# Patient Record
Sex: Male | Born: 1960 | Race: Asian | Hispanic: No | Marital: Married | Smoking: Former smoker
Health system: Southern US, Community
[De-identification: ages and names within clinical notes are randomized; demographics above are authoritative.]

## PROBLEM LIST (undated history)

## (undated) DIAGNOSIS — I10 Essential (primary) hypertension: Secondary | ICD-10-CM

## (undated) DIAGNOSIS — E785 Hyperlipidemia, unspecified: Secondary | ICD-10-CM

## (undated) DIAGNOSIS — I214 Non-ST elevation (NSTEMI) myocardial infarction: Secondary | ICD-10-CM

## (undated) DIAGNOSIS — T7840XA Allergy, unspecified, initial encounter: Secondary | ICD-10-CM

## (undated) DIAGNOSIS — E039 Hypothyroidism, unspecified: Principal | ICD-10-CM

## (undated) DIAGNOSIS — N529 Male erectile dysfunction, unspecified: Secondary | ICD-10-CM

## (undated) DIAGNOSIS — C3490 Malignant neoplasm of unspecified part of unspecified bronchus or lung: Secondary | ICD-10-CM

## (undated) DIAGNOSIS — I2510 Atherosclerotic heart disease of native coronary artery without angina pectoris: Secondary | ICD-10-CM

## (undated) DIAGNOSIS — J189 Pneumonia, unspecified organism: Secondary | ICD-10-CM

## (undated) DIAGNOSIS — G5690 Unspecified mononeuropathy of unspecified upper limb: Secondary | ICD-10-CM

## (undated) DIAGNOSIS — K760 Fatty (change of) liver, not elsewhere classified: Secondary | ICD-10-CM

## (undated) DIAGNOSIS — I255 Ischemic cardiomyopathy: Secondary | ICD-10-CM

## (undated) HISTORY — DX: Unspecified mononeuropathy of unspecified upper limb: G56.90

## (undated) HISTORY — DX: Hyperlipidemia, unspecified: E78.5

## (undated) HISTORY — DX: Pneumonia, unspecified organism: J18.9

## (undated) HISTORY — DX: Allergy, unspecified, initial encounter: T78.40XA

## (undated) HISTORY — PX: OTHER SURGICAL HISTORY: SHX169

## (undated) HISTORY — DX: Fatty (change of) liver, not elsewhere classified: K76.0

## (undated) HISTORY — DX: Male erectile dysfunction, unspecified: N52.9

## (undated) HISTORY — DX: Hypothyroidism, unspecified: E03.9

---

## 2007-08-19 ENCOUNTER — Emergency Department (HOSPITAL_COMMUNITY): Admission: EM | Admit: 2007-08-19 | Discharge: 2007-08-20 | Payer: Self-pay | Admitting: Emergency Medicine

## 2007-12-04 ENCOUNTER — Emergency Department (HOSPITAL_COMMUNITY): Admission: EM | Admit: 2007-12-04 | Discharge: 2007-12-04 | Payer: Self-pay | Admitting: Emergency Medicine

## 2010-04-18 ENCOUNTER — Encounter: Admission: RE | Admit: 2010-04-18 | Discharge: 2010-04-18 | Payer: Self-pay | Admitting: Internal Medicine

## 2010-09-05 ENCOUNTER — Encounter
Admission: RE | Admit: 2010-09-05 | Discharge: 2010-09-05 | Payer: Self-pay | Source: Home / Self Care | Attending: Internal Medicine | Admitting: Internal Medicine

## 2010-09-05 ENCOUNTER — Encounter: Payer: Self-pay | Admitting: Internal Medicine

## 2010-10-03 ENCOUNTER — Encounter: Payer: Self-pay | Admitting: Internal Medicine

## 2010-10-24 ENCOUNTER — Other Ambulatory Visit: Payer: Self-pay

## 2010-10-24 ENCOUNTER — Other Ambulatory Visit: Payer: Self-pay | Admitting: Internal Medicine

## 2010-10-24 ENCOUNTER — Encounter: Payer: Self-pay | Admitting: Internal Medicine

## 2010-10-24 ENCOUNTER — Institutional Professional Consult (permissible substitution) (INDEPENDENT_AMBULATORY_CARE_PROVIDER_SITE_OTHER): Payer: Self-pay | Admitting: Internal Medicine

## 2010-10-24 ENCOUNTER — Encounter (INDEPENDENT_AMBULATORY_CARE_PROVIDER_SITE_OTHER): Payer: Self-pay | Admitting: *Deleted

## 2010-10-24 DIAGNOSIS — R519 Headache, unspecified: Secondary | ICD-10-CM | POA: Insufficient documentation

## 2010-10-24 DIAGNOSIS — M542 Cervicalgia: Secondary | ICD-10-CM

## 2010-10-24 DIAGNOSIS — R51 Headache: Secondary | ICD-10-CM | POA: Insufficient documentation

## 2010-10-24 DIAGNOSIS — J381 Polyp of vocal cord and larynx: Secondary | ICD-10-CM | POA: Insufficient documentation

## 2010-10-24 DIAGNOSIS — R042 Hemoptysis: Secondary | ICD-10-CM

## 2010-10-24 DIAGNOSIS — F17200 Nicotine dependence, unspecified, uncomplicated: Secondary | ICD-10-CM

## 2010-10-24 DIAGNOSIS — E119 Type 2 diabetes mellitus without complications: Secondary | ICD-10-CM | POA: Insufficient documentation

## 2010-10-25 ENCOUNTER — Encounter: Payer: Self-pay | Admitting: Internal Medicine

## 2010-10-25 ENCOUNTER — Ambulatory Visit
Admission: RE | Admit: 2010-10-25 | Discharge: 2010-10-25 | Disposition: A | Discharge status: Home / Self Care | Payer: Self-pay | Source: Ambulatory Visit | Attending: Internal Medicine | Admitting: Internal Medicine

## 2010-10-25 ENCOUNTER — Ambulatory Visit (INDEPENDENT_AMBULATORY_CARE_PROVIDER_SITE_OTHER)
Admission: RE | Admit: 2010-10-25 | Discharge: 2010-10-25 | Disposition: A | Discharge status: Home / Self Care | Payer: Self-pay | Source: Ambulatory Visit | Attending: Internal Medicine | Admitting: Internal Medicine

## 2010-10-25 DIAGNOSIS — M542 Cervicalgia: Secondary | ICD-10-CM

## 2010-10-25 DIAGNOSIS — R042 Hemoptysis: Secondary | ICD-10-CM

## 2010-10-25 MED ORDER — IOHEXOL 300 MG/ML  SOLN
80.0000 mL | Freq: Once | INTRAMUSCULAR | Status: AC | PRN
  Administered 2010-10-25: 80 mL via INTRAVENOUS

## 2010-10-28 ENCOUNTER — Ambulatory Visit (INDEPENDENT_AMBULATORY_CARE_PROVIDER_SITE_OTHER): Payer: Self-pay | Admitting: Internal Medicine

## 2010-10-28 ENCOUNTER — Encounter: Payer: Self-pay | Admitting: Internal Medicine

## 2010-10-28 ENCOUNTER — Inpatient Hospital Stay (HOSPITAL_COMMUNITY)
Admission: AD | Admit: 2010-10-28 | Discharge: 2010-11-04 | DRG: 180 | Disposition: A | Discharge status: Home / Self Care | Payer: Medicaid Other | Source: Ambulatory Visit | Attending: Emergency Medicine | Admitting: Emergency Medicine

## 2010-10-28 DIAGNOSIS — Z79899 Other long term (current) drug therapy: Secondary | ICD-10-CM

## 2010-10-28 DIAGNOSIS — R042 Hemoptysis: Secondary | ICD-10-CM

## 2010-10-28 DIAGNOSIS — M542 Cervicalgia: Secondary | ICD-10-CM

## 2010-10-28 DIAGNOSIS — I2510 Atherosclerotic heart disease of native coronary artery without angina pectoris: Secondary | ICD-10-CM | POA: Diagnosis present

## 2010-10-28 DIAGNOSIS — C3400 Malignant neoplasm of unspecified main bronchus: Principal | ICD-10-CM | POA: Diagnosis present

## 2010-10-28 DIAGNOSIS — I7090 Unspecified atherosclerosis: Secondary | ICD-10-CM | POA: Insufficient documentation

## 2010-10-28 DIAGNOSIS — K5900 Constipation, unspecified: Secondary | ICD-10-CM | POA: Diagnosis present

## 2010-10-28 DIAGNOSIS — J189 Pneumonia, unspecified organism: Secondary | ICD-10-CM | POA: Diagnosis present

## 2010-10-28 DIAGNOSIS — R222 Localized swelling, mass and lump, trunk: Secondary | ICD-10-CM

## 2010-10-28 DIAGNOSIS — R51 Headache: Secondary | ICD-10-CM

## 2010-10-28 DIAGNOSIS — I10 Essential (primary) hypertension: Secondary | ICD-10-CM | POA: Diagnosis present

## 2010-10-28 DIAGNOSIS — M4802 Spinal stenosis, cervical region: Secondary | ICD-10-CM | POA: Diagnosis present

## 2010-10-28 DIAGNOSIS — E119 Type 2 diabetes mellitus without complications: Secondary | ICD-10-CM | POA: Diagnosis present

## 2010-10-28 DIAGNOSIS — Z87891 Personal history of nicotine dependence: Secondary | ICD-10-CM

## 2010-10-28 LAB — GLUCOSE, CAPILLARY
Glucose-Capillary: 165 mg/dL — ABNORMAL HIGH (ref 70–99)
Glucose-Capillary: 161 mg/dL — ABNORMAL HIGH (ref 70–99)

## 2010-10-28 LAB — COMPREHENSIVE METABOLIC PANEL
Albumin: 4.1 g/dL (ref 3.5–5.2)
Alkaline Phosphatase: 51 U/L (ref 39–117)
BUN: 13 mg/dL (ref 6–23)
Calcium: 9 mg/dL (ref 8.4–10.5)
Creatinine, Ser: 0.68 mg/dL (ref 0.4–1.5)
GFR calc non Af Amer: >60 mL/min (ref >60)
Potassium: 4.1 mEq/L (ref 3.5–5.1)
Sodium: 138 mEq/L (ref 135–145)

## 2010-10-28 LAB — CBC
HCT: 42 % (ref 39.0–52.0)
Hemoglobin: 14.2 g/dL (ref 13.0–17.0)
Platelets: 250 10*3/uL (ref 150–400)
WBC: 12.1 10*3/uL — ABNORMAL HIGH (ref 4.0–10.5)

## 2010-10-28 LAB — DIFFERENTIAL
Basophils Absolute: 0.1 10*3/uL (ref 0.0–0.1)
Eosinophils Relative: 4 % (ref 0–5)
Lymphocytes Relative: 25 % (ref 12–46)
Lymphs Abs: 3 10*3/uL (ref 0.7–4.0)
Monocytes Absolute: 0.7 10*3/uL (ref 0.1–1.0)

## 2010-10-28 LAB — APTT: aPTT: 33 seconds (ref 24–37)

## 2010-10-28 LAB — PROTIME-INR
INR: 0.97 (ref 0.00–1.49)
Prothrombin Time: 13.1 seconds (ref 11.6–15.2)

## 2010-10-29 ENCOUNTER — Other Ambulatory Visit: Payer: Self-pay | Admitting: Internal Medicine

## 2010-10-29 LAB — GLUCOSE, CAPILLARY

## 2010-10-29 LAB — EXPECTORATED SPUTUM ASSESSMENT W GRAM STAIN, RFLX TO RESP C

## 2010-10-29 LAB — URINALYSIS, ROUTINE W REFLEX MICROSCOPIC
Glucose, UA: 100 mg/dL — AB
Ketones, ur: NEGATIVE mg/dL
Urobilinogen, UA: 0.2 mg/dL (ref 0.0–1.0)
pH: 6.5 (ref 5.0–8.0)

## 2010-10-30 LAB — GLUCOSE, CAPILLARY
Glucose-Capillary: 147 mg/dL — ABNORMAL HIGH (ref 70–99)
Glucose-Capillary: 158 mg/dL — ABNORMAL HIGH (ref 70–99)
Glucose-Capillary: 220 mg/dL — ABNORMAL HIGH (ref 70–99)
Glucose-Capillary: 172 mg/dL — ABNORMAL HIGH (ref 70–99)

## 2010-10-31 ENCOUNTER — Other Ambulatory Visit: Payer: Self-pay | Admitting: Internal Medicine

## 2010-10-31 ENCOUNTER — Ambulatory Visit: Payer: Self-pay | Admitting: Internal Medicine

## 2010-10-31 ENCOUNTER — Inpatient Hospital Stay (HOSPITAL_COMMUNITY): Payer: Medicaid Other

## 2010-10-31 LAB — BASIC METABOLIC PANEL
BUN: 11 mg/dL (ref 6–23)
Calcium: 9 mg/dL (ref 8.4–10.5)
Chloride: 103 mEq/L (ref 96–112)
Creatinine, Ser: 0.8 mg/dL (ref 0.4–1.5)
GFR calc Af Amer: >60 mL/min (ref >60)
GFR calc non Af Amer: >60 mL/min (ref >60)

## 2010-10-31 LAB — CBC
MCH: 29.6 pg (ref 26.0–34.0)
MCHC: 34.2 g/dL (ref 30.0–36.0)
MCV: 86.5 fL (ref 78.0–100.0)
Platelets: 239 10*3/uL (ref 150–400)
RDW: 13.1 % (ref 11.5–15.5)

## 2010-10-31 LAB — PROTIME-INR: Prothrombin Time: 12.7 seconds (ref 11.6–15.2)

## 2010-10-31 LAB — GLUCOSE, CAPILLARY: Glucose-Capillary: 143 mg/dL — ABNORMAL HIGH (ref 70–99)

## 2010-10-31 NOTE — H&P (Signed)
NAMEATHANASIOS, HELDMAN                     ACCOUNT NO.:  [id number]  MEDICAL RECORD NO.:  [id number]           PATIENT TYPE:  I  LOCATION:  5013                         FACILITY:  [facility]  PHYSICIAN:  Saraphina Lauderbaugh D. Maple Hudson, MD, FCCP, FACPDATE OF BIRTH:  04[birth year]08[birth year]1961  DATE OF ADMISSION:  10/28/2010 DATE OF DISCHARGE:                             HISTORY & PHYSICAL   ADMITTING DIAGNOSES: 1. Hemoptysis. 2. Lung mass. 3. Facial pain.  HISTORY OF PRESENT ILLNESS:  This 50 year old [place] was seen on first visit, coming with his nephew who serves as Nurse, learning disability on October 24, 2010, with complaint of 3-4 months of intermittent cough with blood-streaked sputum and additional complaint of pain at the right anterior neck, extending up the right side of his face.  He had been treating these problems with traditional hot-cupping therapy, leaving burn marks on his back.  He had had an ear, nose, and throat evaluation by Dr. Narda Bonds who found an incidental vocal cord polyp and two papillomas on the soft palate, which were not thought to be bleeding source.  Chest x-ray as of September 15, 2010, had been normal.  Other laboratory was significant for elevated glucose in this diabetic, controlled on oral agents.  At that hospital visit with me, sputum was obtained for cytology, returning nondiagnostic.  Tramadol was given for pain, which has subsequently not provided pain relief.  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  CT scan of the neck showed arterial calcification generally, but also at the carotid bulb on the right with cervical spine stenosis at levels C2 through C4, but no direct tumor invasion in that area to explain the pain.  He has returned to the office today complaining of persistent pain and heavier bleeding, having coughed up two cups of red  blood over the last 24 hours.  He is admitted for stabilization and for further diagnostic evaluation as well as pain control.  HOME MEDICATIONS: 1. Tylenol 325 mg q.i.d. p.r.n. 2. Metformin hydrochloride 1000 mg b.i.d. 3. Tramadol 50 mg t.i.d. p.r.n. pain.  PAST MEDICAL HISTORY: 1. Diabetes mellitus type 2, adult-onset, non-insulin dependent. 2. Vocal cord polyp. 3. Recent hemoptysis. 4. Pain. 5. Tobacco user. 6. Atherosclerosis. 7. Cervical spine stenosis, C2 through C4.  FAMILY HISTORY:  He has no information about extended family who are all still in [place].  His nephew is with him.  SOCIAL HISTORY:  Married, [place], he has children. Working as a Investment banker, operational.  Current smoker since age 28, averaging one-half pack per day.  No alcohol.  He is with his nephew, Juleen Starr.  Mr. Maxie Better serves as a Nurse, learning disability.  VACCINATION/IMMUNIZATION HISTORY:  He says PPD has been negative in the past, and he is not aware of tuberculosis exposure.  He thinks he had a flu shot this year, but is unsure about pneumonia vaccine.  PAST SURGICAL HISTORY:  Unknown.  OBJECTIVE:  VITAL SIGNS:  This is a 50 year old man, height  67 inches, weight 162.38 pounds, oxygen saturation 94% on room air, pulse 74 and regular, and BP 162/100 left arm. GENERAL:  He appears to be stressed and uncomfortable.  He makes an attempt to be cooperative, recognizing significant language barrier, but appears to be alert and oriented. SKIN:  Circular burn marks of varying ages from hot cupping on his back and arms. ADENOPATHY:  None found at the cervical, supraclavicular, or axillary areas. HEENT:  Atraumatic.  Extraocular muscles are intact.  Gross hearing and vision appear intact.  Pupils are equal, round, and reactive to light and accommodation.  Conjunctivae are clear.  Tympanic membranes are clear.  Nose is clear.  Pharynx is clear.  Incidental small papilloma noted on the right soft palate.  Several  missing and stained teeth. NECK:  Normal movement and flexibility.  Neck veins are not distended. Carotid impulses are normal without bruits.  Thyroid is normal to palpation.  He indicates an area that is the focus of pain, at the right midlateral neck, approximately the area of the carotid bulb, but it is not particularly tender to gentle palpation and I feel no mass. CHEST:  Crackles in both bases with a few rhonchi, unlabored on the right.  He happens not to be coughing at all while he was in my exam room today and produced no blood. HEART:  Regular rhythm.  No murmur, gallop, or rub heard. ABDOMEN:  Soft and nontender.  Bowel sounds are normal.  I do not feel any enlargement of liver or spleen. GENITALIA AND RECTAL:  Not examined at this time, noncontributory. EXTREMITIES:  Warm with normal pulses.  No edema, cyanosis, or clubbing. NEUROLOGIC:  He moves all extremities, ambulates normally, and is grossly intact.  LABORATORY DATA:  Sputum cytology from October 25, 2010, showed atypical squamous cells and neutrophils.  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  There was postobstructive pneumonitis involving the right lower and right middle lobes and age-advanced coronary artery atherosclerosis.  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  IMPRESSION: 1. 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  Obstruction and collapse of the     distal right middle and lower lobe lung parenchyma are probably     imminent.  Hemoptysis is secondary to erosion of this cancer and     was worse over the last 24 hours.  Especially, given the weekend     and the communication barrier, it is appropriate to admit Mr. Laveda Norman     for inpatient care.  This will also allow [place] to set up bronchoscopy     at the first of the week. 2. His right neck and facial pain started about the same time, but     direct relation to his cancer is not yet apparent.  Tramadol has     been insufficient, so we will adjust analgesic  coverage as needed.     As we progress, I anticipate involving Oncology in his care     planning. 3. There is evidence of postobstructive pneumonia.  I will cover for     now as part of an effort to stabilize his respiratory problems by     utilizing Rocephin and Zithromax.  Because he is actively bleeding,     I will not provide pharmacological DVT prophylaxis and instead will     use PAS hose for now.     Tyquisha Sharps D. Maple Hudson, MD, FCCP, FACP     CDY/MEDQ  D:  10/28/2010  T:  10/29/2010  Job:  [id number]  cc:   Windle Guard, M.D.  Electronically Signed by Jetty Duhamel MD FCCP FACP on 10/31/2010 09:24:36 PM

## 2010-11-01 ENCOUNTER — Inpatient Hospital Stay (HOSPITAL_COMMUNITY): Payer: Medicaid Other

## 2010-11-01 DIAGNOSIS — C3490 Malignant neoplasm of unspecified part of unspecified bronchus or lung: Secondary | ICD-10-CM

## 2010-11-01 LAB — CULTURE, RESPIRATORY W GRAM STAIN: Culture: NORMAL

## 2010-11-01 LAB — GLUCOSE, CAPILLARY
Glucose-Capillary: 115 mg/dL — ABNORMAL HIGH (ref 70–99)
Glucose-Capillary: 174 mg/dL — ABNORMAL HIGH (ref 70–99)
Glucose-Capillary: 149 mg/dL — ABNORMAL HIGH (ref 70–99)

## 2010-11-01 MED ORDER — IOHEXOL 300 MG/ML  SOLN
100.0000 mL | Freq: Once | INTRAMUSCULAR | Status: AC | PRN
  Administered 2010-11-01: 100 mL via INTRAVENOUS

## 2010-11-02 ENCOUNTER — Inpatient Hospital Stay (HOSPITAL_COMMUNITY): Payer: Medicaid Other

## 2010-11-02 LAB — GLUCOSE, CAPILLARY
Glucose-Capillary: 172 mg/dL — ABNORMAL HIGH (ref 70–99)
Glucose-Capillary: 208 mg/dL — ABNORMAL HIGH (ref 70–99)
Glucose-Capillary: 225 mg/dL — ABNORMAL HIGH (ref 70–99)

## 2010-11-03 ENCOUNTER — Ambulatory Visit (HOSPITAL_COMMUNITY): Payer: Medicaid Other | Attending: Oncology

## 2010-11-03 ENCOUNTER — Encounter (HOSPITAL_COMMUNITY): Payer: Self-pay | Admitting: Radiology

## 2010-11-03 ENCOUNTER — Ambulatory Visit: Payer: Self-pay | Admitting: Internal Medicine

## 2010-11-03 DIAGNOSIS — R222 Localized swelling, mass and lump, trunk: Secondary | ICD-10-CM

## 2010-11-03 DIAGNOSIS — C771 Secondary and unspecified malignant neoplasm of intrathoracic lymph nodes: Secondary | ICD-10-CM | POA: Insufficient documentation

## 2010-11-03 DIAGNOSIS — C3400 Malignant neoplasm of unspecified main bronchus: Secondary | ICD-10-CM | POA: Insufficient documentation

## 2010-11-03 LAB — GLUCOSE, CAPILLARY
Glucose-Capillary: 149 mg/dL — ABNORMAL HIGH (ref 70–99)
Glucose-Capillary: 193 mg/dL — ABNORMAL HIGH (ref 70–99)

## 2010-11-03 MED ORDER — FLUDEOXYGLUCOSE F - 18 (FDG) INJECTION
18.1000 | Freq: Once | INTRAVENOUS | Status: AC | PRN
  Administered 2010-11-03: 18.1 via INTRAVENOUS

## 2010-11-03 NOTE — Letter (Signed)
Summary: Narda Bonds MD  Narda Bonds MD   Imported By: Lennie Odor 10/26/2010 13:51:14  _____________________________________________________________________  External Attachment:    Type:   Image     Comment:   External Document

## 2010-11-03 NOTE — Letter (Signed)
Summary: [employer]  [address]   [place], [place] [address]   Phone: [phone]  Fax: [phone]    Today's Date: October 24, 2010  Name of Patient: George Griffin  The above named patient had a medical visit today at:  1:30pm.  Please take this into consideration when reviewing the time away from work/school.    Special Instructions: Pt also has an appt on 10-25-2010 at 3pm  and 10-31-2010 at 11am.   [  ] None  [  ] To be off the remainder of today, returning to the normal work / school schedule tomorrow.  [  ] To be off until the next scheduled appointment on ______________________.  [  ] Other ________________________________________________________________ ________________________________________________________________________   Sincerely yours,     Jason Coop

## 2010-11-03 NOTE — Assessment & Plan Note (Signed)
Summary: coughing blood/mhh   Visit Type:  Initial Consult Copy to:  Dr. Deniece Ree Primary Provider/Referring Provider:  Dr. Deniece Ree  CC:  Hemoptysis.  History of Present Illness: October 24, 2010-   Current Medications (verified): 1)  Tylenol 325 Mg Tabs (Acetaminophen) .... Per Bottle Directions As Needed 2)  Metformin Hcl 1000 Mg Tabs (Metformin Hcl) .Marland Kitchen.. 1 Two Times A Day  Allergies (verified): No Known Drug Allergies  Past History:  Past Medical History: Current Problems:  DM (ICD-250.00) VOCAL CORD POLYP (ICD-478.4) HEMOPTYSIS UNSPECIFIED (ICD-786.30)  Social History: Married Children Current smoker since age 43.  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 No obvious source on ENT exam. The patient thinks blood is coming from his head, saying sometimes it comes from his nose, but pointing to the area that hurts in right neck. If it can be financed, I would like to get contrast CT of neck and chest.  Orders: Consultation Level III (29562) T- * Misc. Laboratory test [phone]) Radiology Referral (Radiology)  Problem # 2:  FACIAL PAIN (ICD-784.0)  Cephalgia with suggestion it following nerve distribution, perhaps from area of his carotid in right neck. I will give Rx for tramadol to help cough and pain, and ask he stop Advil.  His updated medication list for this problem includes:    Tylenol 325 Mg Tabs (Acetaminophen) .Marland Kitchen... Per bottle directions as needed    Tramadol Hcl 50 Mg Tabs (Tramadol hcl) .Marland Kitchen... 1, 3 tim3s daily as needed for  pain  Orders: Consultation Level III ([id number]) Radiology Referral (Radiology)  Problem # 3:  TOBACCO ABUSE (ICD-305.1)  Encouraged to stop smoking.l   Orders: Consultation Level III (96295) T- * Misc. Laboratory test [phone]) Radiology Referral (Radiology)  Medications Added to Medication List This Visit: 1)  Tylenol 325 Mg Tabs (Acetaminophen) .... Per bottle directions as needed 2)  Metformin Hcl 1000 Mg Tabs (Metformin hcl) .Marland Kitchen.. 1 two times a day 3)  Tramadol Hcl 50 Mg Tabs (Tramadol hcl) .... 1, 3 tim3s daily as needed for pain  Patient Instructions: 1)  Please schedule a follow-up appointment in 1 week. 2)  See [facility] to schedule CT scans 3)  A Chest CT with Contrast has been recommended.  Your imaging study may require preauthorization.  4)  Stop Advil, Stop smoking 5)  Script for Tramadol for pain 6)  We will send sputum to lab for cytology Prescriptions: TRAMADOL HCL 50 MG TABS (TRAMADOL HCL) 1, 3 tim3s daily as needed for pain  #25 x 1   Entered and Authorized by:   Waymon Budge MD   Signed by:   Waymon Budge MD on 10/24/2010   Method used:   Print then Give to Patient   RxID:   [id number]   Appended Document: Orders Update    Clinical Lists Changes  Orders: Added new Test order of T- * Misc.  Laboratory test [phone]) - Signed      Appended Document: coughing blood/mhh     Copy to:  Dr Windle Guard Primary Provider/Referring Provider:  Dr. 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  Originally from [place], has been in [place] 22 years, but speaks very limited [place]. Lifelong smoker, now at 10 cigarettes/ day. Denies Hx TB and says PPD negative. Denies prior lung disease.  Now for 3-4 months he has had cough intermittently producing clear to yellow mucus and blood streaks. Denies fever, night sweats, chest pain, palpitation, weight loss, other  bleeding, dyspnea, dysphagia or nodes.  At about the same time he bgan noting pain focused in the right anterior neck, lateral to the midtrachea. Pain is intermittent, several times daily, and extending up the side of his head, sometimes into the right eye without visual change and into the right ear without hearing change. He has been taking "a lot" of Advil for this pain. He has also been doing traditional therapy with hot cupping which leaves numerous burn marks on his back and arms. ENT eval by Dr Ezzard Standing found incidental vocal cord polyp and 2 papillomas on soft palate. These were thought to be benign and not the bleeding source.  CXR 09/05/10 was WNL. CBC and BMET 10/04/10 were normal except glucose 144- known DM on oral Rx.  He coughs sample bloody mucoid sputum which is sent for cytology.   Preventive Screening-Counseling & Management  Alcohol-Tobacco     Smoking Status: current     Smoke Cessation Stage: precontemplative     Packs/Day: 0.25     Tobacco Counseling: to quit use of tobacco products  Allergies: No Known Drug Allergies  Past History:  Family History: Last updated: 10/24/2010 He has no information  Social History: Last updated: 10/24/2010 Married Children Current smoker since age 13.  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  The patient denies shortness of breath with activity, shortness of breath at rest, non-productive cough, chest pain, irregular heartbeats, acid heartburn, indigestion, loss of appetite, weight  change, abdominal pain, difficulty swallowing, sore throat, tooth/dental problems, headaches, nasal congestion/difficulty breathing through nose, sneezing, ear ache, rash, and fever.         Facial pain/ carotidynia per HPI  Physical Exam  Additional Exam:  Vital Signs: Patient profile:   50 year old male Height:      67 inches Weight:      166.13 pounds BMI:     26.11 O2 Sat:      94 % on Room air Pulse rate:   88 / minute BP sitting:   130 / 80  (left arm)  Vitals Entered By: Vernie Murders (October 24, 2010 1:41 PM)  O2 Flow:  Room air  PHYSICAL EXAM: General: A/Ox3; pleasant and cooperative, NAD, SKIN: Circular burn marks of varying age from hot cupping on back and arms NODES: no lymphadenopathy HEENT: [place]/AT, EOM- WNL, Conjuctivae- clear,  PERRLA, TM-WNL, Nose- clear, Throat- clear and wnl, throat clearing. Missing teeth. Incidental small papilloma on right soft palate NECK: Supple w/ fair ROM, JVD- none, normal carotid impulses w/o bruits Thyroid- normal to palpation. Points to mid right neck as source of pain.  Not tender, no mass, bruit or pulsation.  CHEST: crackles both bases, unlabored HEART: RRR, no m/g/r heard ABDOMEN: Soft and nl; nml bowel sounds; no organomegaly or masses noted ZOX:WRUE, nl pulses, no edema, cyanosis or clubbing NEURO: Grossly intact to observation      Impression & Recommendations:  Impression & Recommendations:  Problem # 1:  HEMOPTYSIS UNSPECIFIED (ICD-786.30)  Hemoptysis x 3-4 months in smoker taking a lots of Advil. No obvious source on ENT exam. The patient thinks blood is coming from his head, saying sometimes it comes from his nose, but pointing to the area that hurts in right neck. If it can be financed, I would like to get contrast CT of neck and chest.  Orders: Consultation Level III (45409) T- * Misc. Laboratory test [phone]) Radiology Referral (Radiology)  Problem # 2:  FACIAL PAIN (ICD-784.0)  Cephalgia with suggestion it  following nerve distribution, perhaps from area of his carotid in right neck. I will give Rx for tramadol to help cough and pain, and ask he stop Advil.  His updated medication list for this problem includes:    Tylenol 325 Mg Tabs (Acetaminophen) .Marland Kitchen... Per bottle directions as needed    Tramadol Hcl 50 Mg Tabs (Tramadol hcl) .Marland Kitchen... 1, 3 tim3s daily as needed for pain  Orders: Consultation Level III ([id number]) Radiology Referral (Radiology)  Problem # 3:  TOBACCO ABUSE (ICD-305.1)  Encouraged to stop smoking.l   Orders: Consultation Level III (56213) T- * Misc. Laboratory test [phone]) Radiology Referral (Radiology)  Medications Added to Medication List This Visit: 1)  Tylenol 325 Mg Tabs (Acetaminophen) .... Per bottle directions as needed 2)  Metformin Hcl 1000 Mg Tabs (Metformin hcl) .Marland Kitchen.. 1 two times a day 3)  Tramadol Hcl 50 Mg Tabs (Tramadol hcl) .... 1, 3 tim3s daily as needed for pain  Patient Instructions: 1)  Please schedule a follow-up appointment in 1 week. 2)  See [facility] to schedule CT scans 3)  A Chest CT with Contrast has been recommended.  Your imaging study may require preauthorization.  4)  Stop Advil, Stop smoking 5)  Script for Tramadol for pain 6)  We will send sputum to lab for cytology Prescriptions: TRAMADOL HCL 50 MG TABS (TRAMADOL HCL) 1, 3 tim3s daily as needed for pain  #25 x 1   Entered and Authorized by:   Waymon Budge MD   Signed by:   Rennis Chris You

## 2010-11-03 NOTE — Assessment & Plan Note (Signed)
Summary: 1 week follow up visit/per CDY/kcw  RESCHED TODAY PER KATIE/LED   Copy to:  Dr Windle Guard Primary Provider/Referring Provider:  Dr. Windle Guard  CC:  Hemoptysis-rich red blood;Increased pain in face; coughing up approx 2 cups of blood; dizzy and lightheaded feelings..  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  Originally from [place], has been in [place] 22 years, but speaks very limited [place]. Lifelong smoker, now at 10 cigarettes/ day. Denies Hx TB and says PPD negative. Denies prior lung disease.  Now for 3-4 months he has had cough intermittently producing clear to yellow mucus and blood streaks. Denies fever, night sweats, chest pain, palpitation, weight loss, other bleeding, dyspnea, dysphagia or nodes.  At about the same time he bgan noting pain focused in the right anterior neck, lateral to the midtrachea. Pain is intermittent, several times daily, and extending up the side of his head, sometimes into the right eye without visual change and into the right ear without hearing change. He has been taking "a lot" of Advil for this pain. He has also been doing traditional therapy with hot cupping which leaves numerous burn marks on his back and arms. ENT eval by Dr Ezzard Standing found incidental vocal cord polyp and 2 papillomas on soft palate. These were thought to be benign and not the bleeding source.  CXR 09/05/10 was WNL. CBC and BMET 10/04/10 were normal except glucose 144- known DM on oral Rx.  He coughs sample bloody mucoid sputum which is sent for cytology.   October 28, 2010- Right facial pain, hemoptysis, lung mass- nephew with him Nurse-CC: Hemoptysis-rich red blood;Increased pain in face; coughing up approx 2 cups of blood; dizzy, lightheaded feelings. Heavier bleeding  began yesterday evening. Light headed, dizzy. Tramadol  did not relieve pain which is still mainly in right neck and face. CT chest- mass c/w bronchogenic ca RLL into carina and hilar nodes and bronchus intermedius. CT neck- arterial calcification at carotic bnulb and generally. Cervical stenosis.     Preventive Screening-Counseling & Management  Alcohol-Tobacco     Smoking Status: quit     Smoke Cessation Stage: precontemplative     Packs/Day: 0.25     Year Quit: 5 days ago     Tobacco Counseling: to quit use of tobacco products  Current Medications (verified): 1)  Tylenol 325 Mg Tabs (Acetaminophen) .... Per Bottle Directions As Needed 2)  Metformin Hcl 1000 Mg Tabs (Metformin Hcl) .Marland Kitchen.. 1 Two Times A Day 3)  Tramadol Hcl 50 Mg Tabs (Tramadol Hcl) .... 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  Smoked 1/2 ppd. 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  Smoked 1/2 ppd. 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   Physical Exam  Additional Exam:   PHYSICAL EXAM: General: A/Ox3; pleasant and cooperative, NAD, but tired and stressed looking SKIN: Circular burn marks of varying age from hot cupping on back and arms NODES: no lymphadenopathy HEENT: [place]/AT, EOM- WNL, Conjuctivae- clear, PERRLA, TM-WNL, Nose- clear, Throat- clear and wnl, throat clearing. Missing teeth. Incidental small papilloma on right soft palate NECK: Supple w/ fair ROM, JVD- none, normal carotid impulses w/o bruits Thyroid- normal to palpation. Points to mid right neck as source of pain.  Not tender, no mass, bruit or pulsation.  CHEST: crackles both bases, few rhonchi on right, unlabored HEART: RRR, no m/g/r heard ABDOMEN: Soft and nl; nml bowel sounds; no organomegaly or masses noted EXB:MWUX, nl pulses, no edema, cyanosis or clubbing NEURO: Grossly intact to observation      Impression & Recommendations:  Problem # 1:  MASS, LUNG (ICD-786.6) By chest CT this is a bronchogenic cancer in Right mid lung, invading hilar nosdes and bronchus intermedius, with active hemoptysis and potential for full occlusion of airway.  We wil admit for stabilization of hemoptysis and for apain control and diagnostic management.  Problem # 2:  FACIAL PAIN (ICD-784.0) Pain may be unrelated to the lung mas and mor related to arterial calcification seen near the carotid bulb,, or even to his C2-4 spinal stenosis.  His updated medication list for this problem includes:    Tylenol 325 Mg Tabs (Acetaminophen) .Marland Kitchen... Per bottle directions as needed    Tramadol Hcl 50 Mg Tabs (Tramadol hcl) .Marland Kitchen... 1, 3 tim3s daily as needed for pain  Patient Instructions: 1)  In-patient admission to hpospital. for primary dx hempotpysis, lung mass, pain.

## 2010-11-04 LAB — CBC
HCT: 42.5 % (ref 39.0–52.0)
Hemoglobin: 14.4 g/dL (ref 13.0–17.0)
MCH: 29 pg (ref 26.0–34.0)
MCHC: 33.9 g/dL (ref 30.0–36.0)

## 2010-11-04 LAB — DIFFERENTIAL
Basophils Relative: 1 % (ref 0–1)
Lymphocytes Relative: 19 % (ref 12–46)
Monocytes Absolute: 0.8 10*3/uL (ref 0.1–1.0)
Monocytes Relative: 7 % (ref 3–12)
Neutro Abs: 7.9 10*3/uL — ABNORMAL HIGH (ref 1.7–7.7)

## 2010-11-04 LAB — COMPREHENSIVE METABOLIC PANEL
BUN: 12 mg/dL (ref 6–23)
CO2: 27 mEq/L (ref 19–32)
Calcium: 9.3 mg/dL (ref 8.4–10.5)
Creatinine, Ser: 0.8 mg/dL (ref 0.4–1.5)
GFR calc non Af Amer: >60 mL/min (ref >60)
Glucose, Bld: 167 mg/dL — ABNORMAL HIGH (ref 70–99)

## 2010-11-04 LAB — GLUCOSE, CAPILLARY: Glucose-Capillary: 233 mg/dL — ABNORMAL HIGH (ref 70–99)

## 2010-11-04 NOTE — Consult Note (Signed)
NAMECLEM, WISENBAKER                     ACCOUNT NO.:  [id number]  MEDICAL RECORD NO.:  [id number]           PATIENT TYPE:  I  LOCATION:  5013                         FACILITY:  [facility]  PHYSICIAN:  Jethro Bolus, M.D.          DATE OF BIRTH:  October 16, 1960  DATE OF CONSULTATION:  11/01/2010 DATE OF DISCHARGE:                                CONSULTATION   REFERRING PHYSICIAN:  Clinton D. Young, MD, FCCP, FACP  REASON FOR CONSULTATION:  At least stage IIIA non-small-cell lung cancer; squamous cell subtype.  HISTORY OF PRESENT ILLNESS:  Mr. Hoglund is a 50 year old [place] gentleman who had history of smoking when he was 50 years old between one and two packs a day.  He recently quit smoking the last few days.  He has developing intermittent hemoptysis the past 5-6 months. First, he thought it was problem with tonsil infection as he has had recurrent problem with sore throat.  Despite courses of antibiotics, he still has hemoptysis.  He was eventually referred to Dr. Jetty Duhamel from Pulmonary Service who previously did a chest x-ray on April 18, 2010 and September 05, 2010 which are both negative.  He also did sputum cytology and was reported negative as well.  Given the persistent problem, he was admitted, he underwent a chest CT on October 25, 2010, which I reviewed myself personally, which showed a 3.7 x 3.9-cm right by 4.3 cm right central lower lobe lung mass involving the right lower lobar and segmental bronchi.  He also had reticular nodular opacities within the right middle lobe and right lower lobe consistent with postobstructive pneumonitis.  He still has a 2.3 cm subcarinal adenopathy and right hilar adenopathy.  Given the fact that he has right neck pain, a neck CT was performed on the same day, which showed mucosal thinning of the maxillary sinuses, right more than left.  He has jugular chain lymph node with adenopathy.  He has carotid bulb atherosclerosis. 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  There was no explanations on the CT of the neck to be concern for cancer spreading. Given the problem with hemoptysis, he was admitted to the hospital on October 28, 2010 and underwent on October 31, 2010 a bronchoscopy, which showed the right bronchus which was significant for occlusive tumor approximately 3 cm distal to the main carina.  There was brush from cytology and there is a history of washing and biopsy for transbronchial biopsy technique.  Pathology case number [id number] was consistent with malignant cell present most likely squamous cell carcinoma on cytology and surgical pathology case number [id number] was consistent with squamous cell carcinoma from the right mainstem bronchus, biopsy. Given the language barrier, he was referred to me for evaluations and treatment options.  Mr. Weatherford was seen in the hospital room by himself today.  He reports that he is feeling better when first admitted.  His hemolysis is resolving and is now only lemonade tinge without frank hemoptysis.  He has been a  bit ambulatory inside the hospital room without any shortness of breath or chest pain.  He still has problem with right neck pain radiating to the right occipital area, that is intermittent; however, the symptom is much improved with fentanyl patch and analgesics as needed using oxycodone.  He denies any headache, confusion, diplopia, blurred vision, residual sore throat, "laryngitis or tonsillitis."  He also denies palpable lymph node swelling in the neck, supraclavicular area or swelling of his face, shortness of breath, PND, orthopnea, pedal edema, chest pain, palpitation, abdominal pain, abdominal swelling, nausea, vomiting, hemoptysis, hematemesis, melena, hematochezia, hematuria, heat and cold intolerance, depression, homicidal, suicidal ideations or feeling  hopelessness.  The rest of the 14-point review of systems was negative.  PAST MEDICAL HISTORY: 1. Hypertension. 2. Diabetes mellitus type 2.  PAST SURGICAL HISTORY:  None.  CURRENT INPATIENT MEDICATIONS: 1. Azithromycin 500 mg p.o. daily. 2. Ceftriaxone 1 g IV daily. 3. Fentanyl patch 50 mg per day, change every 3 days. 4. Insulin per sliding scale. 5. Pantoprazole 40 mg p.o. daily. 6. Oxycodone 10 mg p.o. q.3 h. pain breakthrough pain.  ALLERGIES:  No known drug allergy.  SOCIAL HISTORY:  The patient is immigrant from [place].  He left [place] in [place] and arrived in the [place] in 1989.  He has been married twice.  He has four children, age 96 down to 39.  All other children either are independent, all live with their mothers.  He does have a supportive network with most of the children.  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  He reports history of smoking cigarette between one and two packs a day since when he was 14 years off and on; however, quit no more than 6 months at a time and his last smoke was before this admission.  He denies history of alcohol or IV drug use. His face is beautiful.  FAMILY HISTORY:  His parents died from unknown cause.  He has two sisters and four brothers.  Two other sisters had died from unknown cause.  He denies any family history of cancer including lung, colorectal, prostate cancer.  PHYSICAL EXAMINATION:  VITAL SIGNS:  Temperature 98.4 degrees Fahrenheit, heart rate 72, respiratory rate 18, blood pressure is 162/81, O2 sat 96% on room air.  ECOG performance status of 0. GENERAL:  Well-nourished male in no acute distress. HEENT:  There was no scleral icterus.  There was no oropharyngeal lesion.  I did not appreciate any tonsillar fossa, erythema or swelling. LYMPHATICS:  Negative for cervical, postauricular, supraclavicular, axillary, inguinal adenopathy. LUNGS:  Clear bilaterally without wheezing or crackle. CARDIAC:  Regular  rate and rhythm, S1 and S2 without murmur, rum or gallop. ABDOMEN:  Soft, flat, nontender, nondistended without organomegaly. EXTREMITIES:  There was no pedal edema. SKIN:  Did not show any skin rash. NEUROLOGIC:  Cranial nerves II through XII grossly intact.  Motor strength was 5/5 both upper and lower extremities.  Deep tendon reflexes within normal range.  LABORATORY DATA:  WBC 12.7, hemoglobin 14.5, platelet count of 239. Creatinine 0.80.  LFTs on October 28, 2010 was within normal range. Calcium was 9.0.  ASSESSMENT AND PLAN: 1. At least T2a, N1, MX non-cell lung cancer, squamous cell subtype. 2. History of smoking, recently quit. 3. Diabetes mellitus type 2. 4. Hypertension. 5. Right neck and occipital headache. 6. Hemoptysis secondary to lung cancer, resolving. 7. Postobstructive pneumonia.  RECOMMENDATIONS: 1. I discussed with Mr. Kostick that he at least stage III recommend  staging workup.  He does have history of smoking and therefore,     I wonder whether his lung function is good enough for pneumonectomy.     It made the fact that his tumor involving the right mainstem     bronchus, the curative surgical option if he is a candidate for     that is pneumonectomy.  I will get staging workup and PFT before     involving CVTS.  If he is not a candidate for pneumonectomy or has     significant disease, then he would benefit from concurrent     chemoradiation therapy with such regimen as weekly carboplatin and     Taxol.  I discussed with him side effects of his chemo regimen     which include, but limited to nausea, vomiting, cytopenia, hair     loss, mucositis, infusion reaction, pneumonitis, neuropathy, risk     of bleeding, infection, electrolyte abnormality.  He expressed     informed consent and wished to proceed with most aggressive form of     therapy possible that is he can withstand. 2. Staging head CT with and without IV contrast; ordered. 3. PET scan to help  with the treatment planning; ordered. 4. Pulmonary function test with DLCO and spirometry to see if he is     candidate for pneumonectomy; ordered 5. I will discuss the case at the Tumor Board on November 03, 2010. 6. Agree with empiric antibiotic for obstructive pneumonia. 7. Please keep the patient inpatient status until he is declared     whether or not he is a candidate for pneumonectomy as to facilitate     his treatment.     Jethro Bolus, M.D.     HH/MEDQ  D:  11/01/2010  T:  11/02/2010  Job:  [id number]  Electronically Signed by Jethro Bolus MD on 11/04/2010 07:00:44 AM

## 2010-11-07 NOTE — Discharge Summary (Addendum)
George Griffin, George Griffin                     ACCOUNT NO.:  [id number]  MEDICAL RECORD NO.:  [id number]           PATIENT TYPE:  I  LOCATION:  5013                         FACILITY:  [facility]  PHYSICIAN:  Clinton D. Maple Hudson, MD, FCCP, FACPDATE OF BIRTH:  1961/08/13  DATE OF ADMISSION:  10/28/2010 DATE OF DISCHARGE:  11/04/2010                              DISCHARGE SUMMARY   CONSULTANTS: 1. Jethro Bolus, MD 2. Dr. Tyrone Sage.  DISCHARGE DIAGNOSES: 1. Squamous cell lung cancer. 2. Post obstructive pneumonia. 3. Hemoptysis. 4. Diabetes mellitus, type 2. 5. Hypertension. 6. Neck pain. 7. Atherosclerosis. 8. Pain control. 9. Constipation.  LABORATORY DATA:  November 04, 2010, CMP demonstrates sodium 134, potassium 3.3, chloride 97, CO2 of 27, glucose 167, BUN 12, creatinine 0.80. Bilirubin 0.5, alkaline phosphatase 62, AST 18, ALT 23, total protein 7.6, albumin 3.8, calcium 9.3.  November 04, 2010, CBC demonstrates WBC 11.9, hemoglobin 14.4, hematocrit 42.5, platelet count 253, neutrophils 66%, lymphocytes 19%, monocytes 7%, eosinophils 7%, basophils 1.  MICRO DATA:  October 29, 2010, respiratory culture demonstrates normal oropharyngeal flora.  October 29, 2010, sputum demonstrates no acid-fast bacilli seen on preliminary results.  Final results pending.  October 29, 2010, urinalysis is negative.  RADIOLOGIC DATA:  November 01, 2010, CT of the head demonstrates no acute intracranial abnormality and chronic sinusitis.  IMAGING STUDY:  Nuclear med PET scan on November 03, 2010, demonstrates hypermetabolic right hilar mass consistent with primary bronchogenic carcinoma, mass invades the right lower lobe bronchus and there is post obstructive pneumonia/pneumonitis in the right lower lobe and right middle lobe.  Mediastinal metastasis to the subcarinal lymph node. Staging by imaging is T2, N2, M0.  PROCEDURES: 1. PFTs on November 02, 2010, demonstrate moderate obstructive and     restrictive disease with submaximal effort.   Normal diffusion     capacity.  Normal measured total lung capacity.  Insignificant     response bronchodilators. 2. Bronchoscopy on October 31, 2010, by Dr. Maple Hudson, which demonstrates an     occlusive bronchogenic carcinoma of the right main stem bronchus.  HISTORY OF PRESENT ILLNESS:  Mr. Oberman is a 50 year old [place] gentleman with little English speaking ability who had a past medical history of extensive smoking up to 2 packs per day since he was 50 years of age.  He was admitted by Dr. Maple Hudson on October 28, 2010, with 3 to 50-month complaint of intermittent cough with blood-streaked sputum and additional complain of right anterior neck pain.  As an outpatient, Mr. Marken had been treating these problems with hot cupping therapy leaving burn marks on his back.  He did have an ear, nose, and throat evaluation by Dr. Ezzard Standing who found an incidental vocal cord polyp and two papillomas on the soft palate, which were not thought to be the source of bleeding.  A chest x-ray on September 15, 2010, was normal.  He was admitted for tissue diagnosis and hemoptysis.  A CT scan of the chest demonstrated a mass consistent with a bronchogenic carcinoma in the right hilum extending into the carina and hilar nodes and into  the bronchus intermedius.  CT of the neck also demonstrated arterial calcification, but also specific calcification at the carotid bulb on the right with cervical spine stenosis at level C2 through C4.  There was no direct tumor invasion in that area to explain his neck pain.  He was seen in a Pulmonary office on October 28, 2010, with complaints of persistent pain and heavier hemoptysis.  Thus, he was admitted for further diagnostic evaluation and pain control.  Dr. Gaylyn Rong was consulted during hospital course.  He was evaluated by Dr. Gaylyn Rong as well as an evaluation by Dr. Tyrone Sage for possible surgical candidacy.  It was felt that he is not an initial surgical candidate for pneumonectomy  due to the impending obstruction of bronchus intermediate.  Recommended he undergo chemoradiation prior to any surgical intervention.  Pathology from bronchoscopy was consistent with malignant cells most likely squamous cell carcinoma on cytology and surgical pathology was consistent with squamous cell carcinoma of right main stem bronchus biopsy.  HOSPITAL COURSE BY DISCHARGE DIAGNOSES: 1. Squamous cell lung cancer.  Mr. Ashmead is a 50 year old [place]     male who was being worked up as an outpatient for hemoptysis.  With     worsening of symptoms on October 28, 2010, he was admitted for     hemoptysis workup and pain control.  He did undergo a bronchoscopy     on October 31, 2010, which demonstrated squamous cell carcinoma on     biopsy.  He was evaluated by Dr. Gaylyn Rong for further oncologic     recommendations.  He also was evaluated by Dr. Tyrone Sage for     possible surgical candidacy; however, secondary to location and     size of mass, it was recommended that he undergo chemotherapy and     radiation prior to any surgical intervention. 2. Post obstructive pneumonia secondary to squamous cell lung cancer.     Mr. Diop has completed 8 days of IV antibiotics.  At the time of     discharge, he will not be discharged on any further antibiotic     therapy. 3. Hemoptysis.  Currently, this has resolved.  Mr. Elk was admitted     with a 3 to 50-month history of coughing up secretions with streaks     of blood.  Please see above information for further details     regarding hemoptysis. 4. Diabetes mellitus, type 2.  He will continue on Janumet post     discharge. 5. Hypertension.  During the hospital course, Mr. Arneson was initiated     on Cardizem and hydrochlorothiazide; however, at the time of     discharge secondary to cancer diagnosis and bordering low blood     pressures, he will not be discharged on any blood pressure     medication.  Recommended that this be followed up at his      [facility] appointment. 6. Neck pain.  Mr. Stiefel has had a 3 to 50-month history of right-sided     neck pain.  During hospitalization, he did undergo CT of the     cervical spine, which demonstrated C2-C4 stenosis.  There was no     clear source of face and neck pain, thought likely secondary to     cervical spine stenosis. 7. Atherosclerosis. 8. Pain control.  Mr. Orvis was started on oxycodone IR as well as a     fentanyl patch during the hospital course.  He will follow up with  Dr. Gaylyn Rong at the [facility] and also has appointments made     to be establish at [facility].  He will be given pain control     regimen post discharge as well as a bowel regimen. 9. Constipation.  This is in the setting of narcotic use.  He will be     given daily Colace as well as p.r.n. laxative at the time of     discharge.  DISCHARGE INSTRUCTIONS: 1. Activity.  He is instructed to increase activity slowly and as     tolerated. 2. Diabetic diet.  No concentrated sweet. 3. Followup.  He is to follow up with [facility] for his eligibility     appointment on Friday, November 25, 2010, at 2:00 p.m.  He also is     scheduled for [facility] appointment on Tuesday, December 13, 2010,     at 9:15 a.m.  Dr. Gaylyn Rong from Oncology will call Mr. Ysaguirre for an     appointment.  DISCHARGE MEDICATIONS: 1. Fentanyl patch 75 mcg transdermally every 3 days. 2. Oxycodone 5 mg IR tabs, 10 mg by mouth q.3 h. as needed for pain. 3. Cialis 20 mg 1 tablet by mouth daily as needed for sexual     dysfunction. 4. Janumet 1 tablet by mouth twice daily, dosing was 50/1000 mg 5. Tramadol 50 mg 1 tablet by mouth 3 times daily as needed for pain. 6. Tylenol OTC every 6 hours as needed for pain. 7. Colace 100 mg p.o. daily. 8. Lactulose 10 g/15 mL p.o. daily p.r.n. constipation.  DISPOSITION AT THE TIME OF DISCHARGE:  Mr. Wellen has maximum benefit of inpatient therapy.  He is currently medically stable and clear for discharge  pending followup as above.     Canary Brim, NP   ______________________________ Rennis Chris. Maple Hudson, MD, FCCP, FACP    BO/MEDQ  D:  11/04/2010  T:  11/05/2010  Job:  [id number]  cc:   Jethro Bolus, M.D. Dr. Maple Hudson  Electronically Signed by Jetty Duhamel MD [facility] FACP on 11/07/2010 08:09:52 PM Electronically Signed by Canary Brim  on 11/08/2010 09:35:29 AM

## 2010-11-07 NOTE — Op Note (Signed)
  George Griffin, George Griffin                     ACCOUNT NO.:  [id number]  MEDICAL RECORD NO.:  [id number]           PATIENT TYPE:  I  LOCATION:  5013                         FACILITY:  [facility]  PHYSICIAN:  Tomeika Weinmann D. Maple Hudson, MD, FCCP, FACPDATE OF BIRTH:  05[birth year]03-1961  DATE OF PROCEDURE:  10/31/2010 DATE OF DISCHARGE:                              OPERATIVE REPORT   INDICATIONS FOR PROCEDURE:  This is a 50 year old [place] man, long time smoker, who presented with hemoptysis.  CT scan demonstrates a large right hilar mass invading the right mainstem bronchus.  The procedure was performed for tissue diagnosis.  SURGEON:  Temiloluwa Laredo D. Maple Hudson, MD, [facility], FACP  After informed consent including use of a translator, the patient was brought for bronchoscopy in the endoscopy suite.  Sedation was provided with 3 mg of intravenous Versed.  The patient was already wearing a 50- mcg Duragesic patch.  Oxygen was provided to maintain saturation over 90% and cardiac monitor showed sinus rhythm with acceptable blood pressure recorded.  A fiberoptic videoscope was introduced via the right nostril to the level of the vocal cords without difficulty.  The cords moved normally.  The main trachea and main carina were normal.  Brief visualization of the left bronchial tree was unremarkable.  The right bronchial tree was significant for occlusive tumor approximately 3 cm distal to the main carina.  This lesion was washed with saline, brushed for cytology, and then biopsied by standard bronchial biopsy technique. There was brown liquid consistent with old necrotic blood coming from behind the tumor mass.  Active bleeding did not occur, but suctioned secretions were bloody.  The procedure was uncomplicated and tolerated well.  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  Samples are submitted for pathology and cytology evaluation.  He will be returned to his room when  stable.     Tanaia Hawkey D. Maple Hudson, MD, FCCP, FACP     CDY/MEDQ  D:  10/31/2010  T:  11/01/2010  Job:  [id number]  cc:   Windle Guard, M.D.  Electronically Signed by Jetty Duhamel MD FCCP FACP on 11/07/2010 08:09:29 PM

## 2010-11-08 ENCOUNTER — Other Ambulatory Visit: Payer: Self-pay | Admitting: Oncology

## 2010-11-08 ENCOUNTER — Encounter (HOSPITAL_BASED_OUTPATIENT_CLINIC_OR_DEPARTMENT_OTHER): Payer: Medicaid Other | Admitting: Oncology

## 2010-11-08 DIAGNOSIS — M542 Cervicalgia: Secondary | ICD-10-CM

## 2010-11-08 DIAGNOSIS — C3400 Malignant neoplasm of unspecified main bronchus: Secondary | ICD-10-CM

## 2010-11-08 LAB — COMPREHENSIVE METABOLIC PANEL
ALT: 14 U/L (ref 0–53)
Albumin: 4.5 g/dL (ref 3.5–5.2)
Alkaline Phosphatase: 60 U/L (ref 39–117)
Glucose, Bld: 153 mg/dL — ABNORMAL HIGH (ref 70–99)
Potassium: 4.3 mEq/L (ref 3.5–5.3)
Sodium: 139 mEq/L (ref 135–145)
Total Protein: 7.3 g/dL (ref 6.0–8.3)

## 2010-11-08 LAB — CBC WITH DIFFERENTIAL/PLATELET
BASO%: 0.5 % (ref 0.0–2.0)
Eosinophils Absolute: 1 10*3/uL — ABNORMAL HIGH (ref 0.0–0.5)
MONO#: 1 10*3/uL — ABNORMAL HIGH (ref 0.1–0.9)
MONO%: 7.6 % (ref 0.0–14.0)
NEUT#: 7.2 10*3/uL — ABNORMAL HIGH (ref 1.5–6.5)
RBC: 4.75 10*6/uL (ref 4.20–5.82)
RDW: 13.6 % (ref 11.0–14.6)
WBC: 13.5 10*3/uL — ABNORMAL HIGH (ref 4.0–10.3)

## 2010-11-09 ENCOUNTER — Ambulatory Visit: Payer: Medicaid Other | Attending: Radiation Oncology | Admitting: Radiation Oncology

## 2010-11-09 DIAGNOSIS — R1310 Dysphagia, unspecified: Secondary | ICD-10-CM | POA: Insufficient documentation

## 2010-11-09 DIAGNOSIS — Y842 Radiological procedure and radiotherapy as the cause of abnormal reaction of the patient, or of later complication, without mention of misadventure at the time of the procedure: Secondary | ICD-10-CM | POA: Insufficient documentation

## 2010-11-09 DIAGNOSIS — I10 Essential (primary) hypertension: Secondary | ICD-10-CM | POA: Insufficient documentation

## 2010-11-09 DIAGNOSIS — C3430 Malignant neoplasm of lower lobe, unspecified bronchus or lung: Secondary | ICD-10-CM | POA: Insufficient documentation

## 2010-11-09 DIAGNOSIS — R110 Nausea: Secondary | ICD-10-CM | POA: Insufficient documentation

## 2010-11-09 DIAGNOSIS — E860 Dehydration: Secondary | ICD-10-CM | POA: Insufficient documentation

## 2010-11-09 DIAGNOSIS — Z79899 Other long term (current) drug therapy: Secondary | ICD-10-CM | POA: Insufficient documentation

## 2010-11-09 DIAGNOSIS — C771 Secondary and unspecified malignant neoplasm of intrathoracic lymph nodes: Secondary | ICD-10-CM | POA: Insufficient documentation

## 2010-11-09 DIAGNOSIS — Z510 Encounter for antineoplastic radiation therapy: Secondary | ICD-10-CM | POA: Insufficient documentation

## 2010-11-09 DIAGNOSIS — R042 Hemoptysis: Secondary | ICD-10-CM | POA: Insufficient documentation

## 2010-11-09 DIAGNOSIS — F17200 Nicotine dependence, unspecified, uncomplicated: Secondary | ICD-10-CM | POA: Insufficient documentation

## 2010-11-09 DIAGNOSIS — E119 Type 2 diabetes mellitus without complications: Secondary | ICD-10-CM | POA: Insufficient documentation

## 2010-11-10 NOTE — Consult Note (Signed)
NAMEKOY, LAMP NO.:  [id number]  MEDICAL RECORD NO.:  [id number]          PATIENT TYPE:  LOCATION:                                 FACILITY:  PHYSICIAN:  Sheliah Plane, MD    DATE OF BIRTH:  11/26/1960  DATE OF CONSULTATION: DATE OF DISCHARGE:                                CONSULTATION   REQUESTING PHYSICIAN:  Jethro Bolus, MD  REFERRING PHYSICIAN:  Clinton D. Young, MD, FCCP, FACP  REASON FOR CONSULTATION:  Squamous cell carcinoma of the right lung.  HISTORY OF PRESENT ILLNESS:  The patient is a 50 year old [place] with a history of many years of smoking who has had intermittent hemoptysis for the past 5-6 months, because of the recurrent in spite of chest x-rays done in August and January that did not reveal a mass.  A CT scan in October 25, 2010, showed a 3.7 x 3.9 x 4.3 right lower lobe mass elevating the bronchus approximately 2.5 cm from the tracheobronchial bifurcation with probable direct extension into the mediastinal pleura.  In addition, there is a 2.3 cm subcarinal adenopathy extending to the left main stem bronchus and a right hilar adenopathy.  Bronchoscopy was performed.  Biopsies were obtained and showed occlusive tumor in the bronchus intermedius 2.5 cm distal to the main carina.  The patient notes that the bleeding has improved and overall he feels better.  Discussion of whether he would be a candidate for pneumonectomy led to thoracic surgery consultation.  PAST MEDICAL HISTORY:  Significant for hypertension and type 2 diabetes and long smoking history.  The patient has no known history of coronary artery disease, though he does have significant coronary calcifications on CT scan.  No previous history.  CURRENT MEDICATIONS: 1. Azithromycin 500 daily. 2. Cefotaxime 1 g daily. 3. Fentanyl patch. 4. Insulin. 5. Protonix. 6. Oxycodone.  ALLERGIES:  HE HAS NO KNOWN ALLERGIES.  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  Has a history of smoking one and a half packs a day since age 69.  FAMILY HISTORY:  The patient's parents died of unknown cause.  REVIEW OF SYSTEMS:  Reviewed in the patient's chart.  He denies amaurosis, TIAs, previous stroke.  Has had significant right facial pain, he describes is along the right neck extending up into his right ear.  Other review of systems negative.  PHYSICAL EXAMINATION:  The patient is afebrile.  Blood pressure 150/80, O2 sats 96% on room air.  Patient is awake, alert, neurologically intact and without any obvious facial swelling.  No evidence of superior vena caval obstruction.  He has no palpable cervical, supraclavicular adenopathy.  His lungs are clear bilaterally.  He has no murmur. Abdominal exam is benign.  Cranial nerves are grossly intact.  He has mild clubbing of all of his digits.  LABORATORY FINDINGS:  Reveal hemoglobin of 14.5, platelet count 239,000.  CT of the chest and PET of chest are all reviewed.  ASSESSMENT:  The patient is a 50 year old male with  squamous cell carcinoma of the lung with T3 N2 non-small-cell carcinoma of the lungs squamous cell subtype, appears that the patient has direct extension of tumor into the mediastinal pleura. The PET scan today shows a large subcarinal lymph node extending to the left main stem bronchus with probable imaging and bronchoscopy.  Staging indicates at least a 3a carcinoma.  Reviewing the films, seen the patient discussing the findings at [facility]. I believe the patient initially is not a candidate for pneumonectomy with an impending obstruction of his bronchus intermedius and the largest positive subcarinal lymph node proceeding with radiation therapy and consultation and chemotherapy would be more appropriate approach.  Discussed this with the patient and one of his daughters, who is  present.     Sheliah Plane, MD     EG/MEDQ  D:  11/03/2010  T:  11/04/2010  Job:  [id number]  cc:   Jethro Bolus, M.D. Clinton D. Maple Hudson, MD, [facility], FACP  Electronically Signed by Sheliah Plane MD on 11/10/2010 02:08:02 PM

## 2010-11-16 ENCOUNTER — Other Ambulatory Visit: Payer: Self-pay | Admitting: Oncology

## 2010-11-16 ENCOUNTER — Encounter (HOSPITAL_BASED_OUTPATIENT_CLINIC_OR_DEPARTMENT_OTHER): Payer: Medicaid Other | Admitting: Oncology

## 2010-11-16 DIAGNOSIS — C3400 Malignant neoplasm of unspecified main bronchus: Secondary | ICD-10-CM

## 2010-11-16 DIAGNOSIS — Z5111 Encounter for antineoplastic chemotherapy: Secondary | ICD-10-CM

## 2010-11-16 LAB — CBC WITH DIFFERENTIAL/PLATELET
BASO%: 0.5 % (ref 0.0–2.0)
EOS%: 3.1 % (ref 0.0–7.0)
LYMPH%: 16.8 % (ref 14.0–49.0)
MCH: 29.3 pg (ref 27.2–33.4)
MCHC: 34.8 g/dL (ref 32.0–36.0)
MCV: 84.3 fL (ref 79.3–98.0)
MONO#: 0.9 10*3/uL (ref 0.1–0.9)
MONO%: 6.6 % (ref 0.0–14.0)
NEUT%: 73 % (ref 39.0–75.0)
Platelets: 231 10*3/uL (ref 140–400)
RBC: 4.98 10*6/uL (ref 4.20–5.82)
WBC: 13.4 10*3/uL — ABNORMAL HIGH (ref 4.0–10.3)
nRBC: 0 % (ref 0–0)

## 2010-11-16 LAB — COMPREHENSIVE METABOLIC PANEL
ALT: 17 U/L (ref 0–53)
AST: 16 U/L (ref 0–37)
Alkaline Phosphatase: 56 U/L (ref 39–117)
BUN: 15 mg/dL (ref 6–23)
Creatinine, Ser: 0.85 mg/dL (ref 0.40–1.50)
Total Bilirubin: 0.4 mg/dL (ref 0.3–1.2)

## 2010-11-22 ENCOUNTER — Encounter (HOSPITAL_BASED_OUTPATIENT_CLINIC_OR_DEPARTMENT_OTHER): Payer: Medicaid Other | Admitting: Oncology

## 2010-11-22 ENCOUNTER — Other Ambulatory Visit: Payer: Self-pay | Admitting: Oncology

## 2010-11-22 DIAGNOSIS — C3400 Malignant neoplasm of unspecified main bronchus: Secondary | ICD-10-CM

## 2010-11-22 LAB — CBC WITH DIFFERENTIAL/PLATELET
Basophils Absolute: 0.1 10*3/uL (ref 0.0–0.1)
EOS%: 4.6 % (ref 0.0–7.0)
Eosinophils Absolute: 0.5 10*3/uL (ref 0.0–0.5)
HCT: 38 % — ABNORMAL LOW (ref 38.4–49.9)
HGB: 12.9 g/dL — ABNORMAL LOW (ref 13.0–17.1)
MCH: 28.6 pg (ref 27.2–33.4)
MCV: 84.3 fL (ref 79.3–98.0)
MONO%: 6.5 % (ref 0.0–14.0)
NEUT#: 9 10*3/uL — ABNORMAL HIGH (ref 1.5–6.5)
NEUT%: 77.2 % — ABNORMAL HIGH (ref 39.0–75.0)

## 2010-11-22 LAB — COMPREHENSIVE METABOLIC PANEL
AST: 12 U/L (ref 0–37)
Albumin: 4.3 g/dL (ref 3.5–5.2)
Alkaline Phosphatase: 56 U/L (ref 39–117)
BUN: 12 mg/dL (ref 6–23)
Calcium: 9.1 mg/dL (ref 8.4–10.5)
Chloride: 101 mEq/L (ref 96–112)
Creatinine, Ser: 0.85 mg/dL (ref 0.40–1.50)
Glucose, Bld: 211 mg/dL — ABNORMAL HIGH (ref 70–99)
Potassium: 4.1 mEq/L (ref 3.5–5.3)

## 2010-11-23 ENCOUNTER — Encounter (HOSPITAL_BASED_OUTPATIENT_CLINIC_OR_DEPARTMENT_OTHER): Payer: Medicaid Other | Admitting: Oncology

## 2010-11-23 DIAGNOSIS — C3400 Malignant neoplasm of unspecified main bronchus: Secondary | ICD-10-CM

## 2010-11-23 DIAGNOSIS — Z5111 Encounter for antineoplastic chemotherapy: Secondary | ICD-10-CM

## 2010-11-25 ENCOUNTER — Encounter (HOSPITAL_BASED_OUTPATIENT_CLINIC_OR_DEPARTMENT_OTHER): Payer: Medicaid Other | Admitting: Oncology

## 2010-11-30 ENCOUNTER — Other Ambulatory Visit: Payer: Self-pay | Admitting: Oncology

## 2010-11-30 ENCOUNTER — Encounter (HOSPITAL_BASED_OUTPATIENT_CLINIC_OR_DEPARTMENT_OTHER): Payer: Medicaid Other | Admitting: Oncology

## 2010-11-30 DIAGNOSIS — Z5111 Encounter for antineoplastic chemotherapy: Secondary | ICD-10-CM

## 2010-11-30 DIAGNOSIS — T451X5A Adverse effect of antineoplastic and immunosuppressive drugs, initial encounter: Secondary | ICD-10-CM

## 2010-11-30 DIAGNOSIS — C3400 Malignant neoplasm of unspecified main bronchus: Secondary | ICD-10-CM

## 2010-11-30 LAB — COMPREHENSIVE METABOLIC PANEL
ALT: 11 U/L (ref 0–53)
Albumin: 4.3 g/dL (ref 3.5–5.2)
CO2: 25 mEq/L (ref 19–32)
Calcium: 9.3 mg/dL (ref 8.4–10.5)
Chloride: 96 mEq/L (ref 96–112)
Glucose, Bld: 224 mg/dL — ABNORMAL HIGH (ref 70–99)
Potassium: 4.2 mEq/L (ref 3.5–5.3)
Sodium: 134 mEq/L — ABNORMAL LOW (ref 135–145)
Total Bilirubin: 0.3 mg/dL (ref 0.3–1.2)
Total Protein: 7 g/dL (ref 6.0–8.3)

## 2010-11-30 LAB — CBC WITH DIFFERENTIAL/PLATELET
Basophils Absolute: 0.1 10*3/uL (ref 0.0–0.1)
Eosinophils Absolute: 0.3 10*3/uL (ref 0.0–0.5)
HGB: 12.4 g/dL — ABNORMAL LOW (ref 13.0–17.1)
MCV: 85 fL (ref 79.3–98.0)
MONO#: 0.7 10*3/uL (ref 0.1–0.9)
NEUT#: 5.2 10*3/uL (ref 1.5–6.5)
RBC: 4.32 10*6/uL (ref 4.20–5.82)
RDW: 12.8 % (ref 11.0–14.6)
WBC: 7.4 10*3/uL (ref 4.0–10.3)
lymph#: 1.1 10*3/uL (ref 0.9–3.3)
nRBC: 0 % (ref 0–0)

## 2010-11-30 LAB — MAGNESIUM: Magnesium: 2 mg/dL (ref 1.5–2.5)

## 2010-12-07 ENCOUNTER — Other Ambulatory Visit: Payer: Self-pay | Admitting: Oncology

## 2010-12-07 ENCOUNTER — Encounter (HOSPITAL_BASED_OUTPATIENT_CLINIC_OR_DEPARTMENT_OTHER): Payer: Medicaid Other | Admitting: Oncology

## 2010-12-07 DIAGNOSIS — Z5111 Encounter for antineoplastic chemotherapy: Secondary | ICD-10-CM

## 2010-12-07 DIAGNOSIS — E440 Moderate protein-calorie malnutrition: Secondary | ICD-10-CM

## 2010-12-07 DIAGNOSIS — C3400 Malignant neoplasm of unspecified main bronchus: Secondary | ICD-10-CM

## 2010-12-07 DIAGNOSIS — D6959 Other secondary thrombocytopenia: Secondary | ICD-10-CM

## 2010-12-07 LAB — CBC WITH DIFFERENTIAL/PLATELET
BASO%: 1 % (ref 0.0–2.0)
EOS%: 3.7 % (ref 0.0–7.0)
HCT: 39.4 % (ref 38.4–49.9)
MCH: 29.1 pg (ref 27.2–33.4)
MCHC: 33.8 g/dL (ref 32.0–36.0)
NEUT%: 75.7 % — ABNORMAL HIGH (ref 39.0–75.0)
RBC: 4.57 10*6/uL (ref 4.20–5.82)
WBC: 6.8 10*3/uL (ref 4.0–10.3)
lymph#: 0.8 10*3/uL — ABNORMAL LOW (ref 0.9–3.3)
nRBC: 0 % (ref 0–0)

## 2010-12-07 LAB — COMPREHENSIVE METABOLIC PANEL
AST: 22 U/L (ref 0–37)
Alkaline Phosphatase: 54 U/L (ref 39–117)
BUN: 12 mg/dL (ref 6–23)
Glucose, Bld: 175 mg/dL — ABNORMAL HIGH (ref 70–99)
Sodium: 138 mEq/L (ref 135–145)
Total Bilirubin: 0.5 mg/dL (ref 0.3–1.2)
Total Protein: 7.6 g/dL (ref 6.0–8.3)

## 2010-12-12 ENCOUNTER — Other Ambulatory Visit: Payer: Self-pay | Admitting: Radiation Oncology

## 2010-12-12 LAB — CBC WITH DIFFERENTIAL/PLATELET
Basophils Absolute: 0 10*3/uL (ref 0.0–0.1)
Eosinophils Absolute: 0.1 10*3/uL (ref 0.0–0.5)
HGB: 14 g/dL (ref 13.0–17.1)
LYMPH%: 10 % — ABNORMAL LOW (ref 14.0–49.0)
MCV: 88.3 fL (ref 79.3–98.0)
MONO%: 8.1 % (ref 0.0–14.0)
NEUT#: 4.5 10*3/uL (ref 1.5–6.5)
NEUT%: 79.7 % — ABNORMAL HIGH (ref 39.0–75.0)
Platelets: 200 10*3/uL (ref 140–400)

## 2010-12-12 LAB — BASIC METABOLIC PANEL
BUN: 13 mg/dL (ref 6–23)
Calcium: 9.6 mg/dL (ref 8.4–10.5)
Creatinine, Ser: 0.73 mg/dL (ref 0.40–1.50)
Glucose, Bld: 253 mg/dL — ABNORMAL HIGH (ref 70–99)

## 2010-12-12 LAB — AFB CULTURE WITH SMEAR (NOT AT ARMC)

## 2010-12-14 ENCOUNTER — Other Ambulatory Visit: Payer: Self-pay | Admitting: Oncology

## 2010-12-14 ENCOUNTER — Encounter (HOSPITAL_BASED_OUTPATIENT_CLINIC_OR_DEPARTMENT_OTHER): Payer: Medicaid Other | Admitting: Oncology

## 2010-12-14 DIAGNOSIS — R630 Anorexia: Secondary | ICD-10-CM

## 2010-12-14 DIAGNOSIS — Z5111 Encounter for antineoplastic chemotherapy: Secondary | ICD-10-CM

## 2010-12-14 DIAGNOSIS — C3410 Malignant neoplasm of upper lobe, unspecified bronchus or lung: Secondary | ICD-10-CM

## 2010-12-14 DIAGNOSIS — T451X5A Adverse effect of antineoplastic and immunosuppressive drugs, initial encounter: Secondary | ICD-10-CM

## 2010-12-14 DIAGNOSIS — C3400 Malignant neoplasm of unspecified main bronchus: Secondary | ICD-10-CM

## 2010-12-14 LAB — CBC WITH DIFFERENTIAL/PLATELET
BASO%: 0.5 % (ref 0.0–2.0)
LYMPH%: 9.4 % — ABNORMAL LOW (ref 14.0–49.0)
MCHC: 34.2 g/dL (ref 32.0–36.0)
MONO#: 0.5 10*3/uL (ref 0.1–0.9)
Platelets: 146 10*3/uL (ref 140–400)
RBC: 4.42 10*6/uL (ref 4.20–5.82)
WBC: 8.4 10*3/uL (ref 4.0–10.3)
lymph#: 0.8 10*3/uL — ABNORMAL LOW (ref 0.9–3.3)
nRBC: 0 % (ref 0–0)

## 2010-12-14 LAB — COMPREHENSIVE METABOLIC PANEL
ALT: 24 U/L (ref 0–53)
AST: 20 U/L (ref 0–37)
Calcium: 9.5 mg/dL (ref 8.4–10.5)
Chloride: 99 mEq/L (ref 96–112)
Creatinine, Ser: 0.81 mg/dL (ref 0.40–1.50)

## 2010-12-21 ENCOUNTER — Other Ambulatory Visit: Payer: Self-pay | Admitting: Oncology

## 2010-12-21 ENCOUNTER — Encounter (HOSPITAL_BASED_OUTPATIENT_CLINIC_OR_DEPARTMENT_OTHER): Payer: Medicaid Other | Admitting: Oncology

## 2010-12-21 DIAGNOSIS — D6959 Other secondary thrombocytopenia: Secondary | ICD-10-CM

## 2010-12-21 DIAGNOSIS — Z5111 Encounter for antineoplastic chemotherapy: Secondary | ICD-10-CM

## 2010-12-21 DIAGNOSIS — C3400 Malignant neoplasm of unspecified main bronchus: Secondary | ICD-10-CM

## 2010-12-21 DIAGNOSIS — R000 Tachycardia, unspecified: Secondary | ICD-10-CM

## 2010-12-21 LAB — COMPREHENSIVE METABOLIC PANEL
Alkaline Phosphatase: 55 U/L (ref 39–117)
BUN: 12 mg/dL (ref 6–23)
CO2: 22 mEq/L (ref 19–32)
Creatinine, Ser: 0.76 mg/dL (ref 0.40–1.50)
Glucose, Bld: 268 mg/dL — ABNORMAL HIGH (ref 70–99)
Sodium: 134 mEq/L — ABNORMAL LOW (ref 135–145)
Total Bilirubin: 0.3 mg/dL (ref 0.3–1.2)
Total Protein: 7.3 g/dL (ref 6.0–8.3)

## 2010-12-21 LAB — CBC WITH DIFFERENTIAL/PLATELET
Basophils Absolute: 0 10*3/uL (ref 0.0–0.1)
EOS%: 2.6 % (ref 0.0–7.0)
Eosinophils Absolute: 0.1 10*3/uL (ref 0.0–0.5)
HGB: 13.1 g/dL (ref 13.0–17.1)
LYMPH%: 14.9 % (ref 14.0–49.0)
MCH: 29.6 pg (ref 27.2–33.4)
MCV: 85.8 fL (ref 79.3–98.0)
MONO%: 6.5 % (ref 0.0–14.0)
NEUT#: 3.8 10*3/uL (ref 1.5–6.5)
Platelets: 118 10*3/uL — ABNORMAL LOW (ref 140–400)
RDW: 13.7 % (ref 11.0–14.6)

## 2010-12-21 LAB — MAGNESIUM: Magnesium: 1.7 mg/dL (ref 1.5–2.5)

## 2010-12-28 ENCOUNTER — Other Ambulatory Visit: Payer: Self-pay | Admitting: Oncology

## 2010-12-28 ENCOUNTER — Encounter (HOSPITAL_BASED_OUTPATIENT_CLINIC_OR_DEPARTMENT_OTHER): Payer: Medicaid Other | Admitting: Oncology

## 2010-12-28 DIAGNOSIS — Z5111 Encounter for antineoplastic chemotherapy: Secondary | ICD-10-CM

## 2010-12-28 DIAGNOSIS — C3400 Malignant neoplasm of unspecified main bronchus: Secondary | ICD-10-CM

## 2010-12-28 LAB — COMPREHENSIVE METABOLIC PANEL
ALT: 20 U/L (ref 0–53)
CO2: 22 mEq/L (ref 19–32)
Calcium: 9.1 mg/dL (ref 8.4–10.5)
Chloride: 98 mEq/L (ref 96–112)
Creatinine, Ser: 0.77 mg/dL (ref 0.40–1.50)
Glucose, Bld: 203 mg/dL — ABNORMAL HIGH (ref 70–99)
Total Bilirubin: 0.3 mg/dL (ref 0.3–1.2)
Total Protein: 7.3 g/dL (ref 6.0–8.3)

## 2010-12-28 LAB — CBC WITH DIFFERENTIAL/PLATELET
BASO%: 0.5 % (ref 0.0–2.0)
Eosinophils Absolute: 0.1 10*3/uL (ref 0.0–0.5)
HCT: 36.2 % — ABNORMAL LOW (ref 38.4–49.9)
HGB: 12.6 g/dL — ABNORMAL LOW (ref 13.0–17.1)
LYMPH%: 17.9 % (ref 14.0–49.0)
MCHC: 34.8 g/dL (ref 32.0–36.0)
MONO#: 0.3 10*3/uL (ref 0.1–0.9)
NEUT#: 3.1 10*3/uL (ref 1.5–6.5)
NEUT%: 72.7 % (ref 39.0–75.0)
Platelets: 112 10*3/uL — ABNORMAL LOW (ref 140–400)
WBC: 4.2 10*3/uL (ref 4.0–10.3)
lymph#: 0.8 10*3/uL — ABNORMAL LOW (ref 0.9–3.3)

## 2010-12-28 LAB — MAGNESIUM: Magnesium: 1.9 mg/dL (ref 1.5–2.5)

## 2011-01-12 ENCOUNTER — Other Ambulatory Visit: Payer: Self-pay | Admitting: Oncology

## 2011-01-12 DIAGNOSIS — C3490 Malignant neoplasm of unspecified part of unspecified bronchus or lung: Secondary | ICD-10-CM

## 2011-01-19 ENCOUNTER — Ambulatory Visit (HOSPITAL_COMMUNITY)
Admission: RE | Admit: 2011-01-19 | Discharge: 2011-01-19 | Disposition: A | Discharge status: Home / Self Care | Payer: Medicaid Other | Source: Ambulatory Visit | Attending: Oncology | Admitting: Oncology

## 2011-01-19 ENCOUNTER — Other Ambulatory Visit: Payer: Self-pay | Admitting: Oncology

## 2011-01-19 DIAGNOSIS — Z5111 Encounter for antineoplastic chemotherapy: Secondary | ICD-10-CM

## 2011-01-19 DIAGNOSIS — R05 Cough: Secondary | ICD-10-CM

## 2011-01-19 DIAGNOSIS — J984 Other disorders of lung: Secondary | ICD-10-CM | POA: Insufficient documentation

## 2011-01-19 DIAGNOSIS — C3400 Malignant neoplasm of unspecified main bronchus: Secondary | ICD-10-CM

## 2011-01-19 DIAGNOSIS — M542 Cervicalgia: Secondary | ICD-10-CM

## 2011-01-19 DIAGNOSIS — R059 Cough, unspecified: Secondary | ICD-10-CM | POA: Insufficient documentation

## 2011-01-19 DIAGNOSIS — R6883 Chills (without fever): Secondary | ICD-10-CM | POA: Insufficient documentation

## 2011-01-19 DIAGNOSIS — C3410 Malignant neoplasm of upper lobe, unspecified bronchus or lung: Secondary | ICD-10-CM

## 2011-01-19 LAB — CBC WITH DIFFERENTIAL/PLATELET
Basophils Absolute: 0 10*3/uL (ref 0.0–0.1)
EOS%: 2.8 % (ref 0.0–7.0)
Eosinophils Absolute: 0.2 10*3/uL (ref 0.0–0.5)
HCT: 34.1 % — ABNORMAL LOW (ref 38.4–49.9)
HGB: 11.5 g/dL — ABNORMAL LOW (ref 13.0–17.1)
MCH: 30.5 pg (ref 27.2–33.4)
NEUT#: 4.7 10*3/uL (ref 1.5–6.5)
NEUT%: 63.9 % (ref 39.0–75.0)
RDW: 16.6 % — ABNORMAL HIGH (ref 11.0–14.6)
lymph#: 1.4 10*3/uL (ref 0.9–3.3)

## 2011-01-19 LAB — COMPREHENSIVE METABOLIC PANEL
AST: 22 U/L (ref 0–37)
Albumin: 3.8 g/dL (ref 3.5–5.2)
BUN: 13 mg/dL (ref 6–23)
CO2: 28 mEq/L (ref 19–32)
Calcium: 9.5 mg/dL (ref 8.4–10.5)
Chloride: 96 mEq/L (ref 96–112)
Creatinine, Ser: 0.64 mg/dL (ref 0.40–1.50)
Glucose, Bld: 279 mg/dL — ABNORMAL HIGH (ref 70–99)
Potassium: 4.8 mEq/L (ref 3.5–5.3)

## 2011-01-24 ENCOUNTER — Emergency Department (HOSPITAL_COMMUNITY)
Admission: EM | Admit: 2011-01-24 | Discharge: 2011-01-24 | Disposition: A | Discharge status: Home / Self Care | Payer: Medicaid Other | Attending: Emergency Medicine | Admitting: Emergency Medicine

## 2011-01-24 ENCOUNTER — Emergency Department (HOSPITAL_COMMUNITY): Payer: Medicaid Other

## 2011-01-24 DIAGNOSIS — E119 Type 2 diabetes mellitus without complications: Secondary | ICD-10-CM | POA: Insufficient documentation

## 2011-01-24 DIAGNOSIS — Z923 Personal history of irradiation: Secondary | ICD-10-CM | POA: Insufficient documentation

## 2011-01-24 DIAGNOSIS — C3490 Malignant neoplasm of unspecified part of unspecified bronchus or lung: Secondary | ICD-10-CM | POA: Insufficient documentation

## 2011-01-24 DIAGNOSIS — R05 Cough: Secondary | ICD-10-CM | POA: Insufficient documentation

## 2011-01-24 DIAGNOSIS — R059 Cough, unspecified: Secondary | ICD-10-CM | POA: Insufficient documentation

## 2011-01-24 DIAGNOSIS — R5381 Other malaise: Secondary | ICD-10-CM | POA: Insufficient documentation

## 2011-01-24 DIAGNOSIS — E7800 Pure hypercholesterolemia, unspecified: Secondary | ICD-10-CM | POA: Insufficient documentation

## 2011-01-24 DIAGNOSIS — Z9221 Personal history of antineoplastic chemotherapy: Secondary | ICD-10-CM | POA: Insufficient documentation

## 2011-01-24 DIAGNOSIS — I10 Essential (primary) hypertension: Secondary | ICD-10-CM | POA: Insufficient documentation

## 2011-01-24 LAB — URINALYSIS, ROUTINE W REFLEX MICROSCOPIC
Bilirubin Urine: NEGATIVE
Glucose, UA: >1000 mg/dL — AB
Hgb urine dipstick: NEGATIVE
Ketones, ur: NEGATIVE mg/dL
Leukocytes, UA: NEGATIVE
Nitrite: NEGATIVE
Protein, ur: NEGATIVE mg/dL
Specific Gravity, Urine: 1.022 (ref 1.005–1.030)
Urobilinogen, UA: 0.2 mg/dL (ref 0.0–1.0)
pH: 7 (ref 5.0–8.0)

## 2011-01-24 LAB — DIFFERENTIAL
Basophils Absolute: 0.1 10*3/uL (ref 0.0–0.1)
Basophils Relative: 1 % (ref 0–1)
Eosinophils Absolute: 0.4 10*3/uL (ref 0.0–0.7)
Eosinophils Relative: 5 % (ref 0–5)
Lymphocytes Relative: 23 % (ref 12–46)
Lymphs Abs: 1.6 K/uL (ref 0.7–4.0)
Monocytes Absolute: 0.8 10*3/uL (ref 0.1–1.0)
Monocytes Relative: 11 % (ref 3–12)
Neutro Abs: 4.3 K/uL (ref 1.7–7.7)
Neutrophils Relative %: 61 % (ref 43–77)

## 2011-01-24 LAB — CBC
HCT: 35.1 % — ABNORMAL LOW (ref 39.0–52.0)
Hemoglobin: 11.7 g/dL — ABNORMAL LOW (ref 13.0–17.0)
MCH: 29.5 pg (ref 26.0–34.0)
MCHC: 33.3 g/dL (ref 30.0–36.0)
MCV: 88.6 fL (ref 78.0–100.0)
Platelets: 207 10*3/uL (ref 150–400)
RBC: 3.96 MIL/uL — ABNORMAL LOW (ref 4.22–5.81)
RDW: 15.1 % (ref 11.5–15.5)
WBC: 7 10*3/uL (ref 4.0–10.5)

## 2011-01-24 LAB — BASIC METABOLIC PANEL
Calcium: 8.9 mg/dL (ref 8.4–10.5)
GFR calc non Af Amer: >60 mL/min (ref >60)
Glucose, Bld: 316 mg/dL — ABNORMAL HIGH (ref 70–99)
Potassium: 4.2 mEq/L (ref 3.5–5.1)
Sodium: 136 mEq/L (ref 135–145)

## 2011-01-24 LAB — BASIC METABOLIC PANEL WITH GFR
BUN: 15 mg/dL (ref 6–23)
CO2: 25 meq/L (ref 19–32)
Chloride: 99 meq/L (ref 96–112)
Creatinine, Ser: 0.71 mg/dL (ref 0.4–1.5)
GFR calc Af Amer: >60 mL/min (ref >60)

## 2011-01-24 LAB — URINE MICROSCOPIC-ADD ON

## 2011-01-25 ENCOUNTER — Emergency Department (HOSPITAL_COMMUNITY): Payer: Medicaid Other

## 2011-01-25 ENCOUNTER — Inpatient Hospital Stay (HOSPITAL_COMMUNITY)
Admission: EM | Admit: 2011-01-25 | Discharge: 2011-01-27 | DRG: 897 | Disposition: A | Discharge status: Home Health | Payer: Medicaid Other | Attending: Internal Medicine | Admitting: Internal Medicine

## 2011-01-25 DIAGNOSIS — IMO0001 Reserved for inherently not codable concepts without codable children: Secondary | ICD-10-CM | POA: Diagnosis present

## 2011-01-25 DIAGNOSIS — F19939 Other psychoactive substance use, unspecified with withdrawal, unspecified: Principal | ICD-10-CM | POA: Diagnosis present

## 2011-01-25 DIAGNOSIS — R509 Fever, unspecified: Secondary | ICD-10-CM | POA: Diagnosis present

## 2011-01-25 DIAGNOSIS — C3490 Malignant neoplasm of unspecified part of unspecified bronchus or lung: Secondary | ICD-10-CM | POA: Diagnosis present

## 2011-01-25 DIAGNOSIS — I10 Essential (primary) hypertension: Secondary | ICD-10-CM | POA: Diagnosis present

## 2011-01-25 DIAGNOSIS — Z8701 Personal history of pneumonia (recurrent): Secondary | ICD-10-CM

## 2011-01-25 DIAGNOSIS — F1120 Opioid dependence, uncomplicated: Secondary | ICD-10-CM | POA: Diagnosis present

## 2011-01-25 DIAGNOSIS — Z9221 Personal history of antineoplastic chemotherapy: Secondary | ICD-10-CM

## 2011-01-25 DIAGNOSIS — E860 Dehydration: Secondary | ICD-10-CM | POA: Diagnosis present

## 2011-01-25 DIAGNOSIS — IMO0002 Reserved for concepts with insufficient information to code with codable children: Secondary | ICD-10-CM | POA: Diagnosis present

## 2011-01-25 DIAGNOSIS — Z923 Personal history of irradiation: Secondary | ICD-10-CM

## 2011-01-25 LAB — COMPREHENSIVE METABOLIC PANEL
ALT: 21 U/L (ref 0–53)
BUN: 11 mg/dL (ref 6–23)
CO2: 24 mEq/L (ref 19–32)
Calcium: 9.9 mg/dL (ref 8.4–10.5)
GFR calc non Af Amer: >60 mL/min (ref >60)
Glucose, Bld: 311 mg/dL — ABNORMAL HIGH (ref 70–99)
Sodium: 132 mEq/L — ABNORMAL LOW (ref 135–145)

## 2011-01-25 LAB — CBC
MCH: 30.1 pg (ref 26.0–34.0)
MCHC: 34.2 g/dL (ref 30.0–36.0)
MCV: 88 fL (ref 78.0–100.0)
Platelets: 196 10*3/uL (ref 150–400)
RDW: 14.8 % (ref 11.5–15.5)

## 2011-01-25 LAB — CK TOTAL AND CKMB (NOT AT ARMC)
Relative Index: INVALID (ref 0.0–2.5)
Total CK: 87 U/L (ref 7–232)

## 2011-01-25 LAB — DIFFERENTIAL
Basophils Relative: 1 % (ref 0–1)
Eosinophils Absolute: 0.3 10*3/uL (ref 0.0–0.7)
Eosinophils Relative: 3 % (ref 0–5)
Lymphs Abs: 1.8 10*3/uL (ref 0.7–4.0)
Monocytes Relative: 9 % (ref 3–12)

## 2011-01-25 LAB — URINE MICROSCOPIC-ADD ON

## 2011-01-25 LAB — URINALYSIS, ROUTINE W REFLEX MICROSCOPIC
Bilirubin Urine: NEGATIVE
Glucose, UA: >1000 mg/dL — AB
Hgb urine dipstick: NEGATIVE
Ketones, ur: NEGATIVE mg/dL
Leukocytes, UA: NEGATIVE
pH: 7.5 (ref 5.0–8.0)

## 2011-01-25 LAB — PROTIME-INR: Prothrombin Time: 12.6 seconds (ref 11.6–15.2)

## 2011-01-25 LAB — LIPASE, BLOOD: Lipase: 28 U/L (ref 11–59)

## 2011-01-25 LAB — LACTIC ACID, PLASMA: Lactic Acid, Venous: 2.1 mmol/L (ref 0.5–2.2)

## 2011-01-25 MED ORDER — IOHEXOL 300 MG/ML  SOLN
100.0000 mL | Freq: Once | INTRAMUSCULAR | Status: AC | PRN
  Administered 2011-01-25: 100 mL via INTRAVENOUS

## 2011-01-26 LAB — GLUCOSE, CAPILLARY: Glucose-Capillary: 194 mg/dL — ABNORMAL HIGH (ref 70–99)

## 2011-01-26 LAB — URINE CULTURE
Colony Count: NO GROWTH
Culture  Setup Time: 201205301734

## 2011-01-26 LAB — BASIC METABOLIC PANEL
BUN: 6 mg/dL (ref 6–23)
CO2: 24 mEq/L (ref 19–32)
Chloride: 103 mEq/L (ref 96–112)
Creatinine, Ser: 0.69 mg/dL (ref 0.4–1.5)

## 2011-01-26 LAB — CBC
Hemoglobin: 11.5 g/dL — ABNORMAL LOW (ref 13.0–17.0)
MCH: 29.6 pg (ref 26.0–34.0)
MCV: 87.1 fL (ref 78.0–100.0)
Platelets: 201 10*3/uL (ref 150–400)
RBC: 3.88 MIL/uL — ABNORMAL LOW (ref 4.22–5.81)

## 2011-01-27 LAB — GLUCOSE, CAPILLARY: Glucose-Capillary: 190 mg/dL — ABNORMAL HIGH (ref 70–99)

## 2011-02-01 ENCOUNTER — Other Ambulatory Visit: Payer: Self-pay | Admitting: Oncology

## 2011-02-01 ENCOUNTER — Encounter (HOSPITAL_BASED_OUTPATIENT_CLINIC_OR_DEPARTMENT_OTHER): Payer: Medicaid Other | Admitting: Oncology

## 2011-02-01 DIAGNOSIS — E119 Type 2 diabetes mellitus without complications: Secondary | ICD-10-CM

## 2011-02-01 DIAGNOSIS — E440 Moderate protein-calorie malnutrition: Secondary | ICD-10-CM

## 2011-02-01 DIAGNOSIS — C3400 Malignant neoplasm of unspecified main bronchus: Secondary | ICD-10-CM

## 2011-02-01 DIAGNOSIS — K219 Gastro-esophageal reflux disease without esophagitis: Secondary | ICD-10-CM

## 2011-02-01 LAB — CULTURE, BLOOD (ROUTINE X 2)
Culture  Setup Time: 201205310124
Culture: NO GROWTH
Culture: NO GROWTH

## 2011-02-01 LAB — CBC WITH DIFFERENTIAL/PLATELET
Basophils Absolute: 0.1 10*3/uL (ref 0.0–0.1)
EOS%: 6.2 % (ref 0.0–7.0)
Eosinophils Absolute: 0.5 10*3/uL (ref 0.0–0.5)
HCT: 31.6 % — ABNORMAL LOW (ref 38.4–49.9)
HGB: 10.9 g/dL — ABNORMAL LOW (ref 13.0–17.1)
LYMPH%: 19.8 % (ref 14.0–49.0)
MCH: 31.3 pg (ref 27.2–33.4)
MCV: 91.3 fL (ref 79.3–98.0)
MONO%: 10.5 % (ref 0.0–14.0)
NEUT#: 4.6 10*3/uL (ref 1.5–6.5)
NEUT%: 62.8 % (ref 39.0–75.0)
Platelets: 189 10*3/uL (ref 140–400)
RDW: 17.3 % — ABNORMAL HIGH (ref 11.0–14.6)

## 2011-02-01 LAB — COMPREHENSIVE METABOLIC PANEL
AST: 17 U/L (ref 0–37)
Albumin: 3.8 g/dL (ref 3.5–5.2)
Alkaline Phosphatase: 51 U/L (ref 39–117)
BUN: 17 mg/dL (ref 6–23)
Creatinine, Ser: 0.7 mg/dL (ref 0.50–1.35)
Glucose, Bld: 276 mg/dL — ABNORMAL HIGH (ref 70–99)
Potassium: 3.7 mEq/L (ref 3.5–5.3)
Total Bilirubin: 0.2 mg/dL — ABNORMAL LOW (ref 0.3–1.2)

## 2011-02-06 ENCOUNTER — Ambulatory Visit
Admit: 2011-02-06 | Discharge: 2011-02-06 | Disposition: A | Discharge status: Home / Self Care | Payer: Medicaid Other | Attending: Radiation Oncology | Admitting: Radiation Oncology

## 2011-02-09 ENCOUNTER — Encounter (HOSPITAL_COMMUNITY): Payer: Self-pay

## 2011-02-09 ENCOUNTER — Ambulatory Visit (HOSPITAL_COMMUNITY)
Admission: RE | Admit: 2011-02-09 | Discharge: 2011-02-09 | Disposition: A | Discharge status: Home / Self Care | Payer: Medicaid Other | Source: Ambulatory Visit | Attending: Oncology | Admitting: Oncology

## 2011-02-09 ENCOUNTER — Other Ambulatory Visit (HOSPITAL_COMMUNITY): Payer: Medicaid Other

## 2011-02-09 DIAGNOSIS — R599 Enlarged lymph nodes, unspecified: Secondary | ICD-10-CM | POA: Insufficient documentation

## 2011-02-09 DIAGNOSIS — C3490 Malignant neoplasm of unspecified part of unspecified bronchus or lung: Secondary | ICD-10-CM | POA: Insufficient documentation

## 2011-02-09 DIAGNOSIS — K7689 Other specified diseases of liver: Secondary | ICD-10-CM | POA: Insufficient documentation

## 2011-02-09 HISTORY — DX: Essential (primary) hypertension: I10

## 2011-02-09 HISTORY — DX: Malignant neoplasm of unspecified part of unspecified bronchus or lung: C34.90

## 2011-02-09 MED ORDER — IOHEXOL 300 MG/ML  SOLN
80.0000 mL | Freq: Once | INTRAMUSCULAR | Status: AC | PRN
  Administered 2011-02-09: 80 mL via INTRAVENOUS

## 2011-02-24 ENCOUNTER — Other Ambulatory Visit: Payer: Self-pay | Admitting: Oncology

## 2011-02-24 ENCOUNTER — Encounter (HOSPITAL_BASED_OUTPATIENT_CLINIC_OR_DEPARTMENT_OTHER): Payer: Medicaid Other | Admitting: Oncology

## 2011-02-24 DIAGNOSIS — C3400 Malignant neoplasm of unspecified main bronchus: Secondary | ICD-10-CM

## 2011-02-24 DIAGNOSIS — Z5111 Encounter for antineoplastic chemotherapy: Secondary | ICD-10-CM

## 2011-02-24 DIAGNOSIS — M542 Cervicalgia: Secondary | ICD-10-CM

## 2011-02-24 DIAGNOSIS — C3410 Malignant neoplasm of upper lobe, unspecified bronchus or lung: Secondary | ICD-10-CM

## 2011-02-24 LAB — CBC WITH DIFFERENTIAL/PLATELET
Basophils Absolute: 0 10*3/uL (ref 0.0–0.1)
EOS%: 10.5 % — ABNORMAL HIGH (ref 0.0–7.0)
Eosinophils Absolute: 1 10*3/uL — ABNORMAL HIGH (ref 0.0–0.5)
HCT: 36 % — ABNORMAL LOW (ref 38.4–49.9)
HGB: 12.2 g/dL — ABNORMAL LOW (ref 13.0–17.1)
MCH: 30.2 pg (ref 27.2–33.4)
MCV: 89.1 fL (ref 79.3–98.0)
MONO%: 8.6 % (ref 0.0–14.0)
NEUT#: 5.8 10*3/uL (ref 1.5–6.5)
NEUT%: 64.1 % (ref 39.0–75.0)
RDW: 13.8 % (ref 11.0–14.6)
lymph#: 1.5 10*3/uL (ref 0.9–3.3)

## 2011-02-24 LAB — COMPREHENSIVE METABOLIC PANEL
Albumin: 4.4 g/dL (ref 3.5–5.2)
BUN: 18 mg/dL (ref 6–23)
Calcium: 9.5 mg/dL (ref 8.4–10.5)
Chloride: 104 mEq/L (ref 96–112)
Creatinine, Ser: 0.79 mg/dL (ref 0.50–1.35)
Glucose, Bld: 247 mg/dL — ABNORMAL HIGH (ref 70–99)
Potassium: 4.2 mEq/L (ref 3.5–5.3)

## 2011-02-25 ENCOUNTER — Encounter (HOSPITAL_BASED_OUTPATIENT_CLINIC_OR_DEPARTMENT_OTHER): Payer: Medicaid Other | Admitting: Oncology

## 2011-02-25 DIAGNOSIS — Z5111 Encounter for antineoplastic chemotherapy: Secondary | ICD-10-CM

## 2011-02-25 DIAGNOSIS — C3400 Malignant neoplasm of unspecified main bronchus: Secondary | ICD-10-CM

## 2011-03-02 NOTE — H&P (Signed)
George Griffin, George Griffin                     ACCOUNT NO.:  [id number]  MEDICAL RECORD NO.:  [id number]           PATIENT TYPE:  E  LOCATION:  WLED                         FACILITY:  [facility]  PHYSICIAN:  Clydia Llano, MD       DATE OF BIRTH:  05/02/1961  DATE OF ADMISSION:  01/25/2011 DATE OF DISCHARGE:                             HISTORY & PHYSICAL   PRIMARY ONCOLOGIST:  Jethro Bolus, MD  REASON FOR ADMISSION:  Nausea and vomiting.  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  The patient came into the hospital because of generalized weakness.  The patient's symptoms started yesterday with nausea and vomiting.  The patient vomited several times, did not mention any blood in that.  It was primarily foot particles.  The patient came into the hospital yesterday for evaluation, thought to be a little bit dehydrated, has generalized weakness and then was discharged home.  After getting discharged, the patient thought he was getting worse with having fevers, could not sleep last night and still having the nausea, vomiting.  The patient got very weak to the point he could not ambulate by himself, so the patient came into the hospital again for further medical evaluation.  Upon initial evaluation in the emergency department, the patient was found to have glucose of 311, but he was not acidotic.  The patient will be admitted for further medical evaluation.  PAST MEDICAL HISTORY: 1. Squamous cell lung cancer, status post radiation and chemotherapy,     status post 7 sessions. 2. History of postobstructive pneumonia, cleared up on today's x-ray. 3. Diabetes mellitus type 2. 4. Hypertension. 5. Neck pain. 6. Atherosclerosis. 7. Constipation.  ALLERGIES:  No known drug allergies.  MEDICATIONS: 1. Augmentin started on 5/24 1 tablet 875 mg p.o. b.i.d. 2.  Fentanyl patch 75 mcg per hour every 3 days. 3. Chemotherapy regimen, follow up with Dr. Gaylyn Rong. 4. Dalmane 30 mg daily at bedtime. 5. Januvia 100 mg p.o. daily. 6. Zofran 4 mg every 12 hours as needed for nausea. 7. Prochlorperazine 10 mg p.o. every 6 hours as needed for nausea.  FAMILY HISTORY:  The patient has no information about his extended family in [place].  SOCIAL HISTORY:  The patient is married.  He is a [place].  He used to smoke half pack per day since he was 85, he quit now. Denies alcohol.  PHYSICAL EXAMINATION:  VITAL SIGNS:  Temperature is 99.7, respirations 20, pulse is 114, blood pressure is 134/82. GENERAL:  The patient is a well-developed, irritable looking Asian male. HEENT:  Head and face:  Normocephalic, atraumatic.  Eyes:  Pupils equal, reactive to light and accommodation.  No scleral icterus.  ENT:  Mouth and pharynx normal.  Mucous membranes dry. NECK:  Supple.  Full range of motion.  No lymphadenopathy. CARDIOVASCULAR:  No murmurs, rubs or gallops. RESPIRATORY:  Breath sounds clear bilaterally.  Chest nontender. Movement normal. ABDOMEN:  Bowel sounds heard.  Soft, nontender,  nondistended. EXTREMITIES:  No deformity.  Full range of motion. NEUROLOGIC:  Alert, awake, oriented x3. SKIN:  Color normal, dry.  RADIOLOGIC DATA: 1. EKG showed normal sinus rhythm with nonspecific ST and T-wave     changes. 2. CT abdomen and pelvis showed no acute intra-abdominal process,     chronic changes noted which include diffuse hepatic steatosis. 3. Chest x-ray showed almost complete clearing of the probable     postobstructive infiltrates at the right lung base.  There is a     right hilar mass which is less apparent now.  LABORATORY DATA: 1. Lipase 28.  Cardiac enzymes CK 287, CK-MB is 1.8. 2. BMP:  Sodium 132, potassium 4.2, chloride 97, bicarb is 24, glucose     is 311, BUN is 11, creatinine 0.6. 3. LFTs:  AST 18, ALT 21, albumin 4.1 alkaline  phosphatase 61. 4. CBC:  WBC 7.9, hemoglobin 11.0, hematocrit is 32.2, platelet count     is 197. 5. Urinalysis showed negative nitrite, negative LE.  ASSESSMENT AND PLAN: 1. Nausea and vomiting with low-grade fever:  The patient might have     gastroenteritis, viral versus bacterial.  The patient will be     started on ciprofloxacin.  The CT scan of abdomen did not show any     evidence of colitis, so I will not start Flagyl.  I will obtain     blood culture to rule out other infections. 2. Dehydration:  Clinically apparent but no biochemical evidence of     dehydration.  Will hydrate with IV fluids. 3. Generalized weakness:  The patient needs a PT evaluation.  This is     probably because of the acute illness and recent chemotherapy. 4. Lung squamous cell carcinoma:  The patient received radiation for     the past 7 weeks and did receive chemotherapy as well.  Last     chemotherapy was about 2 weeks' ago. 5. Diabetes mellitus type 2:  The patient will be put on insulin     sliding scale as well as carbohydrate-modified diet.     Clydia Llano, MD    ME/MEDQ  D:  01/25/2011  T:  01/25/2011  Job:  [id number]  Electronically Signed by Clydia Llano  on 03/02/2011 01:19:45 PM

## 2011-03-02 NOTE — Discharge Summary (Signed)
George Griffin, George Griffin                     ACCOUNT NO.:  [id number]  MEDICAL RECORD NO.:  [id number]           PATIENT TYPE:  I  LOCATION:  1508                         FACILITY:  [facility]  PHYSICIAN:  Clydia Llano, MD       DATE OF BIRTH:  19-Aug-1961  DATE OF ADMISSION:  01/25/2011 DATE OF DISCHARGE:                              DISCHARGE SUMMARY   PRIMARY ONCOLOGIST:  Exie Parody, M.D.  REASON FOR ADMISSION:  Nausea and vomiting.  DISCHARGE DIAGNOSES: 1. Nausea and vomiting, resolved. 2. Likely opioid withdrawal. 3. Squamous cell carcinoma. 4. Diabetes mellitus type 2, uncontrolled. 5. Hypertension. 6. History of postobstructive pneumonia. 7. Atherosclerosis.  DISCHARGE MEDICATIONS: 1. Fentanyl patch 25 mcg 1 patch transdermally every 3 days,     prescription for 3 patches was given. 2. Chemotherapy regimen followed by Dr. Gaylyn Rong including Taxol and     carboplatin. 3. Dalmane daily at bedtime as needed for insomnia. 4. Januvia 100 mg p.o. daily. 5. Ondansetron 4 mg every 12 hours as needed for nausea. 6. Prochlorperazine 10 mg every 6 hours as needed for nausea.  BRIEF HISTORY AND EXAMINATION:  Mr. George Griffin is a 50 year old [place] American male with a past medical history of diabetes, hypertension and recently diagnosed squamous cell carcinoma.  The patient does not speak [place].  All interviews were done in the presence of interpreter.  The patient came into the hospital with very nonspecific symptoms, nausea, vomiting, low-grade fever, agitation and insomnia.  The patient also on time of evaluation in the emergency department was found to have glucose of 311 but was not acidotic.  The patient admitted for further evaluation. 1. Nausea and vomiting.  The patient likely has opioid withdrawal.     The patient was on fentanyl patch 75 mcg pearly per hour every 3     days.  The patient mentioned that he ran out of his medication     about 7 days prior to admission and then 3 days  later he started to     have nausea, and he vomited more than once.  The patient has     insomnia and agitation.  At the time of initial evaluation, the     patient was thought to have mild gastroenteritis with the low-grade     fever, nausea and vomiting, but the opioid withdrawal fits more in     his picture.  Although the fentanyl half-life is a few hours but     getting fentanyl through subcutaneous patch can make the fentanyl     conjugate to subcu fat and will circulate in the body for some     time, so the withdrawal will affect the patient later because the     patient will still have some fentanyl circulating in his system.     Anyway, the patient discharged on reduced dose of fentanyl patch at     25 mcg.  He is doing fine, eating well and sleeping well on the day     of discharge.  The patient will follow up with Dr. Gaylyn Rong in  less than     5 days to decide about the fentanyl patch, probably careful weaning     will be better. 2. Squamous cell cancer of the lung.  The patient is getting     chemotherapy and radiation with Dr. Gaylyn Rong.  That should be followed as     outpatient.  Recent x-rays showing shrinking of the right hilar     mass as well as clearing of postobstructive pneumonia. 3. Diabetes mellitus type 2.  Uncontrolled with hemoglobin A1c of 8.8.     The patient is taking Januvia 100 mg, that was continued at the     time of discharge.  The patient needs tighter glycemic control.     The patient counseled on his diet and about adherence to the     medications. 4. Dehydration clinically apparent at the of admission.  The patient     hydrated with IV fluids.  RADIOLOGY:  CT abdomen and pelvis showed no acute abdominal process. Chest x-ray on May 30 showed almost complete clearing of probable postobstructive infiltrate of the right lung base, right hilar mass is less apparent.  DISCHARGE INSTRUCTIONS: 1. Disposition home. 2. Activity as tolerated. 3. Diet,  carbohydrate-modified diet.     Clydia Llano, MD     ME/MEDQ  D:  01/27/2011  T:  01/27/2011  Job:  [id number]  cc:   Exie Parody, M.D.  Electronically Signed by Clydia Llano  on 03/02/2011 01:20:58 PM

## 2011-03-17 ENCOUNTER — Other Ambulatory Visit: Payer: Self-pay | Admitting: Oncology

## 2011-03-17 ENCOUNTER — Encounter (HOSPITAL_BASED_OUTPATIENT_CLINIC_OR_DEPARTMENT_OTHER): Payer: Medicaid Other | Admitting: Oncology

## 2011-03-17 DIAGNOSIS — E440 Moderate protein-calorie malnutrition: Secondary | ICD-10-CM

## 2011-03-17 DIAGNOSIS — C3490 Malignant neoplasm of unspecified part of unspecified bronchus or lung: Secondary | ICD-10-CM

## 2011-03-17 DIAGNOSIS — E119 Type 2 diabetes mellitus without complications: Secondary | ICD-10-CM

## 2011-03-17 DIAGNOSIS — Z5111 Encounter for antineoplastic chemotherapy: Secondary | ICD-10-CM

## 2011-03-17 DIAGNOSIS — C3400 Malignant neoplasm of unspecified main bronchus: Secondary | ICD-10-CM

## 2011-03-17 DIAGNOSIS — K219 Gastro-esophageal reflux disease without esophagitis: Secondary | ICD-10-CM

## 2011-03-17 LAB — CBC WITH DIFFERENTIAL/PLATELET
Basophils Absolute: 0.1 10*3/uL (ref 0.0–0.1)
Eosinophils Absolute: 0.3 10*3/uL (ref 0.0–0.5)
HCT: 34.6 % — ABNORMAL LOW (ref 38.4–49.9)
HGB: 11.5 g/dL — ABNORMAL LOW (ref 13.0–17.1)
MONO#: 0.8 10*3/uL (ref 0.1–0.9)
NEUT#: 6.9 10*3/uL — ABNORMAL HIGH (ref 1.5–6.5)
NEUT%: 71.7 % (ref 39.0–75.0)
RDW: 14.5 % (ref 11.0–14.6)
lymph#: 1.6 10*3/uL (ref 0.9–3.3)

## 2011-03-17 LAB — COMPREHENSIVE METABOLIC PANEL
Albumin: 4.4 g/dL (ref 3.5–5.2)
Alkaline Phosphatase: 57 U/L (ref 39–117)
CO2: 21 mEq/L (ref 19–32)
Calcium: 9.2 mg/dL (ref 8.4–10.5)
Chloride: 104 mEq/L (ref 96–112)
Glucose, Bld: 70 mg/dL (ref 70–99)
Potassium: 3.8 mEq/L (ref 3.5–5.3)
Sodium: 139 mEq/L (ref 135–145)
Total Protein: 7.6 g/dL (ref 6.0–8.3)

## 2011-03-18 ENCOUNTER — Encounter (HOSPITAL_BASED_OUTPATIENT_CLINIC_OR_DEPARTMENT_OTHER): Payer: Medicaid Other | Admitting: Oncology

## 2011-03-18 DIAGNOSIS — IMO0002 Reserved for concepts with insufficient information to code with codable children: Secondary | ICD-10-CM

## 2011-03-18 DIAGNOSIS — C3400 Malignant neoplasm of unspecified main bronchus: Secondary | ICD-10-CM

## 2011-05-18 ENCOUNTER — Encounter (HOSPITAL_BASED_OUTPATIENT_CLINIC_OR_DEPARTMENT_OTHER): Payer: Medicaid Other | Admitting: Oncology

## 2011-05-18 ENCOUNTER — Other Ambulatory Visit: Payer: Self-pay | Admitting: Oncology

## 2011-05-18 DIAGNOSIS — E119 Type 2 diabetes mellitus without complications: Secondary | ICD-10-CM

## 2011-05-18 DIAGNOSIS — K219 Gastro-esophageal reflux disease without esophagitis: Secondary | ICD-10-CM

## 2011-05-18 DIAGNOSIS — E440 Moderate protein-calorie malnutrition: Secondary | ICD-10-CM

## 2011-05-18 DIAGNOSIS — C3400 Malignant neoplasm of unspecified main bronchus: Secondary | ICD-10-CM

## 2011-05-18 DIAGNOSIS — G608 Other hereditary and idiopathic neuropathies: Secondary | ICD-10-CM

## 2011-05-18 LAB — COMPREHENSIVE METABOLIC PANEL
AST: 23 U/L (ref 0–37)
Albumin: 4.5 g/dL (ref 3.5–5.2)
Alkaline Phosphatase: 42 U/L (ref 39–117)
Glucose, Bld: 266 mg/dL — ABNORMAL HIGH (ref 70–99)
Potassium: 4.2 mEq/L (ref 3.5–5.3)
Sodium: 137 mEq/L (ref 135–145)
Total Protein: 7.8 g/dL (ref 6.0–8.3)

## 2011-05-18 LAB — CBC WITH DIFFERENTIAL/PLATELET
EOS%: 4.8 % (ref 0.0–7.0)
Eosinophils Absolute: 0.3 10*3/uL (ref 0.0–0.5)
MCV: 90.3 fL (ref 79.3–98.0)
MONO%: 9.9 % (ref 0.0–14.0)
NEUT#: 3.4 10*3/uL (ref 1.5–6.5)
RBC: 4.2 10*6/uL (ref 4.20–5.82)
RDW: 15.2 % — ABNORMAL HIGH (ref 11.0–14.6)
WBC: 5.8 10*3/uL (ref 4.0–10.3)
lymph#: 1.5 10*3/uL (ref 0.9–3.3)

## 2011-05-23 LAB — POCT I-STAT, CHEM 8
Chloride: 105
HCT: 49
Potassium: 4.1

## 2011-05-23 LAB — CBC
MCHC: 35.1
RDW: 13.2

## 2011-05-23 LAB — DIFFERENTIAL
Basophils Absolute: 0.1
Basophils Relative: 1
Neutro Abs: 5.2
Neutrophils Relative %: 56

## 2011-05-23 LAB — D-DIMER, QUANTITATIVE: D-Dimer, Quant: <0.22

## 2011-06-02 LAB — I-STAT 8, (EC8 V) (CONVERTED LAB)
Glucose, Bld: 159 — ABNORMAL HIGH
HCT: 51
Hemoglobin: 17.3 — ABNORMAL HIGH
Operator id: 257131
Potassium: 4.6
Sodium: 140
TCO2: 33

## 2011-06-02 LAB — POCT I-STAT CREATININE: Operator id: 257131

## 2011-07-23 ENCOUNTER — Encounter: Payer: Self-pay | Admitting: Oncology

## 2011-07-24 ENCOUNTER — Encounter: Payer: Self-pay | Admitting: *Deleted

## 2011-07-26 ENCOUNTER — Other Ambulatory Visit: Payer: Self-pay | Admitting: Oncology

## 2011-07-26 ENCOUNTER — Other Ambulatory Visit (HOSPITAL_BASED_OUTPATIENT_CLINIC_OR_DEPARTMENT_OTHER): Payer: Medicaid Other | Admitting: Lab

## 2011-07-26 ENCOUNTER — Ambulatory Visit (HOSPITAL_COMMUNITY)
Admission: RE | Admit: 2011-07-26 | Discharge: 2011-07-26 | Disposition: A | Discharge status: Home / Self Care | Payer: Medicaid Other | Source: Ambulatory Visit | Attending: Oncology | Admitting: Oncology

## 2011-07-26 DIAGNOSIS — C3400 Malignant neoplasm of unspecified main bronchus: Secondary | ICD-10-CM

## 2011-07-26 DIAGNOSIS — C3490 Malignant neoplasm of unspecified part of unspecified bronchus or lung: Secondary | ICD-10-CM | POA: Insufficient documentation

## 2011-07-26 DIAGNOSIS — K219 Gastro-esophageal reflux disease without esophagitis: Secondary | ICD-10-CM

## 2011-07-26 DIAGNOSIS — Y842 Radiological procedure and radiotherapy as the cause of abnormal reaction of the patient, or of later complication, without mention of misadventure at the time of the procedure: Secondary | ICD-10-CM | POA: Insufficient documentation

## 2011-07-26 DIAGNOSIS — K760 Fatty (change of) liver, not elsewhere classified: Secondary | ICD-10-CM

## 2011-07-26 DIAGNOSIS — J189 Pneumonia, unspecified organism: Secondary | ICD-10-CM | POA: Insufficient documentation

## 2011-07-26 DIAGNOSIS — N62 Hypertrophy of breast: Secondary | ICD-10-CM | POA: Insufficient documentation

## 2011-07-26 DIAGNOSIS — K7689 Other specified diseases of liver: Secondary | ICD-10-CM | POA: Insufficient documentation

## 2011-07-26 DIAGNOSIS — E440 Moderate protein-calorie malnutrition: Secondary | ICD-10-CM

## 2011-07-26 DIAGNOSIS — E119 Type 2 diabetes mellitus without complications: Secondary | ICD-10-CM

## 2011-07-26 HISTORY — DX: Fatty (change of) liver, not elsewhere classified: K76.0

## 2011-07-26 LAB — CMP (CANCER CENTER ONLY)
ALT(SGPT): 71 U/L — ABNORMAL HIGH (ref 10–47)
AST: 43 U/L — ABNORMAL HIGH (ref 11–38)
Albumin: 3.8 g/dL (ref 3.3–5.5)
Alkaline Phosphatase: 48 U/L (ref 26–84)
Calcium: 8.7 mg/dL (ref 8.0–10.3)
Chloride: 105 mEq/L (ref 98–108)
Potassium: 4.4 mEq/L (ref 3.3–4.7)
Sodium: 139 mEq/L (ref 128–145)
Total Protein: 8.1 g/dL (ref 6.4–8.1)

## 2011-07-26 LAB — CBC WITH DIFFERENTIAL/PLATELET
BASO%: 0.3 % (ref 0.0–2.0)
Basophils Absolute: 0 10*3/uL (ref 0.0–0.1)
EOS%: 5.3 % (ref 0.0–7.0)
HGB: 13.2 g/dL (ref 13.0–17.1)
MCH: 29.9 pg (ref 27.2–33.4)
MCHC: 33.6 g/dL (ref 32.0–36.0)
MCV: 89.1 fL (ref 79.3–98.0)
MONO%: 10.7 % (ref 0.0–14.0)
RBC: 4.4 10*6/uL (ref 4.20–5.82)
RDW: 14.3 % (ref 11.0–14.6)
lymph#: 1.9 10*3/uL (ref 0.9–3.3)

## 2011-07-26 MED ORDER — IOHEXOL 300 MG/ML  SOLN
80.0000 mL | Freq: Once | INTRAMUSCULAR | Status: AC | PRN
  Administered 2011-07-26: 80 mL via INTRAVENOUS

## 2011-07-27 ENCOUNTER — Ambulatory Visit (HOSPITAL_BASED_OUTPATIENT_CLINIC_OR_DEPARTMENT_OTHER): Payer: Medicaid Other | Admitting: Oncology

## 2011-07-27 VITALS — BP 125/80 | HR 93 | Temp 97.6°F | Ht 66.0 in | Wt 178.3 lb

## 2011-07-27 DIAGNOSIS — C3400 Malignant neoplasm of unspecified main bronchus: Secondary | ICD-10-CM

## 2011-07-27 DIAGNOSIS — M2550 Pain in unspecified joint: Secondary | ICD-10-CM

## 2011-07-27 DIAGNOSIS — R7402 Elevation of levels of lactic acid dehydrogenase (LDH): Secondary | ICD-10-CM

## 2011-07-27 DIAGNOSIS — Z85118 Personal history of other malignant neoplasm of bronchus and lung: Secondary | ICD-10-CM

## 2011-07-27 DIAGNOSIS — Z23 Encounter for immunization: Secondary | ICD-10-CM

## 2011-07-27 DIAGNOSIS — E119 Type 2 diabetes mellitus without complications: Secondary | ICD-10-CM

## 2011-07-27 MED ORDER — INFLUENZA VIRUS VACC SPLIT PF IM SUSP
0.5000 mL | INTRAMUSCULAR | Status: AC | PRN
  Administered 2011-07-27: 0.5 mL via INTRAMUSCULAR

## 2011-07-27 NOTE — Progress Notes (Signed)
George Griffin with every 3 week carboplatin/Taxol x2 cycles which concluded on 03/18/2011.  CURRENT THERAPY:  Watchful observation and surveillance.  INTERVAL HISTORY: Babak Samson Ralph 50 y.o. male returns for regular follow up.  He has good appetite and has bene having weight gain.  He has intermittent diffuse arthralgia in the bilateral shoulders, maxilla without spinal/vetebral tenderness, leg paresthesia, leg weakness, bowel/bladder incontinence.  He takes Tylenol 325-500mg  PO daily prn with good resolution of the pain.  He denies fatigue, headache, cough, SOB, hemoptysis, chest pain, abdominal pain, bleeding symptoms.    MEDICAL HISTORY: Past Medical History  Diagnosis Date  . Diabetes mellitus     metfomin  . Lung cancer     lung ca dx5/02/2011  . Hypertension     SURGICAL HISTORY: No past surgical history on file.  MEDICATIONS: Current Outpatient Prescriptions  Medication Sig Dispense Refill  . sitaGLIPtan-metformin (JANUMET) 50-1000 MG per tablet Take 1 tablet by mouth 3 (three) times daily before meals.        Current Facility-Administered Medications  Medication Dose Route Frequency Provider Last Rate Last Dose  . influenza  inactive virus vaccine (FLUZONE/FLUARIX) injection 0.5 mL  0.5 mL Intramuscular Prior to discharge Jethro Bolus, MD   0.5 mL at 07/27/11 1120    ALLERGIES:   has no known allergies.  REVIEW OF SYSTEMS:  The rest of the 14-point review of system was negative.   Filed Vitals:   07/27/11 1010  BP: 125/80  Pulse: 93  Temp: 97.6 F (36.4 C)   Wt Readings from Last 3 Encounters:  07/27/11 178 lb 4.8  oz (80.876 kg)  05/18/11 173 lb (78.472 kg)  10/28/10 162 lb 6.1 oz (73.656 kg)   ECOG Performance status: 0  PHYSICAL EXAMINATION:   General:  well-nourished in no acute distress.  Eyes:  no scleral icterus.  ENT:  There were no oropharyngeal lesions.  Neck was without thyromegaly.  Lymphatics:  Negative cervical, supraclavicular or axillary adenopathy.  Respiratory: lungs were clear bilaterally without wheezing or crackles.  Cardiovascular:  Regular rate and rhythm, S1/S2, without murmur, rub or gallop.  There was no pedal edema.  GI:  abdomen was soft, flat, nontender, nondistended, without organomegaly.  Muscoloskeletal:  no spinal tenderness of palpation of vertebral spine.  Skin exam was without echymosis, petichae.  Neuro exam was nonfocal.  Patient was able to get on and off exam table without assistance.  Gait was normal.  Patient was alerted and oriented.  Attention was good.   Language was appropriate.  Mood was normal without depression.  Speech was not pressured.  Thought content was not tangential.       LABORATORY/RADIOLOGY DATA:  Lab Results  Component Value Date   WBC 6.4 07/26/2011   HGB 13.2 07/26/2011   HCT 39.2 07/26/2011   PLT 160 07/26/2011   GLUCOSE 222* 07/26/2011   ALT 31 05/18/2011   AST 43* 07/26/2011   NA 139 07/26/2011   K 4.4 07/26/2011   CL 105 07/26/2011   CREATININE 0.8 07/26/2011   BUN 13 07/26/2011   CO2 27 07/26/2011   INR 0.92 01/25/2011   HGBA1C  Value: 8.8 (NOTE)  According to the ADA Clinical Practice Recommendations for 2011, when HbA1c is used as a screening test:   >=6.5%   Diagnostic of Diabetes Mellitus           (if abnormal result  is confirmed)  5.7-6.4%   Increased risk of developing Diabetes Mellitus  References:Diagnosis and Classification of Diabetes Mellitus,Diabetes Care,2011,34(Suppl 1):S62-S69 and Standards of Medical Care in         Diabetes - 2011,Diabetes  Care,2011,34  (Suppl 1):S11-S61.* 01/25/2011   IMAGING:  I personally reviewed the following CT and showed the images to the patient and his daughter.  In brief, there was fibrosis in the right hilum.  There was no evidence of recurrent or metastatic disease.   Ct Chest W Contrast  07/26/2011  *RADIOLOGY REPORT*  Clinical Data: Follow-up lung cancer status post completion of Griffin and radiation therapy in.  CT CHEST WITH CONTRAST  Technique:  Multidetector CT imaging of the chest was performed following the standard protocol during bolus administration of intravenous contrast.  Contrast: 80mL OMNIPAQUE IOHEXOL 300 MG/ML IV SOLN  Comparison: Chest CT 02/09/2011.  PET CT 03/12.  Findings: There is progressive volume loss in the right hemithorax with increased ill-defined soft tissue surrounding the right hilum. Perihilar opacities medially in the right lung are associated with mild bronchiectasis and most likely represent evolving radiation pneumonitis/fibrosis.  There is no well-defined mass or endobronchial lesion.  The left lung is clear.  Small mediastinal lymph nodes are unchanged.  There are no enlarged mediastinal or discrete hilar lymph nodes.  There is no significant pleural or pericardial effusion.  A small amount of pericardial thickening is stable.  Bilateral gynecomastia is again noted.  There is progressive hepatic low density consistent with severe steatosis.  No focal liver lesions are identified.  The adrenal glands appear normal.  There are no suspicious osseous lesions.  IMPRESSION:  1.  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  There is no well-defined mass to strongly suggest local recurrence.  Continued follow-up is recommended. 2.  No evidence of distant metastases. 3.  Progressive hepatic steatosis.  Original Report Authenticated By: Gerrianne Scale, M.D.    ASSESSMENT AND PLAN:   1. History of stage IIIB non-small cell lung cancer.  There is no  evidence of recurrence or metastatic disease on today clinical history, physical exam, lab, or CT.  I advised him to refrain from smoking which he has.  Next CT is in about 8 months from now.  I'll see him in about 4 months.  2. Neuropathy.  Resolved.  3. 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  He takes Tylenol prn. 4. Elevated AST/ALT:  No evidence of met on CT.  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  I advised him to watch out for further weight gain.  5. Diabetes type 2.  He is on sitagliptin/metformin and insulin per PCP. 6. Followup.  With me in 4 months. 7. Prim care:  We discussed the pros and cons of influenza vaccination; and he expressed informed understanding and wished to proceed.

## 2011-08-03 ENCOUNTER — Encounter: Payer: Self-pay | Admitting: *Deleted

## 2011-08-03 ENCOUNTER — Telehealth: Payer: Self-pay | Admitting: *Deleted

## 2011-08-03 DIAGNOSIS — K760 Fatty (change of) liver, not elsewhere classified: Secondary | ICD-10-CM | POA: Insufficient documentation

## 2011-08-03 DIAGNOSIS — J189 Pneumonia, unspecified organism: Secondary | ICD-10-CM | POA: Insufficient documentation

## 2011-08-03 DIAGNOSIS — E119 Type 2 diabetes mellitus without complications: Secondary | ICD-10-CM | POA: Insufficient documentation

## 2011-08-03 DIAGNOSIS — E1140 Type 2 diabetes mellitus with diabetic neuropathy, unspecified: Secondary | ICD-10-CM | POA: Insufficient documentation

## 2011-08-03 DIAGNOSIS — E785 Hyperlipidemia, unspecified: Secondary | ICD-10-CM | POA: Insufficient documentation

## 2011-08-03 NOTE — Progress Notes (Signed)
Stage III A non-small-cell ca,   Radiation therapy 11/15/10-03/04/11  Chemotherapy Carboplatin/Taxol concluded 03/09/11    Pt immigrant from  [employer], Single,  4 children,  Akllergies: [employer]

## 2011-08-03 NOTE — Telephone Encounter (Signed)
Called and left voice message on [phone] interpreter scheduler Sunday Spillers, requesting  [place] interpreter for pt on  dec.10,2012 at 930am with Dr. Roselind Messier Radiation Oncology at the [facility], requesting call back for confirmation

## 2011-08-07 ENCOUNTER — Ambulatory Visit
Admission: RE | Admit: 2011-08-07 | Discharge: 2011-08-07 | Disposition: A | Discharge status: Home / Self Care | Payer: Medicaid Other | Source: Ambulatory Visit | Attending: Radiation Oncology | Admitting: Radiation Oncology

## 2011-08-07 ENCOUNTER — Encounter: Payer: Self-pay | Admitting: Radiation Oncology

## 2011-08-07 VITALS — BP 106/67 | HR 94 | Temp 97.3°F | Resp 20 | Wt 178.8 lb

## 2011-08-07 DIAGNOSIS — C3490 Malignant neoplasm of unspecified part of unspecified bronchus or lung: Secondary | ICD-10-CM

## 2011-08-07 DIAGNOSIS — Z85118 Personal history of other malignant neoplasm of bronchus and lung: Secondary | ICD-10-CM

## 2011-08-07 NOTE — Progress Notes (Signed)
CC:   Exie Parody, M.D. Ralene Ok, M.D. Clinton D. Maple Hudson, MD, FCCP, FACP  DIAGNOSIS:  Locally advanced non-small cell lung cancer.  INTERVAL SINCE RADIATION THERAPY:  Seven months.  NARRATIVE:  Mr. Speyer comes in today for routine followup.  He is accompanied by an interpreter today.  The patient clinically seems to be doing well at this time.  He denies any pain in the chest area, shortness of breath or cough.  The patient denies any new bony pain, headaches, dizziness or blurred vision.  The patient did undergo a CT scan of the chest on November 28th which showed radiation pneumonitis/fibrosis in the right perihilar area.  There was no evidence of residual mass or metastasis on the patient's chest CT.  The patient does continue to follow up in Medical Oncology with Dr. Gaylyn Rong and his staff.  PHYSICAL EXAMINATION:  Vital Signs:  The patient's temperature is 97.3, pulse 94, blood pressure 106/67.  Weight is 178 pounds, which is stable. Lungs:  Examination of the lungs reveals them to be clear.  Heart:  The heart has a regular rhythm and rate. Lymphatics:  There is no palpable axillary or supraclavicular adenopathy.  IMPRESSION AND PLAN:  The patient seems to be doing well at this time with CT scan showing no obvious residual disease.  The patient will return for routine followup in Radiation Oncology in 4-6 months and will continue closer followup with medical oncology.    ______________________________ Billie Lade, Ph.D., M.D. JDK/MEDQ  D:  08/07/2011  T:  08/07/2011  Job:  [id number]

## 2011-08-07 NOTE — Progress Notes (Signed)
Pt here follow up lung ca, no c/o pain or discomfort per interpreter Nicki [place], pt eating well, no meds stated , non prod cough at times only \\9 :29 AM

## 2011-08-07 NOTE — Progress Notes (Signed)
Pt is taking his januet/metforrmin med  9:30 AM

## 2011-08-08 DIAGNOSIS — Z85118 Personal history of other malignant neoplasm of bronchus and lung: Secondary | ICD-10-CM

## 2011-11-27 ENCOUNTER — Other Ambulatory Visit: Payer: Medicaid Other | Admitting: Lab

## 2011-11-27 ENCOUNTER — Telehealth: Payer: Self-pay | Admitting: Oncology

## 2011-11-27 ENCOUNTER — Ambulatory Visit: Payer: Medicaid Other | Admitting: Oncology

## 2011-11-27 ENCOUNTER — Ambulatory Visit (HOSPITAL_BASED_OUTPATIENT_CLINIC_OR_DEPARTMENT_OTHER): Payer: Medicaid Other | Admitting: Oncology

## 2011-11-27 VITALS — BP 113/74 | HR 98 | Temp 97.1°F | Ht 66.0 in | Wt 173.6 lb

## 2011-11-27 DIAGNOSIS — M545 Low back pain: Secondary | ICD-10-CM

## 2011-11-27 DIAGNOSIS — M25519 Pain in unspecified shoulder: Secondary | ICD-10-CM

## 2011-11-27 DIAGNOSIS — Z85118 Personal history of other malignant neoplasm of bronchus and lung: Secondary | ICD-10-CM

## 2011-11-27 DIAGNOSIS — C3490 Malignant neoplasm of unspecified part of unspecified bronchus or lung: Secondary | ICD-10-CM

## 2011-11-27 LAB — CBC WITH DIFFERENTIAL/PLATELET
Basophils Absolute: 0.1 10*3/uL (ref 0.0–0.1)
HCT: 44 % (ref 38.4–49.9)
HGB: 14.7 g/dL (ref 13.0–17.1)
LYMPH%: 35.3 % (ref 14.0–49.0)
MCH: 30 pg (ref 27.2–33.4)
MCHC: 33.3 g/dL (ref 32.0–36.0)
MONO#: 0.8 10*3/uL (ref 0.1–0.9)
NEUT%: 47.5 % (ref 39.0–75.0)
Platelets: 182 10*3/uL (ref 140–400)
WBC: 6.9 10*3/uL (ref 4.0–10.3)
lymph#: 2.5 10*3/uL (ref 0.9–3.3)

## 2011-11-27 LAB — COMPREHENSIVE METABOLIC PANEL
ALT: 63 U/L — ABNORMAL HIGH (ref 0–53)
AST: 40 U/L — ABNORMAL HIGH (ref 0–37)
BUN: 18 mg/dL (ref 6–23)
Calcium: 9.9 mg/dL (ref 8.4–10.5)
Chloride: 99 mEq/L (ref 96–112)
Creatinine, Ser: 0.86 mg/dL (ref 0.50–1.35)
Total Bilirubin: 0.5 mg/dL (ref 0.3–1.2)

## 2011-11-27 NOTE — Progress Notes (Signed)
[facility] OFFICE PROGRESS NOTE  No primary provider on file.  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  PAST THERAPY:  Concurrent definitive chemoradiation with weekly carboplatin/Taxol and daily radiation between 11/21/2010 and 01/02/2011.  Status post consolidative chemotherapy with every 3 week carboplatin/Taxol x2 cycles which concluded on 03/18/2011.  CURRENT THERAPY:  Watchful observation and surveillance.  INTERVAL HISTORY: George Griffin 51 y.o. male returns for regular follow up.  He reported crampy bilateral lower back pain and bilateral shoulder pain when he wakes up in the morning.  Pain resolved with Tylenol and improves as the day goes on.  He denies lower extremity weakness, paresthesia, bowel/bladder incontinence.  He has good appetite and no weight loss.  He denies fatigue.  He no longer smokes cigarettes.   Patient denies headache, visual changes, confusion, drenching night sweats, palpable lymph node swelling, mucositis, odynophagia, dysphagia, nausea vomiting, jaundice, chest pain, palpitation, shortness of breath, dyspnea on exertion, productive cough, gum bleeding, epistaxis, hematemesis, hemoptysis, abdominal pain, abdominal swelling, early satiety, melena, hematochezia, hematuria, skin rash, spontaneous bleeding, joint swelling, joint pain, heat or cold intolerance, depression, suicidal or homocidal ideation, feeling hopelessness.    MEDICAL HISTORY: Past Medical History  Diagnosis Date  . Diabetes mellitus     metfomin  . Lung cancer     lung ca dx5/02/2011  . Hypertension   . ED (erectile dysfunction)   . Hyperlipidemia   . Pneumonia     hx  . Neuropathy of finger     mild s/p chemotherapy  . Hepatic steatosis 07/26/11    severe ct chest  . Constipation     SURGICAL HISTORY: No past surgical history on file.  MEDICATIONS: Current Outpatient Prescriptions    Medication Sig Dispense Refill  . acetaminophen (TYLENOL) 325 MG tablet Take 650 mg by mouth every 6 (six) hours as needed.        . docusate sodium (COLACE) 100 MG capsule Take 100 mg by mouth 3 (three) times daily as needed.        . sitaGLIPtan-metformin (JANUMET) 50-1000 MG per tablet Take 1 tablet by mouth 3 (three) times daily before meals.       Marland Kitchen zolpidem (AMBIEN) 10 MG tablet Take 10 mg by mouth at bedtime as needed.          ALLERGIES:   has no known allergies.  REVIEW OF SYSTEMS:  The rest of the 14-point review of system was negative.   Filed Vitals:   11/27/11 0952  BP: 113/74  Pulse: 98  Temp: 97.1 F (36.2 C)   Wt Readings from Last 3 Encounters:  11/27/11 173 lb 9.6 oz (78.744 kg)  08/07/11 178 lb 12.8 oz (81.103 kg)  07/27/11 178 lb 4.8 oz (80.876 kg)   ECOG Performance status: 0  PHYSICAL EXAMINATION:   General:  well-nourished in no acute distress.  Eyes:  no scleral icterus.  ENT:  There were no oropharyngeal lesions.  Neck was without thyromegaly.  Lymphatics:  Negative cervical, supraclavicular or axillary adenopathy.  Respiratory: lungs were clear bilaterally without wheezing or crackles.  Cardiovascular:  Regular rate and rhythm, S1/S2, without murmur, rub or gallop.  There was no pedal edema.  GI:  abdomen was soft, flat, nontender, nondistended, without organomegaly.  Muscoloskeletal:  no spinal tenderness of palpation of vertebral spine or shoulders.  Skin exam was without echymosis, petichae.  Neuro exam was nonfocal.  Patient was  able to get on and off exam table without assistance.  Gait was normal.  Patient was alerted and oriented.  Attention was good.   Language was appropriate.  Mood was normal without depression.  Speech was not pressured.  Thought content was not tangential.       LABORATORY/RADIOLOGY DATA:  Lab Results  Component Value Date   WBC 6.9 11/27/2011   HGB 14.7 11/27/2011   HCT 44.0 11/27/2011   PLT 182 11/27/2011   GLUCOSE 222*  07/26/2011   ALT 31 05/18/2011   AST 43* 07/26/2011   NA 139 07/26/2011   K 4.4 07/26/2011   CL 105 07/26/2011   CREATININE 0.8 07/26/2011   BUN 13 07/26/2011   CO2 27 07/26/2011   INR 0.92 01/25/2011   HGBA1C  Value: 8.8 (NOTE)                                                                       According to the ADA Clinical Practice Recommendations for 2011, when HbA1c is used as a screening test:   >=6.5%   Diagnostic of Diabetes Mellitus           (if abnormal result  is confirmed)  5.7-6.4%   Increased risk of developing Diabetes Mellitus  References:Diagnosis and Classification of Diabetes Mellitus,Diabetes Care,2011,34(Suppl 1):S62-S69 and Standards of Medical Care in         Diabetes - 2011,Diabetes Care,2011,34  (Suppl 1):S11-S61.* 01/25/2011    ASSESSMENT AND PLAN:   1. History of stage IIIB non-small cell lung cancer.  There is no evidence of recurrence or metastatic disease on today clinical history, physical exam, lab.  I ordered today follow up CT chest/abd/pel in about 4 months.   2. Neuropathy.  Resolved.  3. Bilateral shoulder pain and back pain:  Most likely consistent with OA since pain improves with activities and resolves with Tylenol.  I have low clinical suspicion for bone met.   4. Elevated AST/ALT:  From NASH.  I advised him to watch his weight as not to gain much more.  5. Diabetes type 2.  He is on sitagliptin/metformin and insulin per PCP. 6. Followup.  With me in 4 months.  The length of time of the face-to-face encounter was 15 minutes. More than 50% of time was spent counseling and coordination of care.

## 2011-11-27 NOTE — Telephone Encounter (Signed)
appts made and printed for pt aom °

## 2012-02-22 IMAGING — CT CT CHEST W/ CM
2 of 3 series · 15 of 36 positions shown, 18 images · IV contrast (agent unspecified)
Comparison: 10/25/2010

CLINICAL DATA: Follow-up lung carcinoma. Complete chemotherapy and
radiation therapy.

CT CHEST WITH CONTRAST
TECHNIQUE: Multidetector CT imaging of the chest was performed
following the standard protocol during bolus administration of
intravenous contrast.
Contrast: 80 ml Imnipaque-CYY

[Series 2: chest with st · axial · 0.69mm/px · z∈[-288,-52]mm · 12 of 57 slices shown, 15 images]
[im 5/57  mediastinal]
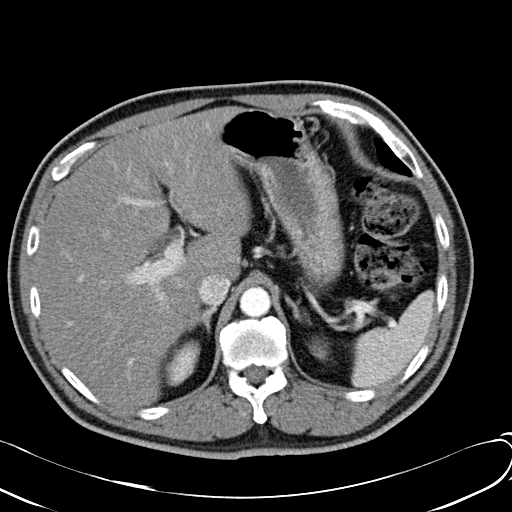
[im 5/57  lung]
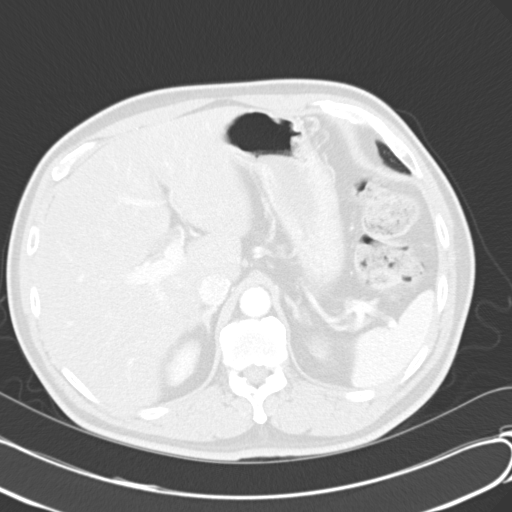
[im 9/57  lung]
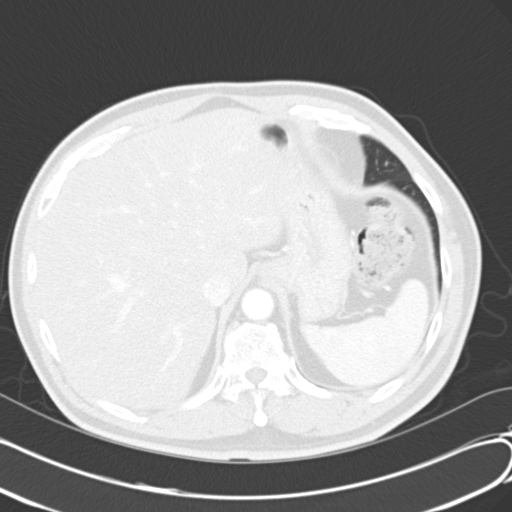
[im 13/57  lung]
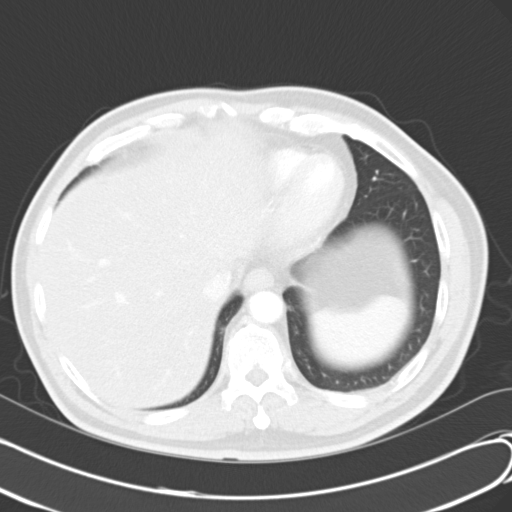
[im 17/57  lung]
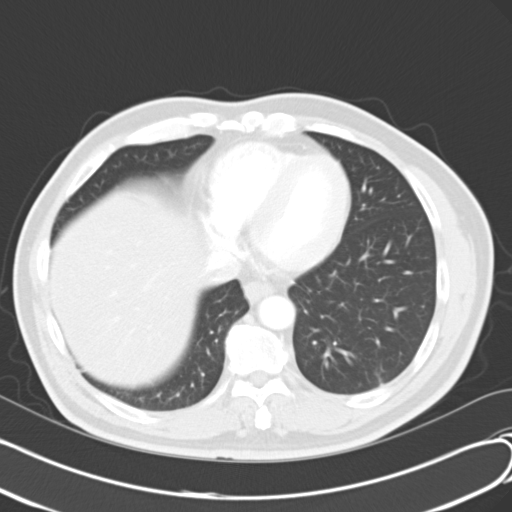
[im 21/57  mediastinal]
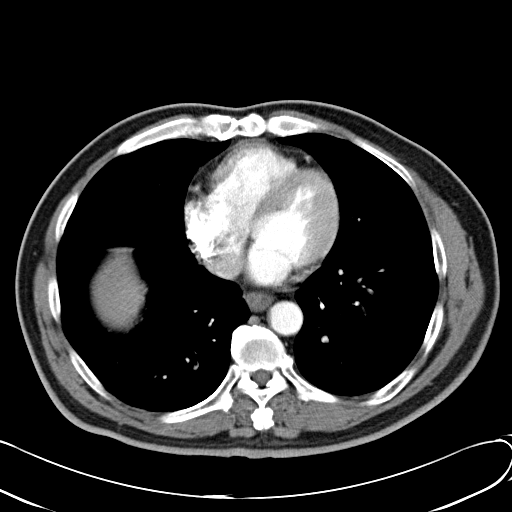
[im 21/57  lung]
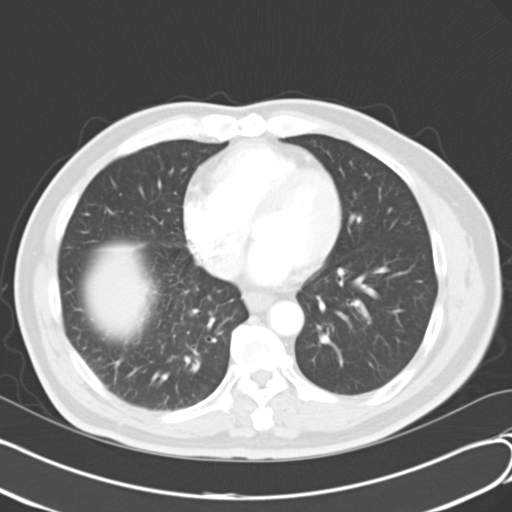
[im 25/57  lung]
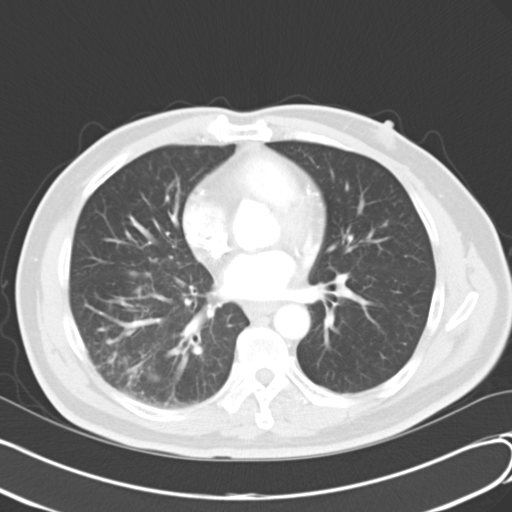
[im 32/57  lung]
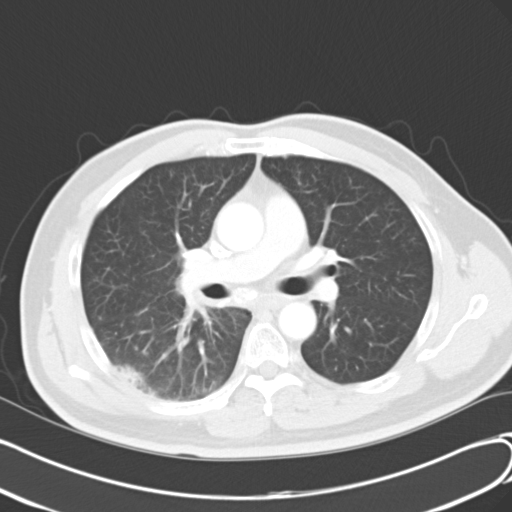
[im 36/57  lung]
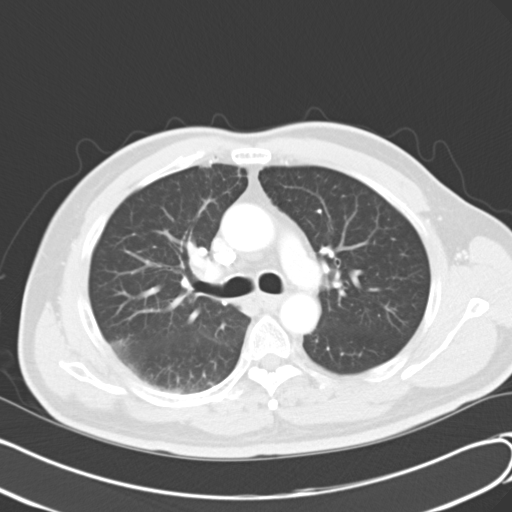
[im 40/57  mediastinal]
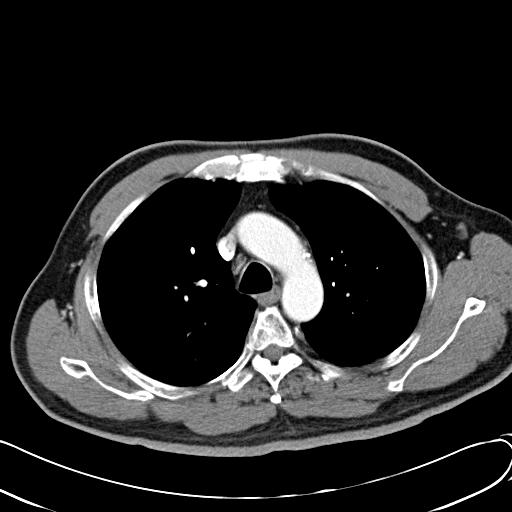
[im 40/57  lung]
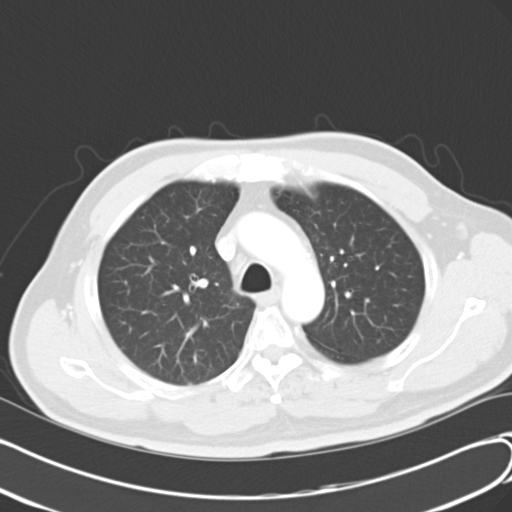
[im 44/57  lung]
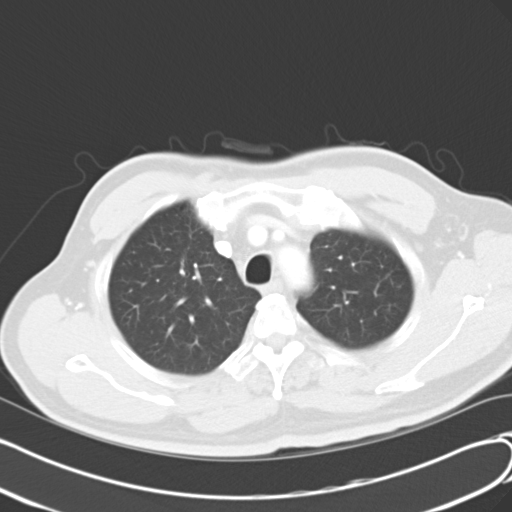
[im 48/57  lung]
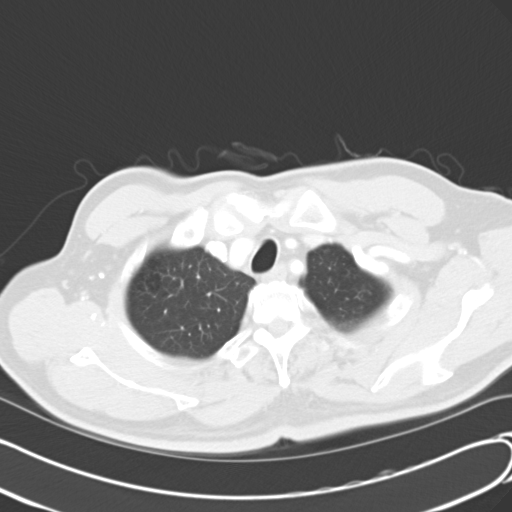
[im 52/57  lung]
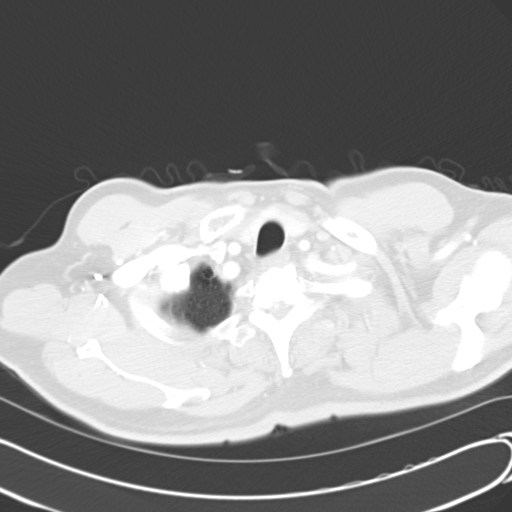

[Series 602: <mpr thick range> · coronal · 0.69mm/px · 3 of 83 slices shown]
[im 17/83  lung]
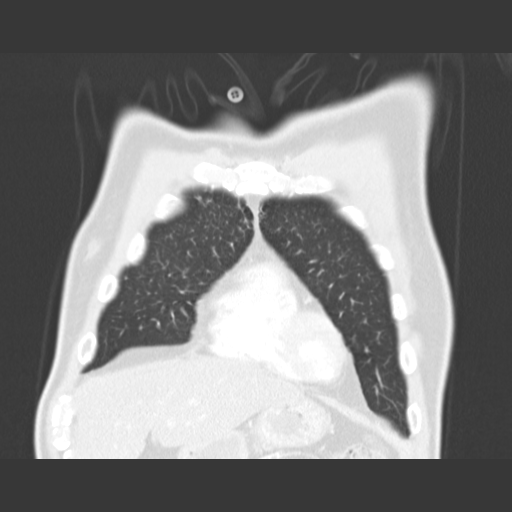
[im 33/83  lung]
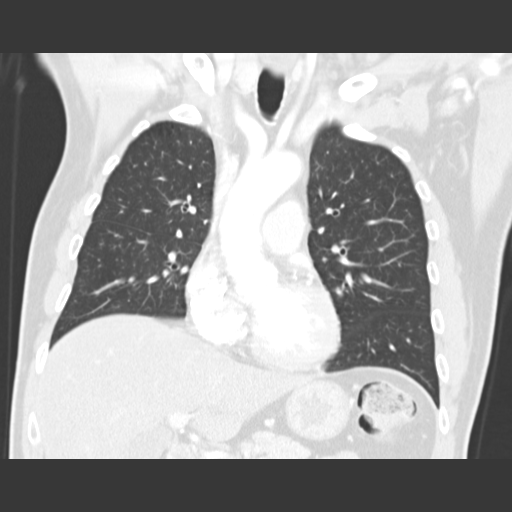
[im 50/83  lung]
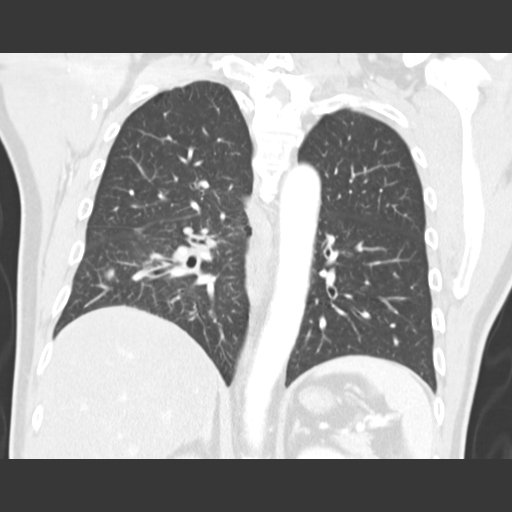

[15 of 36 positions shown; findings below may reference images not displayed]

FINDINGS: There has been interval resolution of the central right
lower lobe mass since prior exam.  Previously seen endobronchial
obstruction and postobstructive pneumonitis has also resolved.

There is been interval resolution of subcarinal and azygo-
esophageal recess lymphadenopathy since prior exam.  Subcarinal
lymph node calcification is now demonstrated in this region.  Right
hilar lymphadenopathy is also nearly completely resolved since
prior exam.  Stable shotty less than 1 cm right paratracheal lymph
node is unchanged, and no new lymphadenopathy is identified.

There is a new heterogeneous air space opacity in the lateral right
lower lobe, peripheral to the location of the previously seen lung
mass, which is suspicious for radiation pneumonitis. There is no
evidence of pleural effusion.

Both adrenal glands remain normal appearance.  Increased hepatic
steatosis is seen since previous study.  No suspicious bone lesions
are identified.
IMPRESSION: 1.  Interval resolution of central right lower lobe mass.
2.  Near complete resolution of right hilar and mediastinal
lymphadenopathy.
3.  New heterogeneous air space opacity in the lateral right lower
lobe, peripheral to the location of the previously seen mass,
suspicious for radiation pneumonitis.
4.  Increased hepatic steatosis.

## 2012-03-25 ENCOUNTER — Other Ambulatory Visit (HOSPITAL_BASED_OUTPATIENT_CLINIC_OR_DEPARTMENT_OTHER): Payer: Medicaid Other

## 2012-03-25 ENCOUNTER — Ambulatory Visit (HOSPITAL_COMMUNITY)
Admission: RE | Admit: 2012-03-25 | Discharge: 2012-03-25 | Disposition: A | Discharge status: Home / Self Care | Payer: Medicaid Other | Source: Ambulatory Visit | Attending: Oncology | Admitting: Oncology

## 2012-03-25 DIAGNOSIS — K7689 Other specified diseases of liver: Secondary | ICD-10-CM | POA: Insufficient documentation

## 2012-03-25 DIAGNOSIS — E279 Disorder of adrenal gland, unspecified: Secondary | ICD-10-CM | POA: Insufficient documentation

## 2012-03-25 DIAGNOSIS — I708 Atherosclerosis of other arteries: Secondary | ICD-10-CM | POA: Insufficient documentation

## 2012-03-25 DIAGNOSIS — C3400 Malignant neoplasm of unspecified main bronchus: Secondary | ICD-10-CM

## 2012-03-25 DIAGNOSIS — I2510 Atherosclerotic heart disease of native coronary artery without angina pectoris: Secondary | ICD-10-CM | POA: Insufficient documentation

## 2012-03-25 DIAGNOSIS — E119 Type 2 diabetes mellitus without complications: Secondary | ICD-10-CM | POA: Insufficient documentation

## 2012-03-25 DIAGNOSIS — Z79899 Other long term (current) drug therapy: Secondary | ICD-10-CM | POA: Insufficient documentation

## 2012-03-25 DIAGNOSIS — C3490 Malignant neoplasm of unspecified part of unspecified bronchus or lung: Secondary | ICD-10-CM | POA: Insufficient documentation

## 2012-03-25 DIAGNOSIS — N62 Hypertrophy of breast: Secondary | ICD-10-CM | POA: Insufficient documentation

## 2012-03-25 LAB — CBC WITH DIFFERENTIAL/PLATELET
Basophils Absolute: 0.1 10*3/uL (ref 0.0–0.1)
EOS%: 4 % (ref 0.0–7.0)
Eosinophils Absolute: 0.4 10*3/uL (ref 0.0–0.5)
HGB: 15.1 g/dL (ref 13.0–17.1)
LYMPH%: 33 % (ref 14.0–49.0)
MCH: 30 pg (ref 27.2–33.4)
MCV: 90.2 fL (ref 79.3–98.0)
MONO%: 9 % (ref 0.0–14.0)
NEUT#: 5.1 10*3/uL (ref 1.5–6.5)
NEUT%: 53.2 % (ref 39.0–75.0)
Platelets: 193 10*3/uL (ref 140–400)

## 2012-03-25 LAB — CMP (CANCER CENTER ONLY)
Albumin: 4 g/dL (ref 3.3–5.5)
Alkaline Phosphatase: 44 U/L (ref 26–84)
BUN, Bld: 15 mg/dL (ref 7–22)
Creat: 0.9 mg/dl (ref 0.6–1.2)
Glucose, Bld: 194 mg/dL — ABNORMAL HIGH (ref 73–118)
Total Bilirubin: 0.7 mg/dl (ref 0.20–1.60)

## 2012-03-25 MED ORDER — IOHEXOL 300 MG/ML  SOLN
100.0000 mL | Freq: Once | INTRAMUSCULAR | Status: AC | PRN
  Administered 2012-03-25: 100 mL via INTRAVENOUS

## 2012-03-29 NOTE — Patient Instructions (Addendum)
A.  CT result from 03/24/2012.  CT CHEST  Findings: Lung windows demonstrate clear left lung. Similar to  slight decrease 3in the right perihilar airspace opacity and  traction bronchiectasis, most consistent with evolving radiation  change. No well-defined mass to suggest residual tumor. A subtle  nodular focus within the right lower lobe measures 7 mm medially on  image 29 and is not definitely present on the prior exam.  Soft tissue windows demonstrate no supraclavicular adenopathy.  Mild bilateral gynecomastia. Age advanced atherosclerosis within  the left subclavian artery and coronary arteries. Normal heart size  without pericardial or pleural effusion. No central pulmonary  embolism, on this non-dedicated study. Stable small middle  mediastinal nodes, including a calcified subcarinal node. No well-  defined adenopathy.   IMPRESSION:   1. Similar to slight decrease in evolving radiation change in the  right perihilar region. No well-defined residual disease  identified.  2. An adjacent nodular focus, could also be related to evolving  radiation change or represent a true nodule. Recommend attention on  follow-up.  3. Age advanced coronary artery atherosclerosis. Recommend  assessment of coronary risk factors and consideration of medical  therapy.   CT ABDOMEN AND PELVIS   Findings: Moderate hepatic steatosis, slightly improved. Probable  perfusion anomaly at hepatic dome on image 36 and 37, unchanged.  Normal spleen, stomach, pancreas, gallbladder, biliary tract, right  adrenal gland. Mild left adrenal nodularity is similar back to  01/25/2011 and nonspecific.  Normal kidneys. Age advanced aortic atherosclerosis. Small  retroperitoneal nodes, without adenopathy.  Normal colon and terminal ileum. Normal small bowel without  abdominal ascites.  No pelvic adenopathy. Normal urinary bladder and prostate. No  significant free fluid. Partial degenerative fusion of the    bilateral sacroiliac joints. No acute osseous abnormality.   IMPRESSION:   1. No acute process or evidence of metastatic disease in the  abdomen or pelvis.  2. Moderate hepatic steatosis, slightly decreased since the prior exam.  B.  Follow up:  CT chest in about 6 months the day prior to visit.

## 2012-04-01 ENCOUNTER — Telehealth: Payer: Self-pay | Admitting: Oncology

## 2012-04-01 ENCOUNTER — Ambulatory Visit (HOSPITAL_BASED_OUTPATIENT_CLINIC_OR_DEPARTMENT_OTHER): Payer: Medicaid Other | Admitting: Oncology

## 2012-04-01 VITALS — BP 122/78 | HR 106 | Temp 97.7°F | Resp 18 | Ht 66.0 in | Wt 175.6 lb

## 2012-04-01 DIAGNOSIS — C3490 Malignant neoplasm of unspecified part of unspecified bronchus or lung: Secondary | ICD-10-CM

## 2012-04-01 DIAGNOSIS — R197 Diarrhea, unspecified: Secondary | ICD-10-CM

## 2012-04-01 DIAGNOSIS — C3430 Malignant neoplasm of lower lobe, unspecified bronchus or lung: Secondary | ICD-10-CM

## 2012-04-01 DIAGNOSIS — E119 Type 2 diabetes mellitus without complications: Secondary | ICD-10-CM

## 2012-04-01 DIAGNOSIS — M549 Dorsalgia, unspecified: Secondary | ICD-10-CM

## 2012-04-01 NOTE — Telephone Encounter (Signed)
appts made and printed for pt aom °

## 2012-04-01 NOTE — Progress Notes (Signed)
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   PAST THERAPY: Concurrent definitive chemoradiation with weekly carboplatin/Taxol and daily radiation between 11/21/2010 and 01/02/2011. Status post consolidative chemotherapy with every 3 week carboplatin/Taxol x2 cycles which concluded on 03/18/2011.   CURRENT THERAPY: Watchful observation and surveillance.   INTERVAL HISTORY: George Griffin 51 y.o. male returns for regular follow up with his daughter.  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  He could not stand it since it was too hot for him.  He has good appetite and continues to gain weight. He is having problem with his fasting glucose ranging from 150-200.  He no longer smokes cigarettes.  He noticed that for the past two weeks, whenever he eats, he would have diarrhea stools of about 4-5 x daily.  He has had bilateral shoulder pain; crampy no radiation, worst in the morning; improved with activities.   Patient denies fever, anorexia, weight loss, fatigue, headache, visual changes, confusion, drenching night sweats, palpable lymph node swelling, mucositis, odynophagia, dysphagia, nausea vomiting, jaundice, chest pain, palpitation, shortness of breath, dyspnea on exertion, productive cough, gum bleeding, epistaxis, hematemesis, hemoptysis, abdominal pain, abdominal swelling, early satiety, melena, hematochezia, hematuria, skin rash, spontaneous bleeding, joint swelling, heat or cold intolerance, bowel bladder incontinence, focal motor weakness, paresthesia, depression, suicidal or homicidal ideation, feeling hopelessness.   Past Medical History  Diagnosis Date  . Diabetes mellitus     metfomin  . Lung cancer     lung ca dx5/02/2011  . Hypertension   . ED (erectile dysfunction)     . Hyperlipidemia   . Pneumonia     hx  . Neuropathy of finger     mild s/p chemotherapy  . Hepatic steatosis 07/26/11    severe ct chest  . Constipation     No past surgical history on file.  Current Outpatient Prescriptions  Medication Sig Dispense Refill  . acetaminophen (TYLENOL) 325 MG tablet Take 650 mg by mouth every 6 (six) hours as needed.        . insulin aspart (NOVOLOG) 100 UNIT/ML injection Inject 15 Units into the skin 2 (two) times daily before a meal.      . metformin (FORTAMET) 1000 MG (OSM) 24 hr tablet Take 1 tablet by mouth Daily.      . sitaGLIPtan-metformin (JANUMET) 50-1000 MG per tablet Take 1 tablet by mouth 3 (three) times daily before meals.         ALLERGIES:   has no known allergies.  REVIEW OF SYSTEMS:  The rest of the 14-point review of system was negative.   Filed Vitals:   04/01/12 1413  BP: 122/78  Pulse: 106  Temp: 97.7 F (36.5 C)  Resp: 18   Wt Readings from Last 3 Encounters:  04/01/12 175 lb 9.6 oz (79.652 kg)  11/27/11 173 lb 9.6 oz (78.744 kg)  08/07/11 178 lb 12.8 oz (81.103 kg)   ECOG Performance status: 1  PHYSICAL EXAMINATION:   General:  well-nourished man in no acute distress.  Eyes:  no scleral icterus.  ENT:  There were no oropharyngeal lesions.  Neck was without thyromegaly.  Lymphatics:  Negative cervical, supraclavicular or axillary adenopathy.  Respiratory: lungs were clear bilaterally without wheezing or crackles.  Cardiovascular:  Regular rate and rhythm,  S1/S2, without murmur, rub or gallop.  There was no pedal edema.  GI:  abdomen was soft, flat, nontender, nondistended, without organomegaly.  Muscoloskeletal:  no spinal tenderness of palpation of vertebral spine.  Skin exam was without echymosis, petichae.  Neuro exam was nonfocal.  Patient was able to get on and off exam table without assistance.  Gait was normal.  Patient was alerted and oriented.  Attention was good.   Language was appropriate.  Mood was normal  without depression.  Speech was not pressured.  Thought content was not tangential.    LABORATORY/RADIOLOGY DATA:  Lab Results  Component Value Date   WBC 9.6 03/25/2012   HGB 15.1 03/25/2012   HCT 45.5 03/25/2012   PLT 193 03/25/2012   GLUCOSE 194* 03/25/2012   ALKPHOS 44 03/25/2012   ALT 63* 11/27/2011   AST 33 03/25/2012   NA 140 03/25/2012   K 4.9* 03/25/2012   CL 97* 03/25/2012   CREATININE 0.9 03/25/2012   BUN 15 03/25/2012   CO2 27 03/25/2012   INR 0.92 01/25/2011   HGBA1C  Value: 8.8 (NOTE)                                                                       According to the ADA Clinical Practice Recommendations for 2011, when HbA1c is used as a screening test:   >=6.5%   Diagnostic of Diabetes Mellitus           (if abnormal result  is confirmed)  5.7-6.4%   Increased risk of developing Diabetes Mellitus  References:Diagnosis and Classification of Diabetes Mellitus,Diabetes Care,2011,34(Suppl 1):S62-S69 and Standards of Medical Care in         Diabetes - 2011,Diabetes Care,2011,34  (Suppl 1):S11-S61.* 01/25/2011      03/25/2012    CT CHEST Findings: Lung windows demonstrate clear left lung.  Similar to slight decrease 3in the right perihilar airspace opacity and traction bronchiectasis, most consistent with evolving radiation change.  No well-defined mass to suggest residual tumor.  A subtle nodular focus within the right lower lobe measures 7 mm medially on image 29 and is not definitely present on the prior exam.  Soft tissue windows demonstrate no supraclavicular adenopathy. Mild bilateral gynecomastia.  Age advanced atherosclerosis within the left subclavian artery and coronary arteries. Normal heart size without pericardial or pleural effusion.  No central pulmonary embolism, on this non-dedicated study.  Stable small middle mediastinal nodes, including a calcified subcarinal node.  No well- defined adenopathy.    IMPRESSION:  1.  Similar to slight decrease in evolving radiation change in  the right perihilar region.  No well-defined residual disease identified. 2.  An adjacent nodular focus, could also be related to evolving radiation change or represent a true nodule. Recommend attention on follow-up. 3. Age advanced coronary artery atherosclerosis.  Recommend assessment of coronary risk factors and consideration of medical therapy.    CT ABDOMEN AND PELVIS   Findings:  Moderate hepatic steatosis, slightly improved.  Probable perfusion anomaly at hepatic dome on image 36 and 37, unchanged. Normal spleen, stomach, pancreas, gallbladder, biliary tract, right adrenal gland.  Mild left adrenal nodularity is similar back to 01/25/2011 and nonspecific.  Normal kidneys.  Age advanced aortic atherosclerosis.  Small  retroperitoneal nodes, without adenopathy.  Normal colon and terminal ileum.  Normal small bowel without abdominal ascites.    No pelvic adenopathy.    Normal urinary bladder and prostate.  No significant free fluid.  Partial degenerative fusion of the bilateral sacroiliac joints. No acute osseous abnormality.    IMPRESSION:  1. No acute process or evidence of metastatic disease in the abdomen or pelvis. 2.  Moderate hepatic steatosis, slightly decreased since the prior exam.     ASSESSMENT AND PLAN:   1. History of stage IIIB non-small cell lung cancer. There is no evidence of recurrence or metastatic disease on today clinical history, physical exam, lab. I ordered today follow up CT chest/abd/pel in about 6 months.  There was scar formation in the right middle lobe.  However, there was not convincing enough evidence for recurrent lung cancer.  2. Neuropathy. Resolved.  3. Bilateral shoulder pain and back pain: Most likely consistent with OA since pain improves with activities and 4. Diarrhea:  Unclear cause.  No recent antibiotic to raise concern for Cdif.  He is taking some over the counter antidiarrhea which he does not know the name off.  I advised him on Imodium prn.  If this  dose not help, I advised him to follow up with his PCP for further work up or referral to GI as appropriate  5. Diabetes type 2. He is on sitagliptin/metformin and insulin per PCP.  He is having problem controlling his DM.  I advised him to go no low carb diet and lose weight.  Hopefully, his DM control will slightly improve.  6. Followup:  Return to clinic in about 6 months.    The length of time of the face-to-face encounter was 15 minutes. More than 50% of time was spent counseling and coordination of care.

## 2012-09-30 ENCOUNTER — Other Ambulatory Visit (HOSPITAL_BASED_OUTPATIENT_CLINIC_OR_DEPARTMENT_OTHER): Payer: Medicaid Other | Admitting: Lab

## 2012-09-30 ENCOUNTER — Ambulatory Visit (HOSPITAL_COMMUNITY)
Admission: RE | Admit: 2012-09-30 | Discharge: 2012-09-30 | Disposition: A | Discharge status: Home / Self Care | Payer: Medicaid Other | Source: Ambulatory Visit | Attending: Oncology | Admitting: Oncology

## 2012-09-30 ENCOUNTER — Encounter (HOSPITAL_COMMUNITY): Payer: Self-pay

## 2012-09-30 DIAGNOSIS — K7689 Other specified diseases of liver: Secondary | ICD-10-CM | POA: Insufficient documentation

## 2012-09-30 DIAGNOSIS — C3490 Malignant neoplasm of unspecified part of unspecified bronchus or lung: Secondary | ICD-10-CM

## 2012-09-30 DIAGNOSIS — E278 Other specified disorders of adrenal gland: Secondary | ICD-10-CM | POA: Insufficient documentation

## 2012-09-30 LAB — CBC WITH DIFFERENTIAL/PLATELET
BASO%: 0.4 % (ref 0.0–2.0)
Basophils Absolute: 0 10*3/uL (ref 0.0–0.1)
Eosinophils Absolute: 0.4 10*3/uL (ref 0.0–0.5)
HCT: 42.1 % (ref 38.4–49.9)
HGB: 14.2 g/dL (ref 13.0–17.1)
MCV: 86.3 fL (ref 79.3–98.0)
MONO%: 9.4 % (ref 0.0–14.0)
NEUT#: 3.1 10*3/uL (ref 1.5–6.5)
NEUT%: 44.4 % (ref 39.0–75.0)
Platelets: 165 10*3/uL (ref 140–400)
RDW: 13.9 % (ref 11.0–14.6)

## 2012-09-30 LAB — COMPREHENSIVE METABOLIC PANEL (CC13)
ALT: 67 U/L — ABNORMAL HIGH (ref 0–55)
AST: 46 U/L — ABNORMAL HIGH (ref 5–34)
CO2: 19 mEq/L — ABNORMAL LOW (ref 22–29)
Creatinine: 0.8 mg/dL (ref 0.7–1.3)
Sodium: 137 mEq/L (ref 136–145)
Total Bilirubin: 0.43 mg/dL (ref 0.20–1.20)
Total Protein: 7.9 g/dL (ref 6.4–8.3)

## 2012-09-30 MED ORDER — IOHEXOL 300 MG/ML  SOLN
80.0000 mL | Freq: Once | INTRAMUSCULAR | Status: AC | PRN
  Administered 2012-09-30: 80 mL via INTRAVENOUS

## 2012-10-02 ENCOUNTER — Ambulatory Visit (HOSPITAL_BASED_OUTPATIENT_CLINIC_OR_DEPARTMENT_OTHER): Payer: Medicaid Other | Admitting: Oncology

## 2012-10-02 ENCOUNTER — Telehealth: Payer: Self-pay | Admitting: Oncology

## 2012-10-02 ENCOUNTER — Encounter: Payer: Self-pay | Admitting: Oncology

## 2012-10-02 VITALS — BP 141/72 | HR 94 | Temp 97.1°F | Resp 20 | Ht 66.0 in | Wt 181.9 lb

## 2012-10-02 DIAGNOSIS — C3490 Malignant neoplasm of unspecified part of unspecified bronchus or lung: Secondary | ICD-10-CM

## 2012-10-02 DIAGNOSIS — Z85118 Personal history of other malignant neoplasm of bronchus and lung: Secondary | ICD-10-CM

## 2012-10-02 NOTE — Telephone Encounter (Signed)
gv and printed appt schedule for pt for Aug...pt aware central scheduling will call with d/t for ct

## 2012-10-02 NOTE — Patient Instructions (Signed)
*  RADIOLOGY REPORT*  Clinical Data: Lung cancer follow-up. No specific chest  complaints.  CT CHEST WITH CONTRAST  Technique: Multidetector CT imaging of the chest was performed  following the standard protocol during bolus administration of  intravenous contrast.  Contrast: 80mL OMNIPAQUE IOHEXOL 300 MG/ML SOLN  Comparison: Chest CT 07/26/2011 and 03/25/2012. PET CT 11/03/2010.  Findings: There are no enlarged mediastinal or hilar lymph nodes.  A calcified subcarinal node on image 25 is stable. There is stable  right hilar fullness secondary to adjacent fibrosis.  Aortic and coronary artery atherosclerosis is again noted. The  thyroid gland appears normal. There is no pleural or pericardial  effusion.  Evolving radiation changes in the perihilar right lung are again  noted. No focal nodule or endobronchial lesion is identified. The  left lung is clear.  Images through the upper abdomen demonstrate progressive severe  hepatic steatosis. No focal lesion is evident. A tiny left  adrenal nodule is unchanged. There are no worrisome osseous  findings. Bilateral gynecomastia is again noted.  IMPRESSION:  1. Evolving right lung radiation changes.  2. No evidence of local recurrence or metastatic disease.  3. Progressive hepatic steatosis.  Original Report Authenticated By: Carey Bullocks, M.D.

## 2012-10-02 NOTE — Progress Notes (Signed)
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   PAST THERAPY: Concurrent definitive chemoradiation with weekly carboplatin/Taxol and daily radiation between 11/21/2010 and 01/02/2011. Status post consolidative chemotherapy with every 3 week carboplatin/Taxol x2 cycles which concluded on 03/18/2011.   CURRENT THERAPY: Watchful observation and surveillance.   INTERVAL HISTORY: George Griffin 52 y.o. male returns for regular follow up by himself. He has good appetite and continues to gain weight. He is having problem with his fasting glucose ranging from 150-200.  He no longer smokes cigarettes. He has had bilateral shoulder pain; crampy no radiation, worst in the morning; improved with activities. Denies shortness of breath, cough, and hemoptysis.  Patient denies fever, anorexia, weight loss, fatigue, headache, visual changes, confusion, drenching night sweats, palpable lymph node swelling, mucositis, odynophagia, dysphagia, nausea vomiting, jaundice, chest pain, palpitation, shortness of breath, dyspnea on exertion, productive cough, gum bleeding, epistaxis, hematemesis, hemoptysis, abdominal pain, abdominal swelling, early satiety, melena, hematochezia, hematuria, skin rash, spontaneous bleeding, joint swelling, heat or cold intolerance, bowel bladder incontinence, focal motor weakness, paresthesia, depression, suicidal or homicidal ideation, feeling hopelessness.   Past Medical History  Diagnosis Date  . Diabetes mellitus     metfomin  . Lung cancer     lung ca dx5/02/2011  . Hypertension   . ED (erectile dysfunction)   . Hyperlipidemia   . Pneumonia     hx  . Neuropathy of finger     mild s/p chemotherapy  . Hepatic steatosis 07/26/11    severe ct chest  . Constipation     History  reviewed. No pertinent past surgical history.  Current Outpatient Prescriptions  Medication Sig Dispense Refill  . acetaminophen (TYLENOL) 325 MG tablet Take 650 mg by mouth every 6 (six) hours as needed.        . insulin aspart (NOVOLOG) 100 UNIT/ML injection Inject 15 Units into the skin 2 (two) times daily before a meal.      . metformin (FORTAMET) 1000 MG (OSM) 24 hr tablet Take 1 tablet by mouth Daily.      . sitaGLIPtan-metformin (JANUMET) 50-1000 MG per tablet Take 1 tablet by mouth 3 (three) times daily before meals.         ALLERGIES:   has no known allergies.  REVIEW OF SYSTEMS:  The rest of the 14-point review of system was negative.   Filed Vitals:   10/02/12 1035  BP: 141/72  Pulse: 94  Temp: 97.1 F (36.2 C)  Resp: 20   Wt Readings from Last 3 Encounters:  10/02/12 181 lb 14.4 oz (82.509 kg)  04/01/12 175 lb 9.6 oz (79.652 kg)  11/27/11 173 lb 9.6 oz (78.744 kg)   ECOG Performance status: 1  PHYSICAL EXAMINATION:   General:  well-nourished man in no acute distress.  Eyes:  no scleral icterus.  ENT:  There were no oropharyngeal lesions.  Neck was without thyromegaly.  Lymphatics:  Negative cervical, supraclavicular or axillary adenopathy.  Respiratory: lungs were clear bilaterally without wheezing or crackles.  Cardiovascular:  Regular rate and rhythm, S1/S2, without murmur, rub or gallop.  There was no pedal edema.  GI:  abdomen was soft, flat, nontender, nondistended, without organomegaly.  Muscoloskeletal:  no spinal tenderness of palpation of vertebral spine.  Skin exam was without echymosis, petichae.  Neuro exam was nonfocal.  Patient was able to get on and off exam table without assistance.  Gait was normal.  Patient was alerted and oriented.  Attention was good.   Language was appropriate.  Mood was normal without depression.  Speech was not pressured.  Thought content was not tangential.    LABORATORY/RADIOLOGY DATA:  Lab Results  Component Value Date   WBC  7.0 09/30/2012   HGB 14.2 09/30/2012   HCT 42.1 09/30/2012   PLT 165 09/30/2012   GLUCOSE 193* 09/30/2012   ALKPHOS 44 09/30/2012   ALT 67* 09/30/2012   AST 46* 09/30/2012   NA 137 09/30/2012   K 4.1 09/30/2012   CL 104 09/30/2012   CREATININE 0.8 09/30/2012   BUN 19.6 09/30/2012   CO2 19* 09/30/2012   INR 0.92 01/25/2011   HGBA1C  Value: 8.8 (NOTE)                                                                       According to the ADA Clinical Practice Recommendations for 2011, when HbA1c is used as a screening test:   >=6.5%   Diagnostic of Diabetes Mellitus           (if abnormal result  is confirmed)  5.7-6.4%   Increased risk of developing Diabetes Mellitus  References:Diagnosis and Classification of Diabetes Mellitus,Diabetes Care,2011,34(Suppl 1):S62-S69 and Standards of Medical Care in         Diabetes - 2011,Diabetes Care,2011,34  (Suppl 1):S11-S61.* 01/25/2011   IMAGING:  *RADIOLOGY REPORT*  Clinical Data: Lung cancer follow-up. No specific chest  complaints.  CT CHEST WITH CONTRAST  Technique: Multidetector CT imaging of the chest was performed  following the standard protocol during bolus administration of  intravenous contrast.  Contrast: 80mL OMNIPAQUE IOHEXOL 300 MG/ML SOLN  Comparison: Chest CT 07/26/2011 and 03/25/2012. PET CT 11/03/2010.  Findings: There are no enlarged mediastinal or hilar lymph nodes.  A calcified subcarinal node on image 25 is stable. There is stable  right hilar fullness secondary to adjacent fibrosis.  Aortic and coronary artery atherosclerosis is again noted. The  thyroid gland appears normal. There is no pleural or pericardial  effusion.  Evolving radiation changes in the perihilar right lung are again  noted. No focal nodule or endobronchial lesion is identified. The  left lung is clear.  Images through the upper abdomen demonstrate progressive severe  hepatic steatosis. No focal lesion is evident. A tiny left  adrenal nodule is unchanged. There are no worrisome  osseous  findings. Bilateral gynecomastia is again noted.  IMPRESSION:  1. Evolving right lung radiation changes.  2. No evidence of local recurrence or metastatic disease.  3. Progressive hepatic steatosis.  Original Report Authenticated By: Carey Bullocks, M.D.   ASSESSMENT AND PLAN:   1. History of stage IIIB non-small cell lung cancer. There is no evidence of recurrence or metastatic disease on today clinical history, physical exam, lab, and CT scan. There was scar formation in the right middle lobe.  However, there was not convincing enough evidence for recurrent lung cancer. Recommend repeat CT scan in 6 months. Thereafter, plan to obtain CT scan annually as he will be 2 years out from his diagnosis. 2. Neuropathy. Resolved.  3. Bilateral shoulder pain and back  pain: Most likely consistent with OA since pain improves with activities. 4. Diabetes type 2. He is on sitagliptin/metformin and insulin per PCP.  He is having problem controlling his DM.  I advised him to go no low carb diet and lose weight.  Hopefully, his DM control will slightly improve.  5. Followup:  Return to clinic in about 6 months.    The length of time of the face-to-face encounter was 15 minutes. More than 50% of time was spent counseling and coordination of care.

## 2013-04-01 ENCOUNTER — Ambulatory Visit (HOSPITAL_COMMUNITY): Payer: Medicaid Other

## 2013-04-02 ENCOUNTER — Other Ambulatory Visit: Payer: Self-pay | Admitting: Oncology

## 2013-04-02 ENCOUNTER — Encounter (HOSPITAL_COMMUNITY): Payer: Self-pay

## 2013-04-02 ENCOUNTER — Ambulatory Visit (HOSPITAL_BASED_OUTPATIENT_CLINIC_OR_DEPARTMENT_OTHER): Payer: Medicare Other | Admitting: Lab

## 2013-04-02 ENCOUNTER — Ambulatory Visit (HOSPITAL_COMMUNITY)
Admission: RE | Admit: 2013-04-02 | Discharge: 2013-04-02 | Disposition: A | Discharge status: Home / Self Care | Payer: Medicare Other | Source: Ambulatory Visit | Attending: Oncology | Admitting: Oncology

## 2013-04-02 DIAGNOSIS — J438 Other emphysema: Secondary | ICD-10-CM | POA: Insufficient documentation

## 2013-04-02 DIAGNOSIS — C3490 Malignant neoplasm of unspecified part of unspecified bronchus or lung: Secondary | ICD-10-CM

## 2013-04-02 DIAGNOSIS — I2510 Atherosclerotic heart disease of native coronary artery without angina pectoris: Secondary | ICD-10-CM | POA: Insufficient documentation

## 2013-04-02 DIAGNOSIS — K7689 Other specified diseases of liver: Secondary | ICD-10-CM | POA: Insufficient documentation

## 2013-04-02 DIAGNOSIS — Z923 Personal history of irradiation: Secondary | ICD-10-CM | POA: Insufficient documentation

## 2013-04-02 DIAGNOSIS — Z9221 Personal history of antineoplastic chemotherapy: Secondary | ICD-10-CM | POA: Insufficient documentation

## 2013-04-02 LAB — CBC WITH DIFFERENTIAL/PLATELET
Basophils Absolute: 0.1 10*3/uL (ref 0.0–0.1)
Eosinophils Absolute: 0.4 10*3/uL (ref 0.0–0.5)
HCT: 47 % (ref 38.4–49.9)
LYMPH%: 37.4 % (ref 14.0–49.0)
MCHC: 33.5 g/dL (ref 32.0–36.0)
MONO#: 0.8 10*3/uL (ref 0.1–0.9)
NEUT#: 3.8 10*3/uL (ref 1.5–6.5)
NEUT%: 46.9 % (ref 39.0–75.0)
Platelets: 157 10*3/uL (ref 140–400)
WBC: 8.1 10*3/uL (ref 4.0–10.3)

## 2013-04-02 LAB — COMPREHENSIVE METABOLIC PANEL (CC13)
BUN: 19.9 mg/dL (ref 7.0–26.0)
CO2: 23 mEq/L (ref 22–29)
Creatinine: 1 mg/dL (ref 0.7–1.3)
Glucose: 249 mg/dl — ABNORMAL HIGH (ref 70–140)
Total Bilirubin: 0.68 mg/dL (ref 0.20–1.20)

## 2013-04-02 MED ORDER — IOHEXOL 300 MG/ML  SOLN
80.0000 mL | Freq: Once | INTRAMUSCULAR | Status: AC | PRN
  Administered 2013-04-02: 80 mL via INTRAVENOUS

## 2013-04-04 ENCOUNTER — Telehealth: Payer: Self-pay | Admitting: Hematology and Oncology

## 2013-04-04 ENCOUNTER — Ambulatory Visit (HOSPITAL_BASED_OUTPATIENT_CLINIC_OR_DEPARTMENT_OTHER): Payer: Medicare Other | Admitting: Hematology and Oncology

## 2013-04-04 VITALS — BP 123/77 | HR 87 | Temp 98.3°F | Resp 20 | Ht 66.0 in | Wt 183.8 lb

## 2013-04-04 DIAGNOSIS — K5900 Constipation, unspecified: Secondary | ICD-10-CM

## 2013-04-04 DIAGNOSIS — M2550 Pain in unspecified joint: Secondary | ICD-10-CM

## 2013-04-04 DIAGNOSIS — R0602 Shortness of breath: Secondary | ICD-10-CM

## 2013-04-04 DIAGNOSIS — Z85118 Personal history of other malignant neoplasm of bronchus and lung: Secondary | ICD-10-CM

## 2013-04-04 NOTE — Telephone Encounter (Signed)
gv and printed appt sched and avs foro pt °

## 2013-04-06 NOTE — Progress Notes (Signed)
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   PAST THERAPY: Concurrent definitive chemoradiation with weekly carboplatin/Taxol and daily radiation between 11/21/2010 and 01/02/2011. Status post consolidative chemotherapy with every 3 week carboplatin/Taxol x2 cycles which concluded on 03/18/2011.   CURRENT THERAPY: Watchful observation.   INTERVAL HISTORY:  George Griffin 52 y.o. male returns for regular follow up visit.  He has good appetite and his weight is stable. Occasionally he has headaches which is not new. He does not smoke cigarettes. Sometimes he has constupation once in 3-4 days. Patient reported dyspnea on exertion and occasionally cough but not recently. He occasionally has joint pain with weather change. His fatigue is less  He has numbness in feet when he had hyperglycemia. Patient denies fever, anorexia, weight loss, visual changes, confusion, drenching night sweats, palpable lymph node swelling, mucositis, odynophagia, dysphagia, nausea vomiting, jaundice, chest pain, palpitation, shortness of breath, productive cough, gum bleeding, epistaxis, hematemesis, hemoptysis, abdominal pain, abdominal swelling, early satiety, melena, hematochezia, hematuria, skin rash, spontaneous bleeding, joint swelling, heat or cold intolerance, bowel bladder incontinence, focal motor weakness,  depression, suicidal or homicidal ideation, feeling hopelessness.   Review of Systems  Constitutional: Negative for fever, chills, weight loss, malaise/fatigue and diaphoresis.  HENT: Negative for hearing loss, ear pain, nosebleeds, congestion, sore throat, neck pain, tinnitus and ear discharge.   Eyes: Negative for blurred vision, double vision, photophobia and pain.  Respiratory: Positive for shortness of breath. Negative for  cough, hemoptysis, sputum production and wheezing.   Cardiovascular: Negative for chest pain, palpitations, orthopnea, leg swelling and PND.  Gastrointestinal: Positive for constipation. Negative for heartburn, nausea, vomiting, abdominal pain, diarrhea, blood in stool and melena.  Genitourinary: Negative for dysuria, urgency, frequency, hematuria and flank pain.  Musculoskeletal: Positive for joint pain. Negative for myalgias and back pain.  Skin: Negative for itching and rash.  Neurological: Positive for sensory change and headaches. Negative for dizziness, tingling, tremors, seizures, loss of consciousness and weakness.  Endo/Heme/Allergies: Negative for polydipsia. Does not bruise/bleed easily.  Psychiatric/Behavioral: Negative.      PAST MEDICAL HISTORY: Past Medical History  Diagnosis Date  . Diabetes mellitus     metfomin  . Lung cancer     lung ca dx5/02/2011  . Hypertension   . ED (erectile dysfunction)   . Hyperlipidemia   . Pneumonia     hx  . Neuropathy of finger     mild s/p chemotherapy  . Hepatic steatosis 07/26/11    severe ct chest  . Constipation    HEALTH MAINTENANCE: History  Substance Use Topics  . Smoking status: Former Games developer  . Smokeless tobacco: Not on file  . Alcohol Use: No   No Known Allergies  Current Outpatient Prescriptions  Medication Sig Dispense Refill  . acetaminophen (TYLENOL) 325 MG tablet Take 650 mg by mouth every 6 (six) hours as needed.        . insulin aspart (NOVOLOG) 100 UNIT/ML injection Inject 15 Units into the skin 2 (two) times daily before a meal.      . metformin (FORTAMET) 1000 MG (OSM) 24 hr tablet Take 1 tablet by mouth Daily.      . sitaGLIPtan-metformin (JANUMET) 50-1000 MG per tablet Take 1 tablet by mouth 3 (three) times daily before meals.        No current  facility-administered medications for this visit.    OBJECTIVE: Filed Vitals:   04/04/13 1108  BP: 123/77  Pulse: 87  Temp: 98.3 F (36.8 C)  Resp: 20      Body mass index is 29.68 kg/(m^2).    ECOG FS: 0  HEENT: Sclerae anicteric.  Conjunctivae were pink. Pupils round and reactive bilaterally. Oral mucosa is moist without ulceration or thrush. No occipital, submandibular, cervical, supraclavicular or axillar adenopathy. Lungs: clear to auscultation without wheezes. No rales or rhonchi. Heart: regular rate and rhythm. No murmur, gallop or rubs. Abdomen: soft, non tender. No guarding or rebound tenderness. Bowel sounds are present. No palpable hepatosplenomegaly. MSK: no focal spinal tenderness. Extremities: No clubbing or cyanosis.No calf tenderness to palpitation, no peripheral edema. The patient had grossly intact strength in upper and lower extremities Neuro: non-focal, alert and oriented to time, person and place, appropriate affect  LAB RESULTS:  CMP     Component Value Date/Time   NA 138 04/02/2013 1130   NA 140 03/25/2012 0823   NA 137 11/27/2011 0922   K 4.4 04/02/2013 1130   K 4.9* 03/25/2012 0823   K 4.6 11/27/2011 0922   CL 104 09/30/2012 0908   CL 97* 03/25/2012 0823   CL 99 11/27/2011 0922   CO2 23 04/02/2013 1130   CO2 27 03/25/2012 0823   CO2 26 11/27/2011 0922   GLUCOSE 249* 04/02/2013 1130   GLUCOSE 193* 09/30/2012 0908   GLUCOSE 194* 03/25/2012 0823   GLUCOSE 300* 11/27/2011 0922   BUN 19.9 04/02/2013 1130   BUN 15 03/25/2012 0823   BUN 18 11/27/2011 0922   CREATININE 1.0 04/02/2013 1130   CREATININE 0.9 03/25/2012 0823   CREATININE 0.86 11/27/2011 0922   CALCIUM 9.8 04/02/2013 1130   CALCIUM 9.7 03/25/2012 0823   CALCIUM 9.9 11/27/2011 0922   PROT 8.1 04/02/2013 1130   PROT 8.6* 03/25/2012 0823   PROT 7.8 11/27/2011 0922   ALBUMIN 3.8 04/02/2013 1130   ALBUMIN 4.5 11/27/2011 0922   AST 50* 04/02/2013 1130   AST 33 03/25/2012 0823   AST 40* 11/27/2011 0922   ALT 66* 04/02/2013 1130   ALT 53* 03/25/2012 0823   ALT 63* 11/27/2011 0922   ALKPHOS 59 04/02/2013 1130   ALKPHOS 44 03/25/2012 0823   ALKPHOS 49 11/27/2011 0922   BILITOT 0.68 04/02/2013 1130   BILITOT  0.70 03/25/2012 0823   BILITOT 0.5 11/27/2011 0922   GFRNONAA >60 01/26/2011 0530   GFRAA  Value: >60        The eGFR has been calculated using the MDRD equation. This calculation has not been validated in all clinical situations. eGFR's persistently <60 mL/min signify possible Chronic Kidney Disease. 01/26/2011 0530    Lab Results  Component Value Date   WBC 8.1 04/02/2013   NEUTROABS 3.8 04/02/2013   HGB 15.7 04/02/2013   HCT 47.0 04/02/2013   MCV 89.1 04/02/2013   PLT 157 04/02/2013      Chemistry      Component Value Date/Time   NA 138 04/02/2013 1130   NA 140 03/25/2012 0823   NA 137 11/27/2011 0922   K 4.4 04/02/2013 1130   K 4.9* 03/25/2012 0823   K 4.6 11/27/2011 0922   CL 104 09/30/2012 0908   CL 97* 03/25/2012 0823   CL 99 11/27/2011 0922   CO2 23 04/02/2013 1130   CO2 27 03/25/2012 0823   CO2 26 11/27/2011 0922   BUN 19.9 04/02/2013 1130   BUN  15 03/25/2012 0823   BUN 18 11/27/2011 0922   CREATININE 1.0 04/02/2013 1130   CREATININE 0.9 03/25/2012 0823   CREATININE 0.86 11/27/2011 0922      Component Value Date/Time   CALCIUM 9.8 04/02/2013 1130   CALCIUM 9.7 03/25/2012 0823   CALCIUM 9.9 11/27/2011 0922   ALKPHOS 59 04/02/2013 1130   ALKPHOS 44 03/25/2012 0823   ALKPHOS 49 11/27/2011 0922   AST 50* 04/02/2013 1130   AST 33 03/25/2012 0823   AST 40* 11/27/2011 0922   ALT 66* 04/02/2013 1130   ALT 53* 03/25/2012 0823   ALT 63* 11/27/2011 0922   BILITOT 0.68 04/02/2013 1130   BILITOT 0.70 03/25/2012 0823   BILITOT 0.5 11/27/2011 0922     STUDIES: Ct Chest W Contrast  04/02/2013   *RADIOLOGY REPORT*  Clinical Data: Lung cancer with previous chemotherapy and radiation therapy.  CT CHEST WITH CONTRAST  Technique:  Multidetector CT imaging of the chest was performed following the standard protocol during bolus administration of intravenous contrast.  Contrast: 80mL OMNIPAQUE IOHEXOL 300 MG/ML  SOLN  Comparison: 09/30/2012.  Findings: No pathologically enlarged mediastinal, left hilar or axillary lymph nodes.  Calcified  subcarinal lymph nodes are noted. Confluent soft tissue in the right hilum is again seen and, when measured at the level the bronchus intermedius, appears stable at 2.0 cm (previously 1.9 cm).  Atherosclerotic calcification of the arterial vasculature, including coronary arteries.  Heart size normal.  No pericardial effusion.  Incidental note is made of bilateral gynecomastia.  Mild emphysema.  Changes of radiation therapy, including volume loss, bronchiectasis and architectural distortion are seen in the medial aspect of the right hemithorax, centered at the right hilum. No pleural fluid.  Airway is otherwise unremarkable.  Incidental imaging of the upper abdomen shows low attenuation throughout the visualized portion of the liver.  Slight nodularity of the left adrenal gland is unchanged.  No worrisome lytic or sclerotic lesions.  IMPRESSION:  1.  Stable changes of radiation therapy in the medial right hemithorax, centered at the right hilum, without evidence of recurrent or metastatic disease. 2.  Coronary artery calcification. 3.  Fatty liver. 4.  Slight nodularity of the left adrenal gland, stable.   Original Report Authenticated By: Leanna Battles, M.D.    ASSESSMENT AND PLAN:  1. History of stage IIIB non-small cell lung cancer. There is no evidence of recurrence or metastatic disease on today clinical history, physical exam, lab and follow up CT chest 04/02/2013  I will order chest CT prior next visit in 6 months. After that it can be done once a year. 2. History of neuropathy. Resolved.  3. Joint pain and back pain: Most likely consistent with OA. 4. Constipation. Recommended to use Murelax. If it will be not enough milk of magnesium can be tried. 5. Diabetes type 2. Follow up with PCP..  6. Followup: Return to clinic in about 6 months.  The length of time of the face-to-face encounter was 15  minutes. More than 50% of time was spent counseling and coordination of care.  Cc: Ralene Ok, MD     Myra Rude, MD   04/06/2013 7:03 PM

## 2013-09-22 ENCOUNTER — Other Ambulatory Visit: Payer: Self-pay | Admitting: Hematology and Oncology

## 2013-09-22 DIAGNOSIS — C3490 Malignant neoplasm of unspecified part of unspecified bronchus or lung: Secondary | ICD-10-CM

## 2013-09-26 ENCOUNTER — Telehealth: Payer: Self-pay | Admitting: *Deleted

## 2013-09-26 NOTE — Telephone Encounter (Signed)
sw pt informed the pt that NIG would like for him to come in later on 10/06/13. gv appt for 10/06/13@ 1:30p. Pt is aware...td

## 2013-10-02 ENCOUNTER — Other Ambulatory Visit (HOSPITAL_BASED_OUTPATIENT_CLINIC_OR_DEPARTMENT_OTHER): Payer: Medicare Other

## 2013-10-02 ENCOUNTER — Ambulatory Visit (HOSPITAL_COMMUNITY)
Admission: RE | Admit: 2013-10-02 | Discharge: 2013-10-02 | Disposition: A | Discharge status: Home / Self Care | Payer: Medicare Other | Source: Ambulatory Visit | Attending: Hematology and Oncology | Admitting: Hematology and Oncology

## 2013-10-02 ENCOUNTER — Encounter (HOSPITAL_COMMUNITY): Payer: Self-pay

## 2013-10-02 DIAGNOSIS — J701 Chronic and other pulmonary manifestations due to radiation: Secondary | ICD-10-CM | POA: Insufficient documentation

## 2013-10-02 DIAGNOSIS — E278 Other specified disorders of adrenal gland: Secondary | ICD-10-CM | POA: Insufficient documentation

## 2013-10-02 DIAGNOSIS — C3490 Malignant neoplasm of unspecified part of unspecified bronchus or lung: Secondary | ICD-10-CM

## 2013-10-02 DIAGNOSIS — Y842 Radiological procedure and radiotherapy as the cause of abnormal reaction of the patient, or of later complication, without mention of misadventure at the time of the procedure: Secondary | ICD-10-CM | POA: Insufficient documentation

## 2013-10-02 DIAGNOSIS — E785 Hyperlipidemia, unspecified: Secondary | ICD-10-CM | POA: Insufficient documentation

## 2013-10-02 DIAGNOSIS — I700 Atherosclerosis of aorta: Secondary | ICD-10-CM | POA: Insufficient documentation

## 2013-10-02 DIAGNOSIS — I2510 Atherosclerotic heart disease of native coronary artery without angina pectoris: Secondary | ICD-10-CM | POA: Insufficient documentation

## 2013-10-02 DIAGNOSIS — K7689 Other specified diseases of liver: Secondary | ICD-10-CM | POA: Insufficient documentation

## 2013-10-02 DIAGNOSIS — J438 Other emphysema: Secondary | ICD-10-CM | POA: Insufficient documentation

## 2013-10-02 DIAGNOSIS — Z85118 Personal history of other malignant neoplasm of bronchus and lung: Secondary | ICD-10-CM

## 2013-10-02 DIAGNOSIS — Z9221 Personal history of antineoplastic chemotherapy: Secondary | ICD-10-CM | POA: Insufficient documentation

## 2013-10-02 DIAGNOSIS — E119 Type 2 diabetes mellitus without complications: Secondary | ICD-10-CM | POA: Insufficient documentation

## 2013-10-02 LAB — CBC WITH DIFFERENTIAL/PLATELET
BASO%: 1.1 % (ref 0.0–2.0)
BASOS ABS: 0.1 10*3/uL (ref 0.0–0.1)
EOS ABS: 0.5 10*3/uL (ref 0.0–0.5)
EOS%: 6.5 % (ref 0.0–7.0)
HCT: 47.7 % (ref 38.4–49.9)
HEMOGLOBIN: 15.9 g/dL (ref 13.0–17.1)
LYMPH%: 35.9 % (ref 14.0–49.0)
MCH: 29.9 pg (ref 27.2–33.4)
MCHC: 33.3 g/dL (ref 32.0–36.0)
MCV: 89.7 fL (ref 79.3–98.0)
MONO#: 0.6 10*3/uL (ref 0.1–0.9)
MONO%: 8.6 % (ref 0.0–14.0)
NEUT%: 47.9 % (ref 39.0–75.0)
NEUTROS ABS: 3.6 10*3/uL (ref 1.5–6.5)
PLATELETS: 165 10*3/uL (ref 140–400)
RBC: 5.32 10*6/uL (ref 4.20–5.82)
RDW: 14.2 % (ref 11.0–14.6)
WBC: 7.5 10*3/uL (ref 4.0–10.3)
lymph#: 2.7 10*3/uL (ref 0.9–3.3)

## 2013-10-02 LAB — COMPREHENSIVE METABOLIC PANEL (CC13)
ALT: 34 U/L (ref 0–55)
ANION GAP: 13 meq/L — AB (ref 3–11)
AST: 26 U/L (ref 5–34)
Albumin: 4.2 g/dL (ref 3.5–5.0)
Alkaline Phosphatase: 51 U/L (ref 40–150)
BUN: 19.7 mg/dL (ref 7.0–26.0)
CALCIUM: 9.5 mg/dL (ref 8.4–10.4)
CHLORIDE: 107 meq/L (ref 98–109)
CO2: 23 meq/L (ref 22–29)
CREATININE: 0.9 mg/dL (ref 0.7–1.3)
GLUCOSE: 242 mg/dL — AB (ref 70–140)
Potassium: 4.4 mEq/L (ref 3.5–5.1)
Sodium: 143 mEq/L (ref 136–145)
Total Bilirubin: 0.58 mg/dL (ref 0.20–1.20)
Total Protein: 7.8 g/dL (ref 6.4–8.3)

## 2013-10-02 MED ORDER — IOHEXOL 300 MG/ML  SOLN
80.0000 mL | Freq: Once | INTRAMUSCULAR | Status: AC | PRN
  Administered 2013-10-02: 80 mL via INTRAVENOUS

## 2013-10-06 ENCOUNTER — Encounter: Payer: Self-pay | Admitting: Hematology and Oncology

## 2013-10-06 ENCOUNTER — Telehealth: Payer: Self-pay | Admitting: Hematology and Oncology

## 2013-10-06 ENCOUNTER — Ambulatory Visit (HOSPITAL_BASED_OUTPATIENT_CLINIC_OR_DEPARTMENT_OTHER): Payer: Medicare Other | Admitting: Hematology and Oncology

## 2013-10-06 VITALS — BP 124/65 | HR 99 | Temp 97.6°F | Resp 18 | Ht 66.0 in | Wt 177.3 lb

## 2013-10-06 DIAGNOSIS — K5900 Constipation, unspecified: Secondary | ICD-10-CM

## 2013-10-06 DIAGNOSIS — C3490 Malignant neoplasm of unspecified part of unspecified bronchus or lung: Secondary | ICD-10-CM

## 2013-10-06 DIAGNOSIS — C3400 Malignant neoplasm of unspecified main bronchus: Secondary | ICD-10-CM

## 2013-10-06 DIAGNOSIS — E119 Type 2 diabetes mellitus without complications: Secondary | ICD-10-CM

## 2013-10-06 NOTE — Telephone Encounter (Signed)
Gave pt appt for lab and md for August 2015

## 2013-10-06 NOTE — Progress Notes (Signed)
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   Primary site: Lung (Right)   Staging method: AJCC 7th Edition   Clinical: Stage IIIA (T3, N2, M0) signed by Heath Lark, MD on 10/06/2013  1:17 PM   Summary: Stage IIIA (T3, N2, M0)      Lung cancer   10/05/2010 Imaging CT scan shows significant right lung mass with subcarinal lymphadenopathy as well as postobstructive pneumonitis   10/31/2010 Procedure Bronchoscopy and biopsy confirmed squamous cell carcinoma involvement of the right mainstem   11/21/2010 - 01/02/2011 Chemotherapy The patient underwent concurrent chemotherapy with radiation therapy with weekly carboplatin and Taxol   01/23/2011 - 03/13/2011 Chemotherapy The patient underwent 2 more cycles of consolidation chemotherapy with carboplatin and Taxol    INTERVAL HISTORY: George Griffin 53 y.o. male returns for further followup. George Griffin does not have any recent cough or hemoptysis The patient denies any recent smoking. George Griffin has chronic constipation but this is not new.  I have reviewed the past medical history, past surgical history, social history and family history with the patient and they are unchanged from previous note.  ALLERGIES:  has No Known Allergies.  MEDICATIONS:  Current Outpatient Prescriptions  Medication Sig Dispense Refill  . acetaminophen (TYLENOL) 325 MG tablet Take 650 mg by mouth every 6 (six) hours as needed.        . insulin aspart (NOVOLOG) 100 UNIT/ML injection Inject 15 Units into the skin 2 (two) times daily before a meal.      . metformin (FORTAMET) 1000 MG (OSM) 24 hr tablet Take 1 tablet by mouth Daily.      . sitaGLIPtan-metformin (JANUMET) 50-1000 MG per tablet Take 1  tablet by mouth 3 (three) times daily before meals.        No current facility-administered medications for this visit.    REVIEW OF SYSTEMS:   Constitutional: Denies fevers, chills or abnormal weight loss Eyes: Denies blurriness of vision Ears, nose, mouth, throat, and face: Denies mucositis or sore throat Respiratory: Denies cough, dyspnea or wheezes Cardiovascular: Denies palpitation, chest discomfort or lower extremity swelling Gastrointestinal:  Denies nausea, heartburn or change in bowel habits Skin: Denies abnormal skin rashes Lymphatics: Denies new lymphadenopathy or easy bruising Neurological:Denies numbness, tingling or new weaknesses Behavioral/Psych: Mood is stable, no new changes  All other systems were reviewed with the patient and are negative.  PHYSICAL EXAMINATION: ECOG PERFORMANCE STATUS: 0 - Asymptomatic  Filed Vitals:   10/06/13 1252  BP: 124/65  Pulse: 99  Temp: 97.6 F (36.4 C)  Resp: 18   Filed Weights   10/06/13 1252  Weight: 177 lb 4.8 oz (80.423 kg)    GENERAL:alert, no distress and comfortable SKIN: skin color, texture, turgor are normal, no rashes or significant lesions EYES: normal, Conjunctiva are pink and non-injected, sclera clear OROPHARYNX:no exudate, no erythema and lips, buccal mucosa, and tongue normal  NECK: supple, thyroid normal size, non-tender, without nodularity LYMPH:  no palpable lymphadenopathy in the cervical, axillary or inguinal LUNGS: clear to auscultation and percussion with normal breathing effort HEART: regular rate & rhythm and no murmurs and no lower extremity edema ABDOMEN:abdomen soft, non-tender and normal bowel sounds Musculoskeletal:no cyanosis of digits and no clubbing  NEURO: alert &  oriented x 3 with fluent speech, no focal motor/sensory deficits  LABORATORY DATA:  I have reviewed the data as listed    Component Value Date/Time   NA 143 10/02/2013 0935   NA 140 03/25/2012 0823   NA 137 11/27/2011 0922   K  4.4 10/02/2013 0935   K 4.9* 03/25/2012 0823   K 4.6 11/27/2011 0922   CL 104 09/30/2012 0908   CL 97* 03/25/2012 0823   CL 99 11/27/2011 0922   CO2 23 10/02/2013 0935   CO2 27 03/25/2012 0823   CO2 26 11/27/2011 0922   GLUCOSE 242* 10/02/2013 0935   GLUCOSE 193* 09/30/2012 0908   GLUCOSE 194* 03/25/2012 0823   GLUCOSE 300* 11/27/2011 0922   BUN 19.7 10/02/2013 0935   BUN 15 03/25/2012 0823   BUN 18 11/27/2011 0922   CREATININE 0.9 10/02/2013 0935   CREATININE 0.9 03/25/2012 0823   CREATININE 0.86 11/27/2011 0922   CALCIUM 9.5 10/02/2013 0935   CALCIUM 9.7 03/25/2012 0823   CALCIUM 9.9 11/27/2011 0922   PROT 7.8 10/02/2013 0935   PROT 8.6* 03/25/2012 0823   PROT 7.8 11/27/2011 0922   ALBUMIN 4.2 10/02/2013 0935   ALBUMIN 4.5 11/27/2011 0922   AST 26 10/02/2013 0935   AST 33 03/25/2012 0823   AST 40* 11/27/2011 0922   ALT 34 10/02/2013 0935   ALT 53* 03/25/2012 0823   ALT 63* 11/27/2011 0922   ALKPHOS 51 10/02/2013 0935   ALKPHOS 44 03/25/2012 0823   ALKPHOS 49 11/27/2011 0922   BILITOT 0.58 10/02/2013 0935   BILITOT 0.70 03/25/2012 0823   BILITOT 0.5 11/27/2011 0922   GFRNONAA >60 01/26/2011 0530   GFRAA  Value: >60        The eGFR has been calculated using the MDRD equation. This calculation has not been validated in all clinical situations. eGFR's persistently <60 mL/min signify possible Chronic Kidney Disease. 01/26/2011 0530    No results found for this basename: SPEP, UPEP,  kappa and lambda light chains    Lab Results  Component Value Date   WBC 7.5 10/02/2013   NEUTROABS 3.6 10/02/2013   HGB 15.9 10/02/2013   HCT 47.7 10/02/2013   MCV 89.7 10/02/2013   PLT 165 10/02/2013      Chemistry      Component Value Date/Time   NA 143 10/02/2013 0935   NA 140 03/25/2012 0823   NA 137 11/27/2011 0922   K 4.4 10/02/2013 0935   K 4.9* 03/25/2012 0823   K 4.6 11/27/2011 0922   CL 104 09/30/2012 0908   CL 97* 03/25/2012 0823   CL 99 11/27/2011 0922   CO2 23 10/02/2013 0935   CO2 27 03/25/2012 0823   CO2 26 11/27/2011 0922   BUN 19.7 10/02/2013 0935    BUN 15 03/25/2012 0823   BUN 18 11/27/2011 0922   CREATININE 0.9 10/02/2013 0935   CREATININE 0.9 03/25/2012 0823   CREATININE 0.86 11/27/2011 0922      Component Value Date/Time   CALCIUM 9.5 10/02/2013 0935   CALCIUM 9.7 03/25/2012 0823   CALCIUM 9.9 11/27/2011 0922   ALKPHOS 51 10/02/2013 0935   ALKPHOS 44 03/25/2012 0823   ALKPHOS 49 11/27/2011 0922   AST 26 10/02/2013 0935   AST 33 03/25/2012 0823   AST 40* 11/27/2011 0922   ALT 34 10/02/2013 0935   ALT 53* 03/25/2012 0823   ALT 63* 11/27/2011 0922   BILITOT 0.58 10/02/2013 0935   BILITOT 0.70 03/25/2012 7078  BILITOT 0.5 11/27/2011 3354       RADIOGRAPHIC STUDIES: A review CT scan dated 10/02/2013 we show no evidence of disease recurrence I have personally reviewed the radiological images as listed and agreed with the findings in the report.  ASSESSMENT & PLAN:  #1 stage III lung cancer, no evidence of active disease The patient is almost 3 years out from prior treatment. I will still see him in 6 months but I will plan to alternate CT scan with chest x-ray. When I see him in 6 months, I will only order a chest x-ray #2 chronic constipation George Griffin will continue laxatives as needed #3 diabetes George Griffin will continue followup with his primary care provider #4 preventive care The patient has received influenza vaccination recently  Orders Placed This Encounter  Procedures  . DG Chest 2 View    Standing Status: Future     Number of Occurrences:      Standing Expiration Date: 10/06/2014    Order Specific Question:  Reason for exam:    Answer:  hx lung cancer, r/o recurrence    Order Specific Question:  Preferred imaging location?    Answer:  [facility]  . CBC with Differential    Standing Status: Future     Number of Occurrences:      Standing Expiration Date: 10/06/2014  . Comprehensive metabolic panel    Standing Status: Future     Number of Occurrences:      Standing Expiration Date: 10/06/2014   All questions were answered. The patient  knows to call the clinic with any problems, questions or concerns. No barriers to learning was detected. I spent 25 minutes counseling the patient face to face. The total time spent in the appointment was 40 minutes and more than 50% was on counseling and review of test results     [facility], [place], MD 10/06/2013 1:27 PM

## 2014-04-03 ENCOUNTER — Ambulatory Visit (HOSPITAL_COMMUNITY)
Admission: RE | Admit: 2014-04-03 | Discharge: 2014-04-03 | Disposition: A | Discharge status: Home / Self Care | Payer: Medicare Other | Source: Ambulatory Visit | Attending: Hematology and Oncology | Admitting: Hematology and Oncology

## 2014-04-03 DIAGNOSIS — I10 Essential (primary) hypertension: Secondary | ICD-10-CM | POA: Diagnosis not present

## 2014-04-03 DIAGNOSIS — T66XXXA Radiation sickness, unspecified, initial encounter: Secondary | ICD-10-CM | POA: Insufficient documentation

## 2014-04-03 DIAGNOSIS — M479 Spondylosis, unspecified: Secondary | ICD-10-CM | POA: Diagnosis not present

## 2014-04-03 DIAGNOSIS — Y842 Radiological procedure and radiotherapy as the cause of abnormal reaction of the patient, or of later complication, without mention of misadventure at the time of the procedure: Secondary | ICD-10-CM | POA: Diagnosis not present

## 2014-04-03 DIAGNOSIS — Z85118 Personal history of other malignant neoplasm of bronchus and lung: Secondary | ICD-10-CM | POA: Diagnosis not present

## 2014-04-03 DIAGNOSIS — C3490 Malignant neoplasm of unspecified part of unspecified bronchus or lung: Secondary | ICD-10-CM

## 2014-04-06 ENCOUNTER — Other Ambulatory Visit (HOSPITAL_BASED_OUTPATIENT_CLINIC_OR_DEPARTMENT_OTHER): Payer: Medicare Other

## 2014-04-06 ENCOUNTER — Encounter: Payer: Self-pay | Admitting: Hematology and Oncology

## 2014-04-06 ENCOUNTER — Ambulatory Visit (HOSPITAL_BASED_OUTPATIENT_CLINIC_OR_DEPARTMENT_OTHER): Payer: Medicare Other | Admitting: Hematology and Oncology

## 2014-04-06 ENCOUNTER — Encounter: Payer: Self-pay | Admitting: *Deleted

## 2014-04-06 ENCOUNTER — Ambulatory Visit (HOSPITAL_COMMUNITY): Payer: Medicare Other

## 2014-04-06 VITALS — BP 138/77 | HR 82 | Temp 97.1°F | Resp 18 | Ht 66.0 in | Wt 173.1 lb

## 2014-04-06 DIAGNOSIS — C3490 Malignant neoplasm of unspecified part of unspecified bronchus or lung: Secondary | ICD-10-CM

## 2014-04-06 DIAGNOSIS — Z85118 Personal history of other malignant neoplasm of bronchus and lung: Secondary | ICD-10-CM

## 2014-04-06 DIAGNOSIS — R0600 Dyspnea, unspecified: Secondary | ICD-10-CM

## 2014-04-06 DIAGNOSIS — R0989 Other specified symptoms and signs involving the circulatory and respiratory systems: Secondary | ICD-10-CM

## 2014-04-06 DIAGNOSIS — R0609 Other forms of dyspnea: Secondary | ICD-10-CM

## 2014-04-06 DIAGNOSIS — Z5900 Homelessness unspecified: Secondary | ICD-10-CM

## 2014-04-06 LAB — CBC WITH DIFFERENTIAL/PLATELET
BASO%: 0.8 % (ref 0.0–2.0)
Basophils Absolute: 0.1 10*3/uL (ref 0.0–0.1)
EOS%: 10.3 % — AB (ref 0.0–7.0)
Eosinophils Absolute: 0.8 10*3/uL — ABNORMAL HIGH (ref 0.0–0.5)
HEMATOCRIT: 47.1 % (ref 38.4–49.9)
HGB: 15.3 g/dL (ref 13.0–17.1)
LYMPH#: 2.7 10*3/uL (ref 0.9–3.3)
LYMPH%: 33.1 % (ref 14.0–49.0)
MCH: 29.1 pg (ref 27.2–33.4)
MCHC: 32.4 g/dL (ref 32.0–36.0)
MCV: 89.6 fL (ref 79.3–98.0)
MONO#: 0.8 10*3/uL (ref 0.1–0.9)
MONO%: 9.6 % (ref 0.0–14.0)
NEUT#: 3.8 10*3/uL (ref 1.5–6.5)
NEUT%: 46.2 % (ref 39.0–75.0)
Platelets: 175 10*3/uL (ref 140–400)
RBC: 5.26 10*6/uL (ref 4.20–5.82)
RDW: 13.8 % (ref 11.0–14.6)
WBC: 8.1 10*3/uL (ref 4.0–10.3)

## 2014-04-06 LAB — COMPREHENSIVE METABOLIC PANEL
ALK PHOS: 46 U/L (ref 39–117)
ALT: 24 U/L (ref 0–53)
AST: 21 U/L (ref 0–37)
Albumin: 4.4 g/dL (ref 3.5–5.2)
BILIRUBIN TOTAL: 0.3 mg/dL (ref 0.2–1.2)
BUN: 19 mg/dL (ref 6–23)
CO2: 22 mEq/L (ref 19–32)
CREATININE: 0.85 mg/dL (ref 0.50–1.35)
Calcium: 9.8 mg/dL (ref 8.4–10.5)
Chloride: 103 mEq/L (ref 96–112)
Glucose, Bld: 192 mg/dL — ABNORMAL HIGH (ref 70–99)
Potassium: 4.3 mEq/L (ref 3.5–5.3)
Sodium: 137 mEq/L (ref 135–145)
Total Protein: 7.4 g/dL (ref 6.0–8.3)

## 2014-04-06 NOTE — Progress Notes (Signed)
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   Primary site: Lung (Right)   Staging method: AJCC 7th Edition   Clinical: Stage IIIA (T3, N2, M0) signed by Heath Lark, MD on 10/06/2013  1:17 PM   Summary: Stage IIIA (T3, N2, M0)      Lung cancer   10/05/2010 Imaging CT scan shows significant right lung mass with subcarinal lymphadenopathy as well as postobstructive pneumonitis   10/31/2010 Procedure Bronchoscopy and biopsy confirmed squamous cell carcinoma involvement of the right mainstem   11/21/2010 - 01/02/2011 Chemotherapy The patient underwent concurrent chemotherapy with radiation therapy with weekly carboplatin and Taxol   01/23/2011 - 03/13/2011 Chemotherapy The patient underwent 2 more cycles of consolidation chemotherapy with carboplatin and Taxol    INTERVAL HISTORY: Please see below for problem oriented charting. He complained of shortness of breath on minimal exertion. Denies recent smoking. Denies recent hemoptysis. The patient currently has no family member close by. He is homeless and speaks minimal [place].  REVIEW OF SYSTEMS:   Constitutional: Denies fevers, chills or abnormal weight loss Eyes: Denies blurriness of vision Ears, nose, mouth, throat, and face: Denies mucositis or sore throat Cardiovascular: Denies palpitation, chest discomfort or lower extremity swelling Gastrointestinal:  Denies nausea, heartburn or change in bowel habits Skin: Denies abnormal skin rashes Lymphatics: Denies new lymphadenopathy or easy bruising Neurological:Denies numbness, tingling or new weaknesses Behavioral/Psych: Mood is stable, no new changes  All other systems were reviewed with the patient and are negative.  I have reviewed the past medical history, past  surgical history, social history and family history with the patient and they are unchanged from previous note.  ALLERGIES:  has No Known Allergies.  MEDICATIONS:  Current Outpatient Prescriptions  Medication Sig Dispense Refill  . acetaminophen (TYLENOL) 325 MG tablet Take 650 mg by mouth every 6 (six) hours as needed.        . insulin aspart (NOVOLOG) 100 UNIT/ML injection Inject 15 Units into the skin 2 (two) times daily before a meal.      . metformin (FORTAMET) 1000 MG (OSM) 24 hr tablet Take 1 tablet by mouth Daily.      . sitaGLIPtan-metformin (JANUMET) 50-1000 MG per tablet Take 1 tablet by mouth 3 (three) times daily before meals.        No current facility-administered medications for this visit.    PHYSICAL EXAMINATION: ECOG PERFORMANCE STATUS: 1 - Symptomatic but completely ambulatory  Filed Vitals:   04/06/14 0909  BP: 138/77  Pulse: 82  Temp: 97.1 F (36.2 C)  Resp: 18   Filed Weights   04/06/14 0909  Weight: 173 lb 1.6 oz (78.518 kg)    GENERAL:alert, no distress and comfortable SKIN: skin color, texture, turgor are normal, no rashes or significant lesions EYES: normal, Conjunctiva are pink and non-injected, sclera clear OROPHARYNX:no exudate, no erythema and lips, buccal mucosa, and tongue normal  NECK: supple, thyroid normal size, non-tender, without nodularity LYMPH:  no palpable lymphadenopathy in the cervical, axillary or inguinal LUNGS: clear to auscultation and percussion with normal breathing effort HEART: regular rate & rhythm and no murmurs and no lower extremity edema ABDOMEN:abdomen soft, non-tender and normal bowel sounds Musculoskeletal:no cyanosis of digits and no clubbing  NEURO: alert & oriented x 3 with fluent speech, no focal motor/sensory deficits  LABORATORY DATA:  I have reviewed the data as listed    Component Value Date/Time   NA 143 10/02/2013 0935   NA 140 03/25/2012 0823   NA 137 11/27/2011 0922   K 4.4 10/02/2013 0935   K 4.9*  03/25/2012 0823   K 4.6 11/27/2011 0922   CL 104 09/30/2012 0908   CL 97* 03/25/2012 0823   CL 99 11/27/2011 0922   CO2 23 10/02/2013 0935   CO2 27 03/25/2012 0823   CO2 26 11/27/2011 0922   GLUCOSE 242* 10/02/2013 0935   GLUCOSE 193* 09/30/2012 0908   GLUCOSE 194* 03/25/2012 0823   GLUCOSE 300* 11/27/2011 0922   BUN 19.7 10/02/2013 0935   BUN 15 03/25/2012 0823   BUN 18 11/27/2011 0922   CREATININE 0.9 10/02/2013 0935   CREATININE 0.9 03/25/2012 0823   CREATININE 0.86 11/27/2011 0922   CALCIUM 9.5 10/02/2013 0935   CALCIUM 9.7 03/25/2012 0823   CALCIUM 9.9 11/27/2011 0922   PROT 7.8 10/02/2013 0935   PROT 8.6* 03/25/2012 0823   PROT 7.8 11/27/2011 0922   ALBUMIN 4.2 10/02/2013 0935   ALBUMIN 4.5 11/27/2011 0922   AST 26 10/02/2013 0935   AST 33 03/25/2012 0823   AST 40* 11/27/2011 0922   ALT 34 10/02/2013 0935   ALT 53* 03/25/2012 0823   ALT 63* 11/27/2011 0922   ALKPHOS 51 10/02/2013 0935   ALKPHOS 44 03/25/2012 0823   ALKPHOS 49 11/27/2011 0922   BILITOT 0.58 10/02/2013 0935   BILITOT 0.70 03/25/2012 0823   BILITOT 0.5 11/27/2011 0922   GFRNONAA >60 01/26/2011 0530   GFRAA  Value: >60        The eGFR has been calculated using the MDRD equation. This calculation has not been validated in all clinical situations. eGFR's persistently <60 mL/min signify possible Chronic Kidney Disease. 01/26/2011 0530    No results found for this basename: SPEP, UPEP,  kappa and lambda light chains    Lab Results  Component Value Date   WBC 8.1 04/06/2014   NEUTROABS 3.8 04/06/2014   HGB 15.3 04/06/2014   HCT 47.1 04/06/2014   MCV 89.6 04/06/2014   PLT 175 04/06/2014      Chemistry      Component Value Date/Time   NA 143 10/02/2013 0935   NA 140 03/25/2012 0823   NA 137 11/27/2011 0922   K 4.4 10/02/2013 0935   K 4.9* 03/25/2012 0823   K 4.6 11/27/2011 0922   CL 104 09/30/2012 0908   CL 97* 03/25/2012 0823   CL 99 11/27/2011 0922   CO2 23 10/02/2013 0935   CO2 27 03/25/2012 0823   CO2 26 11/27/2011 0922   BUN 19.7 10/02/2013 0935   BUN 15 03/25/2012 0823    BUN 18 11/27/2011 0922   CREATININE 0.9 10/02/2013 0935   CREATININE 0.9 03/25/2012 0823   CREATININE 0.86 11/27/2011 0922      Component Value Date/Time   CALCIUM 9.5 10/02/2013 0935   CALCIUM 9.7 03/25/2012 0823   CALCIUM 9.9 11/27/2011 0922   ALKPHOS 51 10/02/2013 0935   ALKPHOS 44 03/25/2012 0823   ALKPHOS 49 11/27/2011 0922   AST 26 10/02/2013 0935   AST 33 03/25/2012 0823   AST 40* 11/27/2011 0922   ALT 34 10/02/2013 0935   ALT 53* 03/25/2012 0823   ALT 63* 11/27/2011 0922   BILITOT 0.58 10/02/2013 0935   BILITOT 0.70 03/25/2012 0823   BILITOT 0.5 11/27/2011 2641  RADIOGRAPHIC STUDIES: I reviewed chest x-ray which show persistent consolidation in his chest I have personally reviewed the radiological images as listed and agreed with the findings in the report.  ASSESSMENT & PLAN:  Lung cancer Clinically, he has no evidence of recurrence. Chest x-ray show persistent scarring with no change compared to prior imaging. I will see him back again in 3 months with history, physical examination and noncontrast CT scan.  Homeless He has very poor social circumstances. I recommend Education officer, museum consultation.  Dyspnea He has permanent disability from prior radiation causing shortness of breath on minimal exertion. I fill out disability parking permit for the patient.   Orders Placed This Encounter  Procedures  . CT Chest Wo Contrast    Standing Status: Future     Number of Occurrences:      Standing Expiration Date: 06/06/2015    Order Specific Question:  Reason for Exam (SYMPTOM  OR DIAGNOSIS REQUIRED)    Answer:  staging lung ca    Order Specific Question:  Preferred imaging location?    Answer:  [facility]  . CBC with Differential    Standing Status: Future     Number of Occurrences:      Standing Expiration Date: 05/11/2015  . Comprehensive metabolic panel    Standing Status: Future     Number of Occurrences:      Standing Expiration Date: 05/11/2015  . Ambulatory referral to  Social Work    Referral Priority:  Routine    Referral Type:  Consultation    Referral Reason:  Specialty Services Required    Number of Visits Requested:  1   All questions were answered. The patient knows to call the clinic with any problems, questions or concerns. No barriers to learning was detected. I spent 15 minutes counseling the patient face to face. The total time spent in the appointment was 20 minutes and more than 50% was on counseling and review of test results     [facility], [place], MD 04/06/2014 9:34 AM

## 2014-04-06 NOTE — Assessment & Plan Note (Signed)
Clinically, he has no evidence of recurrence. Chest x-ray show persistent scarring with no change compared to prior imaging. I will see him back again in 3 months with history, physical examination and noncontrast CT scan.

## 2014-04-06 NOTE — Progress Notes (Signed)
[place] Work  Clinical Social Work was referred by Armed forces training and education officer for assessment of psychosocial needs.  Clinical Social Worker met with patient after medical oncologist appointment to offer support and assess for needs.  CSW assisted patient in completing     Clinical Social Work interventions:  Polo Riley, MSW, LCSW, OSW-C Clinical Social Worker [facility] [phone]

## 2014-04-06 NOTE — Assessment & Plan Note (Signed)
He has very poor social circumstances. I recommend Education officer, museum consultation.

## 2014-04-06 NOTE — Assessment & Plan Note (Signed)
He has permanent disability from prior radiation causing shortness of breath on minimal exertion. I fill out disability parking permit for the patient.

## 2014-07-06 ENCOUNTER — Ambulatory Visit (HOSPITAL_COMMUNITY)
Admission: RE | Admit: 2014-07-06 | Discharge: 2014-07-06 | Disposition: A | Discharge status: Home / Self Care | Payer: Medicaid Other | Source: Ambulatory Visit | Attending: Hematology and Oncology | Admitting: Hematology and Oncology

## 2014-07-06 ENCOUNTER — Other Ambulatory Visit (HOSPITAL_BASED_OUTPATIENT_CLINIC_OR_DEPARTMENT_OTHER): Payer: Medicaid Other

## 2014-07-06 ENCOUNTER — Encounter (HOSPITAL_COMMUNITY): Payer: Self-pay

## 2014-07-06 DIAGNOSIS — Z85118 Personal history of other malignant neoplasm of bronchus and lung: Secondary | ICD-10-CM | POA: Diagnosis present

## 2014-07-06 DIAGNOSIS — I2510 Atherosclerotic heart disease of native coronary artery without angina pectoris: Secondary | ICD-10-CM | POA: Diagnosis not present

## 2014-07-06 DIAGNOSIS — J439 Emphysema, unspecified: Secondary | ICD-10-CM | POA: Insufficient documentation

## 2014-07-06 DIAGNOSIS — C3490 Malignant neoplasm of unspecified part of unspecified bronchus or lung: Secondary | ICD-10-CM

## 2014-07-06 DIAGNOSIS — M549 Dorsalgia, unspecified: Secondary | ICD-10-CM | POA: Diagnosis present

## 2014-07-06 DIAGNOSIS — R05 Cough: Secondary | ICD-10-CM | POA: Diagnosis present

## 2014-07-06 LAB — CBC WITH DIFFERENTIAL/PLATELET
BASO%: 0.8 % (ref 0.0–2.0)
Basophils Absolute: 0.1 10*3/uL (ref 0.0–0.1)
EOS%: 6.8 % (ref 0.0–7.0)
Eosinophils Absolute: 0.5 10*3/uL (ref 0.0–0.5)
HCT: 46.8 % (ref 38.4–49.9)
HEMOGLOBIN: 15.2 g/dL (ref 13.0–17.1)
LYMPH%: 29.1 % (ref 14.0–49.0)
MCH: 28.9 pg (ref 27.2–33.4)
MCHC: 32.4 g/dL (ref 32.0–36.0)
MCV: 89 fL (ref 79.3–98.0)
MONO#: 0.7 10*3/uL (ref 0.1–0.9)
MONO%: 9.3 % (ref 0.0–14.0)
NEUT#: 4.2 10*3/uL (ref 1.5–6.5)
NEUT%: 54 % (ref 39.0–75.0)
PLATELETS: 168 10*3/uL (ref 140–400)
RBC: 5.27 10*6/uL (ref 4.20–5.82)
RDW: 14.1 % (ref 11.0–14.6)
WBC: 7.8 10*3/uL (ref 4.0–10.3)
lymph#: 2.3 10*3/uL (ref 0.9–3.3)

## 2014-07-06 LAB — COMPREHENSIVE METABOLIC PANEL (CC13)
ALT: 23 U/L (ref 0–55)
ANION GAP: 10 meq/L (ref 3–11)
AST: 18 U/L (ref 5–34)
Albumin: 4.3 g/dL (ref 3.5–5.0)
Alkaline Phosphatase: 45 U/L (ref 40–150)
BILIRUBIN TOTAL: 0.32 mg/dL (ref 0.20–1.20)
BUN: 29.7 mg/dL — ABNORMAL HIGH (ref 7.0–26.0)
CALCIUM: 9.9 mg/dL (ref 8.4–10.4)
CHLORIDE: 105 meq/L (ref 98–109)
CO2: 25 meq/L (ref 22–29)
CREATININE: 1.1 mg/dL (ref 0.7–1.3)
Glucose: 260 mg/dl — ABNORMAL HIGH (ref 70–140)
Potassium: 4.5 mEq/L (ref 3.5–5.1)
Sodium: 139 mEq/L (ref 136–145)
Total Protein: 7.9 g/dL (ref 6.4–8.3)

## 2014-07-13 ENCOUNTER — Ambulatory Visit (HOSPITAL_BASED_OUTPATIENT_CLINIC_OR_DEPARTMENT_OTHER): Payer: Medicare Other | Admitting: Hematology and Oncology

## 2014-07-13 ENCOUNTER — Encounter: Payer: Self-pay | Admitting: Hematology and Oncology

## 2014-07-13 ENCOUNTER — Encounter: Payer: Self-pay | Admitting: *Deleted

## 2014-07-13 ENCOUNTER — Telehealth: Payer: Self-pay | Admitting: Hematology and Oncology

## 2014-07-13 VITALS — BP 130/78 | HR 72 | Temp 97.6°F | Resp 17 | Ht 66.0 in | Wt 168.1 lb

## 2014-07-13 DIAGNOSIS — Z85118 Personal history of other malignant neoplasm of bronchus and lung: Secondary | ICD-10-CM | POA: Diagnosis not present

## 2014-07-13 DIAGNOSIS — E1140 Type 2 diabetes mellitus with diabetic neuropathy, unspecified: Secondary | ICD-10-CM

## 2014-07-13 DIAGNOSIS — C3402 Malignant neoplasm of left main bronchus: Secondary | ICD-10-CM

## 2014-07-13 DIAGNOSIS — C3400 Malignant neoplasm of unspecified main bronchus: Secondary | ICD-10-CM

## 2014-07-13 NOTE — Assessment & Plan Note (Signed)
This is due to diabetic neuropathy. I recommend close follow-up with PCP.

## 2014-07-13 NOTE — Progress Notes (Signed)
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   Primary site: Lung (Right)   Staging method: AJCC 7th Edition   Clinical: Stage IIIA (T3, N2, M0) signed by Heath Lark, MD on 10/06/2013  1:17 PM   Summary: Stage IIIA (T3, N2, M0)      Cancer of hilus of left lung   10/05/2010 Imaging CT scan shows significant right lung mass with subcarinal lymphadenopathy as well as postobstructive pneumonitis   10/31/2010 Procedure Bronchoscopy and biopsy confirmed squamous cell carcinoma involvement of the right mainstem   11/21/2010 - 01/02/2011 Chemotherapy The patient underwent concurrent chemotherapy with radiation therapy with weekly carboplatin and Taxol   01/23/2011 - 03/13/2011 Chemotherapy The patient underwent 2 more cycles of consolidation chemotherapy with carboplatin and Taxol   07/06/2014 Imaging serial imaging study of the lung showed no recurrence of cancer.    INTERVAL HISTORY: Please see below for problem oriented charting. He has chronic nonproductive cough. Denies hemoptysis. Denies recent smoking. He complained of mild peripheral neuropathy, unchanged compared to baseline.  REVIEW OF SYSTEMS:   Constitutional: Denies fevers, chills or abnormal weight loss Eyes: Denies blurriness of vision Ears, nose, mouth, throat, and face: Denies mucositis or sore throat Cardiovascular: Denies palpitation, chest discomfort or lower extremity swelling Gastrointestinal:  Denies nausea, heartburn or change in bowel habits Skin: Denies abnormal skin rashes Lymphatics: Denies new lymphadenopathy or easy bruising Neurological:Denies numbness, tingling or new weaknesses Behavioral/Psych: Mood is stable, no new changes  All other systems were reviewed with the patient and are negative.  I  have reviewed the past medical history, past surgical history, social history and family history with the patient and they are unchanged from previous note.  ALLERGIES:  has No Known Allergies.  MEDICATIONS:  Current Outpatient Prescriptions  Medication Sig Dispense Refill  . acetaminophen (TYLENOL) 325 MG tablet Take 650 mg by mouth every 6 (six) hours as needed.      . insulin aspart (NOVOLOG) 100 UNIT/ML injection Inject 15 Units into the skin 2 (two) times daily before a meal.    . metformin (FORTAMET) 1000 MG (OSM) 24 hr tablet Take 1 tablet by mouth Daily.    . sitaGLIPtan-metformin (JANUMET) 50-1000 MG per tablet Take 1 tablet by mouth 3 (three) times daily before meals.      No current facility-administered medications for this visit.    PHYSICAL EXAMINATION: ECOG PERFORMANCE STATUS: 1 - Symptomatic but completely ambulatory  Filed Vitals:   07/13/14 0819  BP: 130/78  Pulse: 72  Temp: 97.6 F (36.4 C)  Resp: 17   Filed Weights   07/13/14 0819  Weight: 168 lb 1.6 oz (76.25 kg)    GENERAL:alert, no distress and comfortable SKIN: skin color, texture, turgor are normal, no rashes or significant lesions EYES: normal, Conjunctiva are pink and non-injected, sclera clear OROPHARYNX:no exudate, no erythema and lips, buccal mucosa, and tongue normal  NECK: supple, thyroid normal size, non-tender, without nodularity LYMPH:  no palpable lymphadenopathy in the cervical, axillary or inguinal LUNGS: clear to auscultation and percussion with normal breathing effort HEART: regular rate & rhythm and no murmurs and no lower extremity edema ABDOMEN:abdomen soft, non-tender and normal bowel sounds Musculoskeletal:no cyanosis of digits and no clubbing  NEURO: alert & oriented x 3 with fluent speech, no focal motor/sensory deficits  LABORATORY DATA:  I have reviewed the data as listed    Component Value Date/Time   NA 139 07/06/2014 0910   NA 137 04/06/2014 0902   NA 140 03/25/2012  0823   K 4.5 07/06/2014 0910   K 4.3 04/06/2014 0902   K 4.9* 03/25/2012 0823   CL 103 04/06/2014 0902   CL 104 09/30/2012 0908   CL 97* 03/25/2012 0823   CO2 25 07/06/2014 0910   CO2 22 04/06/2014 0902   CO2 27 03/25/2012 0823   GLUCOSE 260* 07/06/2014 0910   GLUCOSE 192* 04/06/2014 0902   GLUCOSE 193* 09/30/2012 0908   GLUCOSE 194* 03/25/2012 0823   BUN 29.7* 07/06/2014 0910   BUN 19 04/06/2014 0902   BUN 15 03/25/2012 0823   CREATININE 1.1 07/06/2014 0910   CREATININE 0.85 04/06/2014 0902   CREATININE 0.9 03/25/2012 0823   CALCIUM 9.9 07/06/2014 0910   CALCIUM 9.8 04/06/2014 0902   CALCIUM 9.7 03/25/2012 0823   PROT 7.9 07/06/2014 0910   PROT 7.4 04/06/2014 0902   PROT 8.6* 03/25/2012 0823   ALBUMIN 4.3 07/06/2014 0910   ALBUMIN 4.4 04/06/2014 0902   AST 18 07/06/2014 0910   AST 21 04/06/2014 0902   AST 33 03/25/2012 0823   ALT 23 07/06/2014 0910   ALT 24 04/06/2014 0902   ALT 53* 03/25/2012 0823   ALKPHOS 45 07/06/2014 0910   ALKPHOS 46 04/06/2014 0902   ALKPHOS 44 03/25/2012 0823   BILITOT 0.32 07/06/2014 0910   BILITOT 0.3 04/06/2014 0902   BILITOT 0.70 03/25/2012 0823   GFRNONAA >60 01/26/2011 0530   GFRAA  01/26/2011 0530    >60        The eGFR has been calculated using the MDRD equation. This calculation has not been validated in all clinical situations. eGFR's persistently <60 mL/min signify possible Chronic Kidney Disease.    No results found for: SPEP, UPEP  Lab Results  Component Value Date   WBC 7.8 07/06/2014   NEUTROABS 4.2 07/06/2014   HGB 15.2 07/06/2014   HCT 46.8 07/06/2014   MCV 89.0 07/06/2014   PLT 168 07/06/2014      Chemistry      Component Value Date/Time   NA 139 07/06/2014 0910   NA 137 04/06/2014 0902   NA 140 03/25/2012 0823   K 4.5 07/06/2014 0910   K 4.3 04/06/2014 0902   K 4.9* 03/25/2012 0823   CL 103 04/06/2014 0902   CL 104 09/30/2012 0908   CL 97* 03/25/2012 0823   CO2 25 07/06/2014 0910   CO2 22  04/06/2014 0902   CO2 27 03/25/2012 0823   BUN 29.7* 07/06/2014 0910   BUN 19 04/06/2014 0902   BUN 15 03/25/2012 0823   CREATININE 1.1 07/06/2014 0910   CREATININE 0.85 04/06/2014 0902   CREATININE 0.9 03/25/2012 0823      Component Value Date/Time   CALCIUM 9.9 07/06/2014 0910   CALCIUM 9.8 04/06/2014 0902   CALCIUM 9.7 03/25/2012 0823   ALKPHOS 45 07/06/2014 0910   ALKPHOS 46 04/06/2014 0902   ALKPHOS 44 03/25/2012 0823   AST 18 07/06/2014 0910   AST 21 04/06/2014 0902   AST 33 03/25/2012 0823   ALT 23 07/06/2014 0910   ALT 24 04/06/2014 0902   ALT 53* 03/25/2012 0823   BILITOT 0.32 07/06/2014 0910   BILITOT 0.3 04/06/2014 0902   BILITOT 0.70 03/25/2012 0823       RADIOGRAPHIC STUDIES:I reviewed the most recent CT  scan which showed no evidence of recurrence. I have personally reviewed the radiological images as listed and agreed with the findings in the report.  ASSESSMENT & PLAN:  Cancer of hilus of left lung Clinically, he has no evidence of recurrence. CT scan of chest show persistent scarring with no change compared to prior imaging. I will see him back again in 6 months with history, physical examination and CXR.    Diabetic neuropathy, type II diabetes mellitus This is due to diabetic neuropathy. I recommend close follow-up with PCP.   Orders Placed This Encounter  Procedures  . DG Chest 2 View    Standing Status: Future     Number of Occurrences:      Standing Expiration Date: 09/12/2015    Order Specific Question:  Reason for exam:    Answer:  lung cancer    Order Specific Question:  Preferred imaging location?    Answer:  [facility]  . CBC with Differential    Standing Status: Future     Number of Occurrences:      Standing Expiration Date: 09/12/2015  . Comprehensive metabolic panel    Standing Status: Future     Number of Occurrences:      Standing Expiration Date: 09/12/2015   All questions were answered. The patient knows to call  the clinic with any problems, questions or concerns. No barriers to learning was detected. I spent 25 minutes counseling the patient face to face. The total time spent in the appointment was 30 minutes and more than 50% was on counseling and review of test results     [facility], [place], MD 07/13/2014 9:13 AM

## 2014-07-13 NOTE — Assessment & Plan Note (Signed)
Clinically, he has no evidence of recurrence. CT scan of chest show persistent scarring with no change compared to prior imaging. I will see him back again in 6 months with history, physical examination and CXR.

## 2014-07-13 NOTE — Telephone Encounter (Signed)
gv adn printed appt sched and avs for pt for May 2016

## 2014-10-22 ENCOUNTER — Telehealth: Payer: Self-pay | Admitting: Hematology and Oncology

## 2014-10-22 NOTE — Telephone Encounter (Signed)
pt stopped by today and per pt moved lb/[place] from may to j[place]. pt has new appt for july.

## 2015-01-11 ENCOUNTER — Ambulatory Visit: Payer: Medicare Other | Admitting: Hematology and Oncology

## 2015-01-11 ENCOUNTER — Other Ambulatory Visit: Payer: Medicare Other

## 2015-03-09 ENCOUNTER — Telehealth: Payer: Self-pay | Admitting: Hematology and Oncology

## 2015-03-09 NOTE — Telephone Encounter (Signed)
Pt's daughter called states pt will be out of town, confirmed new D/T and req schedule, mailed out schedule to pt... KJ

## 2015-03-26 ENCOUNTER — Ambulatory Visit: Payer: Self-pay | Admitting: Hematology and Oncology

## 2015-03-26 ENCOUNTER — Other Ambulatory Visit: Payer: Medicaid Other

## 2015-03-29 ENCOUNTER — Ambulatory Visit (HOSPITAL_BASED_OUTPATIENT_CLINIC_OR_DEPARTMENT_OTHER): Payer: Medicaid Other | Admitting: Hematology and Oncology

## 2015-03-29 ENCOUNTER — Other Ambulatory Visit (HOSPITAL_BASED_OUTPATIENT_CLINIC_OR_DEPARTMENT_OTHER): Payer: Medicaid Other

## 2015-03-29 ENCOUNTER — Encounter: Payer: Self-pay | Admitting: Hematology and Oncology

## 2015-03-29 ENCOUNTER — Telehealth: Payer: Self-pay | Admitting: Hematology and Oncology

## 2015-03-29 VITALS — BP 123/70 | HR 96 | Temp 97.4°F | Resp 20 | Ht 66.0 in | Wt 174.5 lb

## 2015-03-29 DIAGNOSIS — C3400 Malignant neoplasm of unspecified main bronchus: Secondary | ICD-10-CM

## 2015-03-29 DIAGNOSIS — M549 Dorsalgia, unspecified: Secondary | ICD-10-CM | POA: Diagnosis not present

## 2015-03-29 DIAGNOSIS — Z5900 Homelessness unspecified: Secondary | ICD-10-CM

## 2015-03-29 DIAGNOSIS — M546 Pain in thoracic spine: Secondary | ICD-10-CM

## 2015-03-29 DIAGNOSIS — C3401 Malignant neoplasm of right main bronchus: Secondary | ICD-10-CM

## 2015-03-29 DIAGNOSIS — C3402 Malignant neoplasm of left main bronchus: Secondary | ICD-10-CM

## 2015-03-29 DIAGNOSIS — E1140 Type 2 diabetes mellitus with diabetic neuropathy, unspecified: Secondary | ICD-10-CM

## 2015-03-29 LAB — COMPREHENSIVE METABOLIC PANEL (CC13)
ALT: 38 U/L (ref 0–55)
ANION GAP: 14 meq/L — AB (ref 3–11)
AST: 26 U/L (ref 5–34)
Albumin: 4 g/dL (ref 3.5–5.0)
Alkaline Phosphatase: 44 U/L (ref 40–150)
BILIRUBIN TOTAL: 0.28 mg/dL (ref 0.20–1.20)
BUN: 20 mg/dL (ref 7.0–26.0)
CO2: 20 meq/L — AB (ref 22–29)
Calcium: 9.5 mg/dL (ref 8.4–10.4)
Chloride: 106 mEq/L (ref 98–109)
Creatinine: 1 mg/dL (ref 0.7–1.3)
EGFR: 86 mL/min/{1.73_m2} — ABNORMAL LOW (ref >90)
Glucose: 251 mg/dl — ABNORMAL HIGH (ref 70–140)
POTASSIUM: 4.1 meq/L (ref 3.5–5.1)
SODIUM: 140 meq/L (ref 136–145)
TOTAL PROTEIN: 7.5 g/dL (ref 6.4–8.3)

## 2015-03-29 LAB — CBC WITH DIFFERENTIAL/PLATELET
BASO%: 1.4 % (ref 0.0–2.0)
Basophils Absolute: 0.1 10*3/uL (ref 0.0–0.1)
EOS%: 21.3 % — AB (ref 0.0–7.0)
Eosinophils Absolute: 1.7 10*3/uL — ABNORMAL HIGH (ref 0.0–0.5)
HCT: 45.3 % (ref 38.4–49.9)
HGB: 14.8 g/dL (ref 13.0–17.1)
LYMPH#: 1.9 10*3/uL (ref 0.9–3.3)
LYMPH%: 23.5 % (ref 14.0–49.0)
MCH: 29.5 pg (ref 27.2–33.4)
MCHC: 32.6 g/dL (ref 32.0–36.0)
MCV: 90.5 fL (ref 79.3–98.0)
MONO#: 0.6 10*3/uL (ref 0.1–0.9)
MONO%: 7.4 % (ref 0.0–14.0)
NEUT#: 3.7 10*3/uL (ref 1.5–6.5)
NEUT%: 46.4 % (ref 39.0–75.0)
PLATELETS: 158 10*3/uL (ref 140–400)
RBC: 5 10*6/uL (ref 4.20–5.82)
RDW: 14.6 % (ref 11.0–14.6)
WBC: 7.9 10*3/uL (ref 4.0–10.3)

## 2015-03-29 MED ORDER — OXYCODONE HCL 5 MG PO TABS: 5.0000 mg | ORAL_TABLET | ORAL | Status: DC | PRN

## 2015-03-29 NOTE — Telephone Encounter (Signed)
Pt confirmed labs/ov per 08/01 POF, gave pt avs and calendar... KJ

## 2015-03-29 NOTE — Assessment & Plan Note (Signed)
The patient have chronic peripheral neuropathy from poorly controlled diabetes. He is currently taking medication for diabetes. he will continue current medical management. I recommend close follow-up with primary care doctor for medication adjustment.

## 2015-03-29 NOTE — Assessment & Plan Note (Signed)
The patient had new onset of cough and new back pain. I am concerned that his cancer might be back. I will order an urgent CT scan for evaluation. In the meantime, I will give him pain medicine for back pain

## 2015-03-29 NOTE — Assessment & Plan Note (Signed)
The patient is currently homeless. I will consult social worker to see if she can assist in getting him housing.

## 2015-03-29 NOTE — Progress Notes (Signed)
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   Primary site: Lung (Right)   Staging method: AJCC 7th Edition   Clinical: Stage IIIA (T3, N2, M0) signed by Heath Lark, MD on 10/06/2013  1:17 PM   Summary: Stage IIIA (T3, N2, M0)      Cancer of hilus of left lung   10/05/2010 Imaging CT scan shows significant right lung mass with subcarinal lymphadenopathy as well as postobstructive pneumonitis   10/31/2010 Procedure Bronchoscopy and biopsy confirmed squamous cell carcinoma involvement of the right mainstem   11/21/2010 - 01/02/2011 Chemotherapy The patient underwent concurrent chemotherapy with radiation therapy with weekly carboplatin and Taxol   01/23/2011 - 03/13/2011 Chemotherapy The patient underwent 2 more cycles of consolidation chemotherapy with carboplatin and Taxol   07/06/2014 Imaging serial imaging study of the lung showed no recurrence of cancer.    INTERVAL HISTORY: Please see below for problem oriented charting. He returns for further follow-up. His appointment was delayed because of recent travel back to [place]. Over the past month, he has increasing new back pain and cough. The cough is nonproductive without hemoptysis The patient have chronic peripheral neuropathy with diabetes and his blood sugar ranges from 200-300. He has not seen his primary doctor recently. He has acute on chronic constipation recently. The patient remained homeless.  REVIEW OF SYSTEMS:   Constitutional: Denies fevers, chills or abnormal weight loss Eyes: Denies blurriness of vision Ears, nose, mouth, throat, and face: Denies mucositis or sore throat Respiratory: Denies cough, dyspnea or wheezes Cardiovascular: Denies palpitation, chest discomfort or lower extremity swelling Skin:  Denies abnormal skin rashes Lymphatics: Denies new lymphadenopathy or easy bruising Behavioral/Psych: Mood is stable, no new changes  All other systems were reviewed with the patient and are negative.  I have reviewed the past medical history, past surgical history, social history and family history with the patient and they are unchanged from previous note.  ALLERGIES:  has No Known Allergies.  MEDICATIONS:  Current Outpatient Prescriptions  Medication Sig Dispense Refill  . acetaminophen (TYLENOL) 325 MG tablet Take 650 mg by mouth every 6 (six) hours as needed.      . insulin aspart (NOVOLOG) 100 UNIT/ML injection Inject 15 Units into the skin 2 (two) times daily before a meal.    . metformin (FORTAMET) 1000 MG (OSM) 24 hr tablet Take 1 tablet by mouth Daily.    . sitaGLIPtan-metformin (JANUMET) 50-1000 MG per tablet Take 1 tablet by mouth 3 (three) times daily before meals.     Marland Kitchen oxyCODONE (OXY IR/ROXICODONE) 5 MG immediate release tablet Take 1 tablet (5 mg total) by mouth every 4 (four) hours as needed for severe pain. 30 tablet 0   No current facility-administered medications for this visit.    PHYSICAL EXAMINATION: ECOG PERFORMANCE STATUS: 1 - Symptomatic but completely ambulatory  Filed Vitals:   03/29/15 0940  BP: 123/70  Pulse: 96  Temp: 97.4 F (36.3 C)  Resp: 20   Filed Weights   03/29/15 0940  Weight: 174 lb 8 oz (79.153 kg)    GENERAL:alert, no distress and comfortable SKIN: skin color, texture, turgor are normal, no rashes or significant lesions EYES: normal, Conjunctiva are pink and non-injected, sclera clear OROPHARYNX:no exudate, no erythema and lips, buccal mucosa, and tongue normal  NECK: supple, thyroid  normal size, non-tender, without nodularity LYMPH:  no palpable lymphadenopathy in the cervical, axillary or inguinal LUNGS: clear to auscultation and percussion with normal breathing effort HEART: regular rate & rhythm and no murmurs and no lower  extremity edema ABDOMEN:abdomen soft, non-tender and normal bowel sounds Musculoskeletal:no cyanosis of digits and no clubbing . He has tenderness on palpation in the midthoracic region suspicious for bone fracture  NEURO: alert & oriented x 3 with fluent speech, no focal motor/sensory deficits  LABORATORY DATA:  I have reviewed the data as listed    Component Value Date/Time   NA 139 07/06/2014 0910   NA 137 04/06/2014 0902   NA 140 03/25/2012 0823   K 4.5 07/06/2014 0910   K 4.3 04/06/2014 0902   K 4.9* 03/25/2012 0823   CL 103 04/06/2014 0902   CL 104 09/30/2012 0908   CL 97* 03/25/2012 0823   CO2 25 07/06/2014 0910   CO2 22 04/06/2014 0902   CO2 27 03/25/2012 0823   GLUCOSE 260* 07/06/2014 0910   GLUCOSE 192* 04/06/2014 0902   GLUCOSE 193* 09/30/2012 0908   GLUCOSE 194* 03/25/2012 0823   BUN 29.7* 07/06/2014 0910   BUN 19 04/06/2014 0902   BUN 15 03/25/2012 0823   CREATININE 1.1 07/06/2014 0910   CREATININE 0.85 04/06/2014 0902   CREATININE 0.9 03/25/2012 0823   CALCIUM 9.9 07/06/2014 0910   CALCIUM 9.8 04/06/2014 0902   CALCIUM 9.7 03/25/2012 0823   PROT 7.9 07/06/2014 0910   PROT 7.4 04/06/2014 0902   PROT 8.6* 03/25/2012 0823   ALBUMIN 4.3 07/06/2014 0910   ALBUMIN 4.4 04/06/2014 0902   AST 18 07/06/2014 0910   AST 21 04/06/2014 0902   AST 33 03/25/2012 0823   ALT 23 07/06/2014 0910   ALT 24 04/06/2014 0902   ALT 53* 03/25/2012 0823   ALKPHOS 45 07/06/2014 0910   ALKPHOS 46 04/06/2014 0902   ALKPHOS 44 03/25/2012 0823   BILITOT 0.32 07/06/2014 0910   BILITOT 0.3 04/06/2014 0902   BILITOT 0.70 03/25/2012 0823   GFRNONAA >60 01/26/2011 0530   GFRAA  01/26/2011 0530    >60        The eGFR has been calculated using the MDRD equation. This calculation has not been validated in all clinical situations. eGFR's persistently <60 mL/min signify possible Chronic Kidney Disease.    No results found for: SPEP, UPEP  Lab Results  Component Value Date   WBC  7.9 03/29/2015   NEUTROABS 3.7 03/29/2015   HGB 14.8 03/29/2015   HCT 45.3 03/29/2015   MCV 90.5 03/29/2015   PLT 158 03/29/2015      Chemistry      Component Value Date/Time   NA 139 07/06/2014 0910   NA 137 04/06/2014 0902   NA 140 03/25/2012 0823   K 4.5 07/06/2014 0910   K 4.3 04/06/2014 0902   K 4.9* 03/25/2012 0823   CL 103 04/06/2014 0902   CL 104 09/30/2012 0908   CL 97* 03/25/2012 0823   CO2 25 07/06/2014 0910   CO2 22 04/06/2014 0902   CO2 27 03/25/2012 0823   BUN 29.7* 07/06/2014 0910   BUN 19 04/06/2014 0902   BUN 15 03/25/2012 0823   CREATININE 1.1 07/06/2014 0910   CREATININE 0.85 04/06/2014 0902   CREATININE 0.9 03/25/2012 0823      Component Value Date/Time   CALCIUM 9.9 07/06/2014 0910   CALCIUM 9.8 04/06/2014 0902   CALCIUM 9.7 03/25/2012 0823   ALKPHOS  45 07/06/2014 0910   ALKPHOS 46 04/06/2014 0902   ALKPHOS 44 03/25/2012 0823   AST 18 07/06/2014 0910   AST 21 04/06/2014 0902   AST 33 03/25/2012 0823   ALT 23 07/06/2014 0910   ALT 24 04/06/2014 0902   ALT 53* 03/25/2012 0823   BILITOT 0.32 07/06/2014 0910   BILITOT 0.3 04/06/2014 0902   BILITOT 0.70 03/25/2012 0823      ASSESSMENT & PLAN:  Cancer of hilus of left lung The patient had new onset of cough and new back pain. I am concerned that his cancer might be back. I will order an urgent CT scan for evaluation. In the meantime, I will give him pain medicine for back pain  Acute thoracic back pain This is of new onset. He has no focal neurological deficit. I will order a CT scan for evaluation and give him some medicine for pain  Diabetic neuropathy, type II diabetes mellitus The patient have chronic peripheral neuropathy from poorly controlled diabetes. He is currently taking medication for diabetes. he will continue current medical management. I recommend close follow-up with primary care doctor for medication adjustment.   Homeless The patient is currently homeless. I will  consult social worker to see if she can assist in getting him housing.   Orders Placed This Encounter  Procedures  . CT Chest W Contrast    Standing Status: Future     Number of Occurrences:      Standing Expiration Date: 05/28/2016    Order Specific Question:  Reason for Exam (SYMPTOM  OR DIAGNOSIS REQUIRED)    Answer:  lung cancer, new back pain and cough, exclude recurrence    Order Specific Question:  Preferred imaging location?    Answer:  [facility]   All questions were answered. The patient knows to call the clinic with any problems, questions or concerns. No barriers to learning was detected. I spent 40 minutes counseling the patient face to face. The total time spent in the appointment was 55 minutes and more than 50% was on counseling and review of test results     [facility], Kerby Borner, MD 03/29/2015 10:04 AM

## 2015-03-29 NOTE — Assessment & Plan Note (Signed)
This is of new onset. He has no focal neurological deficit. I will order a CT scan for evaluation and give him some medicine for pain

## 2015-03-30 ENCOUNTER — Encounter (HOSPITAL_COMMUNITY): Payer: Self-pay

## 2015-03-30 ENCOUNTER — Ambulatory Visit (HOSPITAL_COMMUNITY)
Admission: RE | Admit: 2015-03-30 | Discharge: 2015-03-30 | Disposition: A | Discharge status: Home / Self Care | Payer: Medicaid Other | Source: Ambulatory Visit | Attending: Hematology and Oncology | Admitting: Hematology and Oncology

## 2015-03-30 DIAGNOSIS — Z923 Personal history of irradiation: Secondary | ICD-10-CM | POA: Diagnosis not present

## 2015-03-30 DIAGNOSIS — C3402 Malignant neoplasm of left main bronchus: Secondary | ICD-10-CM | POA: Insufficient documentation

## 2015-03-30 DIAGNOSIS — M549 Dorsalgia, unspecified: Secondary | ICD-10-CM | POA: Diagnosis not present

## 2015-03-30 DIAGNOSIS — R911 Solitary pulmonary nodule: Secondary | ICD-10-CM | POA: Insufficient documentation

## 2015-03-30 DIAGNOSIS — I2510 Atherosclerotic heart disease of native coronary artery without angina pectoris: Secondary | ICD-10-CM | POA: Insufficient documentation

## 2015-03-30 DIAGNOSIS — I700 Atherosclerosis of aorta: Secondary | ICD-10-CM | POA: Insufficient documentation

## 2015-03-30 DIAGNOSIS — Z9221 Personal history of antineoplastic chemotherapy: Secondary | ICD-10-CM | POA: Diagnosis not present

## 2015-03-30 DIAGNOSIS — J439 Emphysema, unspecified: Secondary | ICD-10-CM | POA: Diagnosis not present

## 2015-03-30 MED ORDER — IOHEXOL 300 MG/ML  SOLN
100.0000 mL | Freq: Once | INTRAMUSCULAR | Status: AC | PRN
  Administered 2015-03-30: 80 mL via INTRAVENOUS

## 2015-04-01 ENCOUNTER — Telehealth: Payer: Self-pay | Admitting: Hematology and Oncology

## 2015-04-01 ENCOUNTER — Encounter: Payer: Self-pay | Admitting: Hematology and Oncology

## 2015-04-01 ENCOUNTER — Ambulatory Visit (HOSPITAL_BASED_OUTPATIENT_CLINIC_OR_DEPARTMENT_OTHER): Payer: Medicaid Other | Admitting: Hematology and Oncology

## 2015-04-01 VITALS — BP 123/72 | HR 90 | Temp 98.2°F | Resp 18 | Ht 66.0 in | Wt 175.1 lb

## 2015-04-01 DIAGNOSIS — M549 Dorsalgia, unspecified: Secondary | ICD-10-CM

## 2015-04-01 DIAGNOSIS — Z5900 Homelessness unspecified: Secondary | ICD-10-CM

## 2015-04-01 DIAGNOSIS — C3401 Malignant neoplasm of right main bronchus: Secondary | ICD-10-CM

## 2015-04-01 DIAGNOSIS — M546 Pain in thoracic spine: Secondary | ICD-10-CM

## 2015-04-01 DIAGNOSIS — C3402 Malignant neoplasm of left main bronchus: Secondary | ICD-10-CM

## 2015-04-01 NOTE — Telephone Encounter (Signed)
per pof to sch pt appt-gave avs-adv pt central sch will cal to sch scan

## 2015-04-01 NOTE — Assessment & Plan Note (Signed)
The patient is currently homeless. I will consult social worker to see if she can assist in getting him housing.

## 2015-04-01 NOTE — Progress Notes (Signed)
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   Primary site: Lung (Right)   Staging method: AJCC 7th Edition   Clinical: Stage IIIA (T3, N2, M0) signed by Heath Lark, MD on 10/06/2013  1:17 PM   Summary: Stage IIIA (T3, N2, M0)      Cancer of hilus of left lung   10/05/2010 Imaging CT scan shows significant right lung mass with subcarinal lymphadenopathy as well as postobstructive pneumonitis   10/31/2010 Procedure Bronchoscopy and biopsy confirmed squamous cell carcinoma involvement of the right mainstem   11/21/2010 - 01/02/2011 Chemotherapy The patient underwent concurrent chemotherapy with radiation therapy with weekly carboplatin and Taxol   01/23/2011 - 03/13/2011 Chemotherapy The patient underwent 2 more cycles of consolidation chemotherapy with carboplatin and Taxol   07/06/2014 Imaging serial imaging study of the lung showed no recurrence of cancer.   03/31/2015 Imaging  CT scan of the chest show no evidence of cancer.    INTERVAL HISTORY: Please see below for problem oriented charting.  he is here for further follow-up. His pain is well controlled.  REVIEW OF SYSTEMS:   Constitutional: Denies fevers, chills or abnormal weight loss Eyes: Denies blurriness of vision Ears, nose, mouth, throat, and face: Denies mucositis or sore throat Respiratory: Denies cough, dyspnea or wheezes Cardiovascular: Denies palpitation, chest discomfort or lower extremity swelling Gastrointestinal:  Denies nausea, heartburn or change in bowel habits Skin: Denies abnormal skin rashes Lymphatics: Denies new lymphadenopathy or easy bruising Neurological:Denies numbness, tingling or new weaknesses Behavioral/Psych: Mood is stable, no new changes  All other systems were reviewed with the  patient and are negative.  I have reviewed the past medical history, past surgical history, social history and family history with the patient and they are unchanged from previous note.  ALLERGIES:  has No Known Allergies.  MEDICATIONS:  Current Outpatient Prescriptions  Medication Sig Dispense Refill  . acetaminophen (TYLENOL) 325 MG tablet Take 650 mg by mouth every 6 (six) hours as needed.      . insulin aspart (NOVOLOG) 100 UNIT/ML injection Inject 15 Units into the skin 2 (two) times daily before a meal.    . metformin (FORTAMET) 1000 MG (OSM) 24 hr tablet Take 1 tablet by mouth Daily.    Marland Kitchen oxyCODONE (OXY IR/ROXICODONE) 5 MG immediate release tablet Take 1 tablet (5 mg total) by mouth every 4 (four) hours as needed for severe pain. 30 tablet 0  . sitaGLIPtan-metformin (JANUMET) 50-1000 MG per tablet Take 1 tablet by mouth 3 (three) times daily before meals.      No current facility-administered medications for this visit.    PHYSICAL EXAMINATION: ECOG PERFORMANCE STATUS: 0 - Asymptomatic  Filed Vitals:   04/01/15 1050  BP: 123/72  Pulse: 90  Temp: 98.2 F (36.8 C)  Resp: 18   Filed Weights   04/01/15 1050  Weight: 175 lb 1.6 oz (79.425 kg)    GENERAL:alert, no distress and comfortable SKIN: skin color, texture, turgor are normal, no rashes or significant lesions EYES: normal, Conjunctiva are pink and non-injected, sclera clear Musculoskeletal:no cyanosis of digits and no clubbing  NEURO: alert & oriented x 3 with fluent speech, no focal motor/sensory deficits  LABORATORY DATA:  I have reviewed the data as listed    Component Value Date/Time   NA 140 03/29/2015  0921   NA 137 04/06/2014 0902   NA 140 03/25/2012 0823   K 4.1 03/29/2015 0921   K 4.3 04/06/2014 0902   K 4.9* 03/25/2012 0823   CL 103 04/06/2014 0902   CL 104 09/30/2012 0908   CL 97* 03/25/2012 0823   CO2 20* 03/29/2015 0921   CO2 22 04/06/2014 0902   CO2 27 03/25/2012 0823   GLUCOSE 251*  03/29/2015 0921   GLUCOSE 192* 04/06/2014 0902   GLUCOSE 193* 09/30/2012 0908   GLUCOSE 194* 03/25/2012 0823   BUN 20.0 03/29/2015 0921   BUN 19 04/06/2014 0902   BUN 15 03/25/2012 0823   CREATININE 1.0 03/29/2015 0921   CREATININE 0.85 04/06/2014 0902   CREATININE 0.9 03/25/2012 0823   CALCIUM 9.5 03/29/2015 0921   CALCIUM 9.8 04/06/2014 0902   CALCIUM 9.7 03/25/2012 0823   PROT 7.5 03/29/2015 0921   PROT 7.4 04/06/2014 0902   PROT 8.6* 03/25/2012 0823   ALBUMIN 4.0 03/29/2015 0921   ALBUMIN 4.4 04/06/2014 0902   AST 26 03/29/2015 0921   AST 21 04/06/2014 0902   AST 33 03/25/2012 0823   ALT 38 03/29/2015 0921   ALT 24 04/06/2014 0902   ALT 53* 03/25/2012 0823   ALKPHOS 44 03/29/2015 0921   ALKPHOS 46 04/06/2014 0902   ALKPHOS 44 03/25/2012 0823   BILITOT 0.28 03/29/2015 0921   BILITOT 0.3 04/06/2014 0902   BILITOT 0.70 03/25/2012 0823   GFRNONAA >60 01/26/2011 0530   GFRAA  01/26/2011 0530    >60        The eGFR has been calculated using the MDRD equation. This calculation has not been validated in all clinical situations. eGFR's persistently <60 mL/min signify possible Chronic Kidney Disease.    No results found for: SPEP, UPEP  Lab Results  Component Value Date   WBC 7.9 03/29/2015   NEUTROABS 3.7 03/29/2015   HGB 14.8 03/29/2015   HCT 45.3 03/29/2015   MCV 90.5 03/29/2015   PLT 158 03/29/2015      Chemistry      Component Value Date/Time   NA 140 03/29/2015 0921   NA 137 04/06/2014 0902   NA 140 03/25/2012 0823   K 4.1 03/29/2015 0921   K 4.3 04/06/2014 0902   K 4.9* 03/25/2012 0823   CL 103 04/06/2014 0902   CL 104 09/30/2012 0908   CL 97* 03/25/2012 0823   CO2 20* 03/29/2015 0921   CO2 22 04/06/2014 0902   CO2 27 03/25/2012 0823   BUN 20.0 03/29/2015 0921   BUN 19 04/06/2014 0902   BUN 15 03/25/2012 0823   CREATININE 1.0 03/29/2015 0921   CREATININE 0.85 04/06/2014 0902   CREATININE 0.9 03/25/2012 0823      Component Value Date/Time    CALCIUM 9.5 03/29/2015 0921   CALCIUM 9.8 04/06/2014 0902   CALCIUM 9.7 03/25/2012 0823   ALKPHOS 44 03/29/2015 0921   ALKPHOS 46 04/06/2014 0902   ALKPHOS 44 03/25/2012 0823   AST 26 03/29/2015 0921   AST 21 04/06/2014 0902   AST 33 03/25/2012 0823   ALT 38 03/29/2015 0921   ALT 24 04/06/2014 0902   ALT 53* 03/25/2012 0823   BILITOT 0.28 03/29/2015 0921   BILITOT 0.3 04/06/2014 0902   BILITOT 0.70 03/25/2012 0823       RADIOGRAPHIC STUDIES: I reviewed the CT scan results myself and with the patient I have personally reviewed the radiological images as listed and agreed with the findings in  the report.  ASSESSMENT & PLAN:  Cancer of hilus of left lung The patient had new onset of cough and new back pain.  CT scan showed no evidence of cancer. I will see him back in 6 months for further follow-up by repeat imaging study.     Homeless The patient is currently homeless. I will consult social worker to see if she can assist in getting him housing.    Acute thoracic back pain Cause is unknown. He was given a short course of pain medicine for short-term pain control. I recommend he follows with primary care doctor for chronic pain management.   Orders Placed This Encounter  Procedures  . CT Chest Wo Contrast    Standing Status: Future     Number of Occurrences:      Standing Expiration Date: 05/31/2016    Order Specific Question:  Reason for Exam (SYMPTOM  OR DIAGNOSIS REQUIRED)    Answer:  staging lung ca exclude recurrence, lung nodule    Order Specific Question:  Preferred imaging location?    Answer:  [facility]  . CBC with Differential/Platelet    Standing Status: Future     Number of Occurrences:      Standing Expiration Date: 05/31/2016  . Comprehensive metabolic panel    Standing Status: Future     Number of Occurrences:      Standing Expiration Date: 05/31/2016   All questions were answered. The patient knows to call the clinic with any  problems, questions or concerns. No barriers to learning was detected. I spent 15 minutes counseling the patient face to face. The total time spent in the appointment was 20 minutes and more than 50% was on counseling and review of test results     [facility], [place], MD 04/01/2015 11:51 AM

## 2015-04-01 NOTE — Assessment & Plan Note (Signed)
Cause is unknown. He was given a short course of pain medicine for short-term pain control. I recommend he follows with primary care doctor for chronic pain management.

## 2015-04-01 NOTE — Assessment & Plan Note (Signed)
The patient had new onset of cough and new back pain.  CT scan showed no evidence of cancer. I will see him back in 6 months for further follow-up by repeat imaging study.

## 2015-04-02 ENCOUNTER — Encounter: Payer: Self-pay | Admitting: *Deleted

## 2015-04-02 NOTE — Progress Notes (Signed)
[place] Work  Clinical Social Work was referred by Futures trader for assessment of psychosocial needs.  Clinical Social Worker met with patient in [place] office to offer support and assess for needs.  George Griffin did not have interpreter present for today's visit.  Patient speaks minimal [place], CSW requested in EPIC that interpreter be requested for all upcoming appointments.  George Griffin shared he received "good news" from Dr. Alvy Bimler, he reported he did not have cancer recurrence.  The patient is currently homeless, reported he has a good friend that is allowing him to stay with her.  George Griffin was denied [place] because he was not deemed disabled.  CSW encouraged patient to utilize [employer] (support for homeless persons)- CSW had provided patient with this information at previous appointments.  CSW and patient discussed possible part-time jobs patient could apply for to generate income.  CSW also provided information on [place] congregrational nurse, George Griffin, as other possible resource.  Patient plans to call CSW with additional questions or concerns.   Polo Riley, MSW, LCSW, OSW-C Clinical Social Worker [facility] [phone]

## 2015-10-04 ENCOUNTER — Other Ambulatory Visit: Payer: Medicaid Other

## 2015-10-04 ENCOUNTER — Ambulatory Visit (HOSPITAL_COMMUNITY): Payer: Medicaid Other

## 2015-10-05 ENCOUNTER — Other Ambulatory Visit: Payer: Medicaid Other

## 2015-10-05 ENCOUNTER — Telehealth: Payer: Self-pay | Admitting: Hematology and Oncology

## 2015-10-05 ENCOUNTER — Ambulatory Visit (HOSPITAL_BASED_OUTPATIENT_CLINIC_OR_DEPARTMENT_OTHER): Payer: Medicaid Other | Admitting: Hematology and Oncology

## 2015-10-05 ENCOUNTER — Other Ambulatory Visit: Payer: Self-pay | Admitting: *Deleted

## 2015-10-05 ENCOUNTER — Encounter: Payer: Self-pay | Admitting: Hematology and Oncology

## 2015-10-05 VITALS — BP 139/73 | HR 100 | Temp 97.8°F | Resp 18 | Wt 167.3 lb

## 2015-10-05 DIAGNOSIS — E039 Hypothyroidism, unspecified: Secondary | ICD-10-CM | POA: Insufficient documentation

## 2015-10-05 DIAGNOSIS — C3402 Malignant neoplasm of left main bronchus: Secondary | ICD-10-CM

## 2015-10-05 DIAGNOSIS — K5900 Constipation, unspecified: Secondary | ICD-10-CM | POA: Insufficient documentation

## 2015-10-05 DIAGNOSIS — Z659 Problem related to unspecified psychosocial circumstances: Secondary | ICD-10-CM

## 2015-10-05 DIAGNOSIS — Z609 Problem related to social environment, unspecified: Secondary | ICD-10-CM

## 2015-10-05 DIAGNOSIS — R634 Abnormal weight loss: Secondary | ICD-10-CM | POA: Diagnosis not present

## 2015-10-05 HISTORY — DX: Hypothyroidism, unspecified: E03.9

## 2015-10-05 NOTE — Telephone Encounter (Signed)
per pof to sch pt appt-gave pt copy of avs °

## 2015-10-05 NOTE — Assessment & Plan Note (Signed)
He complained of fatigue and I'm concerned about constipation and recent weight loss. As above, I will check another CT scan but would like to thyroid function tests to exclude abnormal hypothyroidism as a cause of his symptoms

## 2015-10-05 NOTE — Telephone Encounter (Signed)
cld and spoke to [facility] and she stated she will bring pt to have labs done 2/8 @ 8:30

## 2015-10-05 NOTE — Progress Notes (Signed)
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   Primary site: Lung (Right)   Staging method: AJCC 7th Edition   Clinical: Stage IIIA (T3, N2, M0) signed by Heath Lark, MD on 10/06/2013  1:17 PM   Summary: Stage IIIA (T3, N2, M0)      Cancer of hilus of left lung ([place])   10/05/2010 Imaging CT scan shows significant right lung mass with subcarinal lymphadenopathy as well as postobstructive pneumonitis   10/31/2010 Procedure Bronchoscopy and biopsy confirmed squamous cell carcinoma involvement of the right mainstem   11/21/2010 - 01/02/2011 Chemotherapy The patient underwent concurrent chemotherapy with radiation therapy with weekly carboplatin and Taxol   01/23/2011 - 03/13/2011 Chemotherapy The patient underwent 2 more cycles of consolidation chemotherapy with carboplatin and Taxol   07/06/2014 Imaging serial imaging study of the lung showed no recurrence of cancer.   03/31/2015 Imaging  CT scan of the chest show no evidence of cancer.    INTERVAL HISTORY: history is obtained via an interpreter Please see below for problem oriented charting.  he has recent weight loss and new onset constipation. He complained of fatigue. He was homeless and lost insurance recently Crrently he got his insurance back and is living with friends. He denies cough, fevers or chest pain   REVIEW OF SYSTEMS:   Constitutional: Denies fevers, chills Eyes: Denies blurriness of vision Ears, nose, mouth, throat, and face: Denies mucositis or sore throat Respiratory: Denies cough, dyspnea or wheezes Cardiovascular: Denies palpitation, chest discomfort or lower extremity swelling Skin: Denies abnormal skin rashes Lymphatics: Denies new lymphadenopathy or easy bruising Neurological:Denies numbness, tingling  or new weaknesses Behavioral/Psych: Mood is stable, no new changes  All other systems were reviewed with the patient and are negative.  I have reviewed the past medical history, past surgical history, social history and family history with the patient and they are unchanged from previous note.  ALLERGIES:  has No Known Allergies.  MEDICATIONS:  Current Outpatient Prescriptions  Medication Sig Dispense Refill  . acetaminophen (TYLENOL) 325 MG tablet Take 650 mg by mouth every 6 (six) hours as needed.      . metformin (FORTAMET) 1000 MG (OSM) 24 hr tablet Take 1 tablet by mouth Daily.    . sitaGLIPtan-metformin (JANUMET) 50-1000 MG per tablet Take 1 tablet by mouth 3 (three) times daily before meals.      No current facility-administered medications for this visit.    PHYSICAL EXAMINATION: ECOG PERFORMANCE STATUS: 1 - Symptomatic but completely ambulatory  Filed Vitals:   10/05/15 0849  BP: 139/73  Pulse: 100  Temp: 97.8 F (36.6 C)  Resp: 18   Filed Weights   10/05/15 0849  Weight: 167 lb 4.8 oz (75.887 kg)    GENERAL:alert, no distress and comfortable SKIN: skin color, texture, turgor are normal, no rashes or significant lesions EYES: normal, Conjunctiva are pink and non-injected, sclera clear OROPHARYNX:no exudate, no erythema and lips, buccal mucosa, and tongue normal  NECK: supple, thyroid normal size, non-tender, without nodularity LYMPH:  no palpable lymphadenopathy in the cervical, axillary or inguinal LUNGS: clear to auscultation and percussion with normal breathing effort HEART: regular rate & rhythm and no murmurs and no lower extremity edema ABDOMEN:abdomen soft, non-tender and normal bowel sounds Musculoskeletal:no cyanosis of digits and no clubbing  NEURO: alert &  oriented x 3 with fluent speech, no focal motor/sensory deficits  LABORATORY DATA:  I have reviewed the data as listed    Component Value Date/Time   NA 140 03/29/2015 0921   NA 137 04/06/2014  0902   NA 140 03/25/2012 0823   K 4.1 03/29/2015 0921   K 4.3 04/06/2014 0902   K 4.9* 03/25/2012 0823   CL 103 04/06/2014 0902   CL 104 09/30/2012 0908   CL 97* 03/25/2012 0823   CO2 20* 03/29/2015 0921   CO2 22 04/06/2014 0902   CO2 27 03/25/2012 0823   GLUCOSE 251* 03/29/2015 0921   GLUCOSE 192* 04/06/2014 0902   GLUCOSE 193* 09/30/2012 0908   GLUCOSE 194* 03/25/2012 0823   BUN 20.0 03/29/2015 0921   BUN 19 04/06/2014 0902   BUN 15 03/25/2012 0823   CREATININE 1.0 03/29/2015 0921   CREATININE 0.85 04/06/2014 0902   CREATININE 0.9 03/25/2012 0823   CALCIUM 9.5 03/29/2015 0921   CALCIUM 9.8 04/06/2014 0902   CALCIUM 9.7 03/25/2012 0823   PROT 7.5 03/29/2015 0921   PROT 7.4 04/06/2014 0902   PROT 8.6* 03/25/2012 0823   ALBUMIN 4.0 03/29/2015 0921   ALBUMIN 4.4 04/06/2014 0902   ALBUMIN 4.0 03/25/2012 0823   AST 26 03/29/2015 0921   AST 21 04/06/2014 0902   AST 33 03/25/2012 0823   ALT 38 03/29/2015 0921   ALT 24 04/06/2014 0902   ALT 53* 03/25/2012 0823   ALKPHOS 44 03/29/2015 0921   ALKPHOS 46 04/06/2014 0902   ALKPHOS 44 03/25/2012 0823   BILITOT 0.28 03/29/2015 0921   BILITOT 0.3 04/06/2014 0902   BILITOT 0.70 03/25/2012 0823   GFRNONAA >60 01/26/2011 0530   GFRAA  01/26/2011 0530    >60        The eGFR has been calculated using the MDRD equation. This calculation has not been validated in all clinical situations. eGFR's persistently <60 mL/min signify possible Chronic Kidney Disease.    No results found for: SPEP, UPEP  Lab Results  Component Value Date   WBC 7.9 03/29/2015   NEUTROABS 3.7 03/29/2015   HGB 14.8 03/29/2015   HCT 45.3 03/29/2015   MCV 90.5 03/29/2015   PLT 158 03/29/2015      Chemistry      Component Value Date/Time   NA 140 03/29/2015 0921   NA 137 04/06/2014 0902   NA 140 03/25/2012 0823   K 4.1 03/29/2015 0921   K 4.3 04/06/2014 0902   K 4.9* 03/25/2012 0823   CL 103 04/06/2014 0902   CL 104 09/30/2012 0908   CL 97*  03/25/2012 0823   CO2 20* 03/29/2015 0921   CO2 22 04/06/2014 0902   CO2 27 03/25/2012 0823   BUN 20.0 03/29/2015 0921   BUN 19 04/06/2014 0902   BUN 15 03/25/2012 0823   CREATININE 1.0 03/29/2015 0921   CREATININE 0.85 04/06/2014 0902   CREATININE 0.9 03/25/2012 0823      Component Value Date/Time   CALCIUM 9.5 03/29/2015 0921   CALCIUM 9.8 04/06/2014 0902   CALCIUM 9.7 03/25/2012 0823   ALKPHOS 44 03/29/2015 0921   ALKPHOS 46 04/06/2014 0902   ALKPHOS 44 03/25/2012 0823   AST 26 03/29/2015 0921   AST 21 04/06/2014 0902   AST 33 03/25/2012 0823   ALT 38 03/29/2015 0921   ALT 24 04/06/2014 0902   ALT 53* 03/25/2012 0823   BILITOT 0.28 03/29/2015 0921   BILITOT 0.3 04/06/2014 0902  BILITOT 0.70 03/25/2012 0823      ASSESSMENT & PLAN:  Cancer of hilus of left lung  His examination is benign but I am concerned about the weight loss. I recommend repeat CT scan next week for further evaluation.  Acquired hypothyroidism  He complained of fatigue and I'm concerned about constipation and recent weight loss. As above, I will check another CT scan but would like to thyroid function tests to exclude abnormal hypothyroidism as a cause of his symptoms  Poor social situation  He has poor social situation and recently was homeless. Currently, his living with his friends We gave him number to the interpreter phone line to call a fever for help/medical assistance in the future  Constipation, acute  He had recent constipation, cause unknown. As above, I will check thyroid function tests. If the test came back negative, we will get him referred to get colonoscopy  Weight loss, unintentional  He had recent unintentional weight loss.  we will get CT imaging and additional workup as above. He might need colonoscopy evaluation in view of recent constipation   Orders Placed This Encounter  Procedures  . CT Chest W Contrast    Standing Status: Future     Number of Occurrences:       Standing Expiration Date: 12/04/2016    Order Specific Question:  Reason for Exam (SYMPTOM  OR DIAGNOSIS REQUIRED)    Answer:  lung cancer, lung nodules, weight loss    Order Specific Question:  Preferred imaging location?    Answer:  [facility]  . TSH    Standing Status: Future     Number of Occurrences:      Standing Expiration Date: 11/08/2016   All questions were answered. The patient knows to call the clinic with any problems, questions or concerns. No barriers to learning was detected. I spent 25 minutes counseling the patient face to face. The total time spent in the appointment was 30 minutes and more than 50% was on counseling and review of test results     [facility], [place], MD 10/05/2015 11:27 AM

## 2015-10-05 NOTE — Assessment & Plan Note (Signed)
He has poor social situation and recently was homeless. Currently, his living with his friends We gave him number to the interpreter phone line to call a fever for help/medical assistance in the future

## 2015-10-05 NOTE — Assessment & Plan Note (Signed)
He had recent constipation, cause unknown. As above, I will check thyroid function tests. If the test came back negative, we will get him referred to get colonoscopy

## 2015-10-05 NOTE — Assessment & Plan Note (Signed)
His examination is benign but I am concerned about the weight loss. I recommend repeat CT scan next week for further evaluation.

## 2015-10-05 NOTE — Assessment & Plan Note (Signed)
He had recent unintentional weight loss.  we will get CT imaging and additional workup as above. He might need colonoscopy evaluation in view of recent constipation

## 2015-10-06 ENCOUNTER — Other Ambulatory Visit: Payer: Medicaid Other

## 2015-10-06 ENCOUNTER — Telehealth: Payer: Self-pay | Admitting: *Deleted

## 2015-10-06 NOTE — Telephone Encounter (Signed)
-----   Message from Cathlean Cower, RN sent at 10/06/2015  4:02 PM EST ----- Regarding: labs George Griffin,  Pt was supposed to have labs done here on Tuesday after he saw Dr. Alvy Bimler, but it didn't get scheduled.  Lab r/s to this morning, but it looks like pt didn't come for labs this morning either.  He will need labs r/s before his CT Scan.  Can you f/u w/ Scheduling?  Maybe best to do morning of CT so pt doesn't have to make a separate trip.  Thanks, Cameo

## 2015-10-06 NOTE — Telephone Encounter (Signed)
Left message for sister regarding missed lab appt. Asked to call back to get rescheduled.

## 2015-10-07 ENCOUNTER — Telehealth: Payer: Self-pay | Admitting: *Deleted

## 2015-10-07 ENCOUNTER — Telehealth: Payer: Self-pay | Admitting: Hematology and Oncology

## 2015-10-07 NOTE — Telephone Encounter (Signed)
per pof to sch pt appt-per Tammy pt is aware

## 2015-10-07 NOTE — Telephone Encounter (Signed)
Spoke with sister regarding lab appt 2/13 @ 1100. Left message via interpreter on patient's phone and emergency contact's phone regarding lab appt Monday, 2/13 @ 1100.

## 2015-10-11 ENCOUNTER — Encounter (HOSPITAL_COMMUNITY): Payer: Self-pay

## 2015-10-11 ENCOUNTER — Telehealth: Payer: Self-pay | Admitting: *Deleted

## 2015-10-11 ENCOUNTER — Other Ambulatory Visit (HOSPITAL_BASED_OUTPATIENT_CLINIC_OR_DEPARTMENT_OTHER): Payer: Medicaid Other

## 2015-10-11 ENCOUNTER — Ambulatory Visit (HOSPITAL_COMMUNITY)
Admission: RE | Admit: 2015-10-11 | Discharge: 2015-10-11 | Disposition: A | Discharge status: Home / Self Care | Payer: Medicaid Other | Source: Ambulatory Visit | Attending: Hematology and Oncology | Admitting: Hematology and Oncology

## 2015-10-11 ENCOUNTER — Ambulatory Visit (HOSPITAL_COMMUNITY): Payer: Medicaid Other

## 2015-10-11 DIAGNOSIS — E039 Hypothyroidism, unspecified: Secondary | ICD-10-CM | POA: Diagnosis not present

## 2015-10-11 DIAGNOSIS — R918 Other nonspecific abnormal finding of lung field: Secondary | ICD-10-CM | POA: Diagnosis not present

## 2015-10-11 DIAGNOSIS — I2510 Atherosclerotic heart disease of native coronary artery without angina pectoris: Secondary | ICD-10-CM | POA: Insufficient documentation

## 2015-10-11 DIAGNOSIS — C3402 Malignant neoplasm of left main bronchus: Secondary | ICD-10-CM | POA: Diagnosis not present

## 2015-10-11 LAB — CBC WITH DIFFERENTIAL/PLATELET
BASO%: 1.3 % (ref 0.0–2.0)
Basophils Absolute: 0.1 10*3/uL (ref 0.0–0.1)
EOS%: 19.8 % — ABNORMAL HIGH (ref 0.0–7.0)
Eosinophils Absolute: 1.5 10*3/uL — ABNORMAL HIGH (ref 0.0–0.5)
HEMATOCRIT: 43.9 % (ref 38.4–49.9)
HGB: 14.2 g/dL (ref 13.0–17.1)
LYMPH#: 1.9 10*3/uL (ref 0.9–3.3)
LYMPH%: 24.2 % (ref 14.0–49.0)
MCH: 28.6 pg (ref 27.2–33.4)
MCHC: 32.4 g/dL (ref 32.0–36.0)
MCV: 88.1 fL (ref 79.3–98.0)
MONO#: 0.5 10*3/uL (ref 0.1–0.9)
MONO%: 6.7 % (ref 0.0–14.0)
NEUT%: 48 % (ref 39.0–75.0)
NEUTROS ABS: 3.7 10*3/uL (ref 1.5–6.5)
Platelets: 162 10*3/uL (ref 140–400)
RBC: 4.98 10*6/uL (ref 4.20–5.82)
RDW: 14.1 % (ref 11.0–14.6)
WBC: 7.8 10*3/uL (ref 4.0–10.3)

## 2015-10-11 LAB — COMPREHENSIVE METABOLIC PANEL
ALBUMIN: 3.8 g/dL (ref 3.5–5.0)
ALK PHOS: 45 U/L (ref 40–150)
ALT: 22 U/L (ref 0–55)
AST: 17 U/L (ref 5–34)
Anion Gap: 12 mEq/L — ABNORMAL HIGH (ref 3–11)
BILIRUBIN TOTAL: 0.49 mg/dL (ref 0.20–1.20)
BUN: 13.4 mg/dL (ref 7.0–26.0)
CO2: 24 mEq/L (ref 22–29)
Calcium: 8.7 mg/dL (ref 8.4–10.4)
Chloride: 103 mEq/L (ref 98–109)
Creatinine: 1 mg/dL (ref 0.7–1.3)
EGFR: 84 mL/min/{1.73_m2} — ABNORMAL LOW (ref >90)
GLUCOSE: 331 mg/dL — AB (ref 70–140)
Potassium: 4.3 mEq/L (ref 3.5–5.1)
SODIUM: 138 meq/L (ref 136–145)
TOTAL PROTEIN: 7.2 g/dL (ref 6.4–8.3)

## 2015-10-11 LAB — TSH: TSH: 0.802 m[IU]/L (ref 0.320–4.118)

## 2015-10-11 MED ORDER — IOHEXOL 300 MG/ML  SOLN
75.0000 mL | Freq: Once | INTRAMUSCULAR | Status: AC | PRN
  Administered 2015-10-11: 75 mL via INTRAVENOUS

## 2015-10-11 NOTE — Telephone Encounter (Signed)
-----   Message from Heath Lark, MD sent at 10/11/2015  2:42 PM EST ----- Regarding: please get interpreter help Please get intepreter to call patient that Ct scan looks OK. His blood sugar is high; he needs to see PCP for medication adjustment ----- Message -----    From: Rad Results In Interface    Sent: 10/11/2015   2:33 PM      To: Heath Lark, MD

## 2015-10-11 NOTE — Telephone Encounter (Signed)
Pt notified of Dr. Calton Dach message below by [place] interpreter, Emogene Morgan.   Pt told Emogene Morgan that he had a gap in his Medicaid coverage and was off his diabetic medication for a while.  He told her he has Medicaid again and going to get his Diabetic meds filled today.

## 2016-03-24 ENCOUNTER — Other Ambulatory Visit: Payer: Self-pay

## 2016-03-24 ENCOUNTER — Ambulatory Visit (HOSPITAL_BASED_OUTPATIENT_CLINIC_OR_DEPARTMENT_OTHER): Payer: Medicaid Other

## 2016-03-24 ENCOUNTER — Ambulatory Visit (HOSPITAL_BASED_OUTPATIENT_CLINIC_OR_DEPARTMENT_OTHER): Payer: Medicaid Other | Admitting: Nurse Practitioner

## 2016-03-24 ENCOUNTER — Encounter: Payer: Self-pay | Admitting: Nurse Practitioner

## 2016-03-24 VITALS — BP 117/74 | HR 111 | Temp 97.5°F | Resp 18 | Ht 66.0 in | Wt 169.5 lb

## 2016-03-24 DIAGNOSIS — G893 Neoplasm related pain (acute) (chronic): Secondary | ICD-10-CM

## 2016-03-24 DIAGNOSIS — L02212 Cutaneous abscess of back [any part, except buttock]: Secondary | ICD-10-CM | POA: Diagnosis not present

## 2016-03-24 DIAGNOSIS — C3402 Malignant neoplasm of left main bronchus: Secondary | ICD-10-CM

## 2016-03-24 LAB — CBC WITH DIFFERENTIAL/PLATELET
BASO%: 1.1 % (ref 0.0–2.0)
Basophils Absolute: 0.1 10*3/uL (ref 0.0–0.1)
EOS%: 9.4 % — ABNORMAL HIGH (ref 0.0–7.0)
Eosinophils Absolute: 0.9 10*3/uL — ABNORMAL HIGH (ref 0.0–0.5)
HCT: 45.3 % (ref 38.4–49.9)
HEMOGLOBIN: 14.8 g/dL (ref 13.0–17.1)
LYMPH%: 21.8 % (ref 14.0–49.0)
MCH: 29.7 pg (ref 27.2–33.4)
MCHC: 32.7 g/dL (ref 32.0–36.0)
MCV: 90.6 fL (ref 79.3–98.0)
MONO#: 0.7 10*3/uL (ref 0.1–0.9)
MONO%: 7.7 % (ref 0.0–14.0)
NEUT%: 60 % (ref 39.0–75.0)
NEUTROS ABS: 5.7 10*3/uL (ref 1.5–6.5)
Platelets: 165 10*3/uL (ref 140–400)
RBC: 5.01 10*6/uL (ref 4.20–5.82)
RDW: 14.4 % (ref 11.0–14.6)
WBC: 9.5 10*3/uL (ref 4.0–10.3)
lymph#: 2.1 10*3/uL (ref 0.9–3.3)

## 2016-03-24 LAB — COMPREHENSIVE METABOLIC PANEL
ALBUMIN: 4 g/dL (ref 3.5–5.0)
ALK PHOS: 40 U/L (ref 40–150)
ALT: 27 U/L (ref 0–55)
AST: 17 U/L (ref 5–34)
Anion Gap: 16 mEq/L — ABNORMAL HIGH (ref 3–11)
BILIRUBIN TOTAL: 0.34 mg/dL (ref 0.20–1.20)
BUN: 15.3 mg/dL (ref 7.0–26.0)
CO2: 21 meq/L — AB (ref 22–29)
CREATININE: 1.1 mg/dL (ref 0.7–1.3)
Calcium: 9.8 mg/dL (ref 8.4–10.4)
Chloride: 101 mEq/L (ref 98–109)
EGFR: 78 mL/min/{1.73_m2} — ABNORMAL LOW (ref >90)
GLUCOSE: 251 mg/dL — AB (ref 70–140)
Potassium: 4.6 mEq/L (ref 3.5–5.1)
SODIUM: 138 meq/L (ref 136–145)
TOTAL PROTEIN: 7.8 g/dL (ref 6.4–8.3)

## 2016-03-24 NOTE — Assessment & Plan Note (Signed)
Patient is status post both chemotherapy and radiation treatments completed in 2012.  Patient's most recent restaging CT of the chest obtained on 10/11/2015 revealed no acute findings.  Patient reported back to the [place] [facility] today and reported via interpreter that he occasionally feels fullness in his throat and has some difficulty swallowing.  Specific foods.  He also occasionally feels a fullness in his right chest is well.  He states that he notices it the most when he awakens in the morning.  He also complains of some achiness to the back of his neck and his upper back and shoulders as well.  He denies any specific chest pain, chest pressure, shortness breath, or pain with inspiration at this time.  He also denies any recent weight loss or fever/chills.  Patient states that he feels the same symptoms.  He felt when he was previously diagnosed with breast cancer.  Exam today reveals lung sounds clear bilaterally; in no acute respiratory distress.  There is no obvious adenopathy palpated.  Will review all findings with Dr. Alvy Bimler on Monday morning .  03/27/2016.  Patient may need a restaging CT for further evaluation.  In the meantime-patient was advised to go directly to the emergency department over the weekend with any new worries or concerns.  Entire visit was performed with an interpreter.

## 2016-03-24 NOTE — Progress Notes (Signed)
SYMPTOM MANAGEMENT CLINIC    Chief Complaint: Right upper chest discomfort and left back abscess  HPI:  George Griffin 55 y.o. male diagnosed with lung cancer.  Patient is status post chemotherapy and radiation treatments.  Currently undergoing observation only.   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   Primary site: Lung (Right)   Staging method: AJCC 7th Edition   Clinical: Stage IIIA (T3, N2, M0) signed by Heath Lark, MD on 10/06/2013  1:17 PM   Summary: Stage IIIA (T3, N2, M0)      Cancer of hilus of left lung ([place])   10/05/2010 Imaging    CT scan shows significant right lung mass with subcarinal lymphadenopathy as well as postobstructive pneumonitis     10/31/2010 Procedure    Bronchoscopy and biopsy confirmed squamous cell carcinoma involvement of the right mainstem     11/21/2010 - 01/02/2011 Chemotherapy    The patient underwent concurrent chemotherapy with radiation therapy with weekly carboplatin and Taxol     01/23/2011 - 03/13/2011 Chemotherapy    The patient underwent 2 more cycles of consolidation chemotherapy with carboplatin and Taxol     07/06/2014 Imaging    serial imaging study of the lung showed no recurrence of cancer.     03/31/2015 Imaging     CT scan of the chest show no evidence of cancer.     10/11/2015 Imaging    CT scan showed stable disease      Review of Systems  Cardiovascular:       Patient reports a vague, intermittent feeling of fullness to his right neck and right upper chest.  Skin:       Skin lesion to left upper back  All other systems reviewed and are negative.   Past Medical History:  Diagnosis Date  . Acquired hypothyroidism 10/05/2015  . Constipation   . Diabetes mellitus    metfomin  . ED (erectile dysfunction)   . Hepatic steatosis 07/26/11   severe ct chest  . Homeless 04/06/2014  . Hyperlipidemia   . Hypertension   . Lung cancer ([place])    lung ca  dx5/02/2011  . Neuropathy of finger    mild s/p chemotherapy  . Pneumonia    hx    History reviewed. No pertinent surgical history.  has DM; ASVD; Hyperlipidemia; Diabetes mellitus; Diabetic neuropathy, type II diabetes mellitus ([place]); Hepatic steatosis; Cancer of hilus of left lung ([place]); Homeless; Dyspnea; Acute thoracic back pain; Acquired hypothyroidism; Poor social situation; Constipation, acute; Weight loss, unintentional; and Abscess of back on his problem list.    has No Known Allergies.    Medication List       Accurate as of 03/24/16  7:48 PM. Always use your most recent med list.          acetaminophen 325 MG tablet Commonly known as:  TYLENOL Take 650 mg by mouth every 6 (six) hours as needed.   metformin 1000 MG (OSM) 24 hr tablet Commonly known as:  FORTAMET Take 1 tablet by mouth Daily.   sitaGLIPtin-metformin 50-1000 MG tablet Commonly known as:  JANUMET Take 1 tablet by mouth 3 (three) times daily before meals.        PHYSICAL EXAMINATION  Oncology Vitals 03/24/2016 10/05/2015  Height 168 cm -  Weight 76.885 kg 75.887 kg  Weight (lbs) 169 lbs 8 oz 167 lbs 5 oz  BMI (kg/m2) 27.36 kg/m2 27 kg/m2  Temp 97.5  97.8  Pulse 111 100  Resp 18 18  Resp (Historical as of 03/28/12) - -  SpO2 97 99  BSA (m2) 1.89 m2 1.88 m2   BP Readings from Last 2 Encounters:  03/24/16 117/74  10/05/15 139/73    Physical Exam  Constitutional: He is oriented to person, place, and time and well-developed, well-nourished, and in no distress.  HENT:  Head: Normocephalic and atraumatic.  Mouth/Throat: Oropharynx is clear and moist.  Eyes: Conjunctivae and EOM are normal. Pupils are equal, round, and reactive to light. Right eye exhibits no discharge. Left eye exhibits no discharge. No scleral icterus.  Neck: Normal range of motion. Neck supple. No JVD present. No tracheal deviation present. No thyromegaly present.  Cardiovascular: Normal rate, regular rhythm, normal heart  sounds and intact distal pulses.   Pulmonary/Chest: Effort normal and breath sounds normal. No stridor. No respiratory distress. He has no wheezes. He has no rales. He exhibits no tenderness.  Abdominal: Soft. Bowel sounds are normal. He exhibits no distension and no mass. There is no tenderness. There is no rebound and no guarding.  Musculoskeletal: Normal range of motion. He exhibits no edema, tenderness or deformity.  Lymphadenopathy:    He has no cervical adenopathy.  Neurological: He is alert and oriented to person, place, and time. Gait normal.  Skin: Skin is warm and dry. No rash noted. No erythema. No pallor.  Patient has a small pustule to his left upper back.  See further notes for details.  Psychiatric: Affect normal.  Nursing note and vitals reviewed.   LABORATORY DATA:. Appointment on 03/24/2016  Component Date Value Ref Range Status  . WBC 03/24/2016 9.5  4.0 - 10.3 10e3/uL Final  . NEUT# 03/24/2016 5.7  1.5 - 6.5 10e3/uL Final  . HGB 03/24/2016 14.8  13.0 - 17.1 g/dL Final  . HCT 03/24/2016 45.3  38.4 - 49.9 % Final  . Platelets 03/24/2016 165  140 - 400 10e3/uL Final  . MCV 03/24/2016 90.6  79.3 - 98.0 fL Final  . MCH 03/24/2016 29.7  27.2 - 33.4 pg Final  . MCHC 03/24/2016 32.7  32.0 - 36.0 g/dL Final  . RBC 03/24/2016 5.01  4.20 - 5.82 10e6/uL Final  . RDW 03/24/2016 14.4  11.0 - 14.6 % Final  . lymph# 03/24/2016 2.1  0.9 - 3.3 10e3/uL Final  . MONO# 03/24/2016 0.7  0.1 - 0.9 10e3/uL Final  . Eosinophils Absolute 03/24/2016 0.9* 0.0 - 0.5 10e3/uL Final  . Basophils Absolute 03/24/2016 0.1  0.0 - 0.1 10e3/uL Final  . NEUT% 03/24/2016 60.0  39.0 - 75.0 % Final  . LYMPH% 03/24/2016 21.8  14.0 - 49.0 % Final  . MONO% 03/24/2016 7.7  0.0 - 14.0 % Final  . EOS% 03/24/2016 9.4* 0.0 - 7.0 % Final  . BASO% 03/24/2016 1.1  0.0 - 2.0 % Final  . Sodium 03/24/2016 138  136 - 145 mEq/L Final  . Potassium 03/24/2016 4.6  3.5 - 5.1 mEq/L Final  . Chloride 03/24/2016 101  98 -  109 mEq/L Final  . CO2 03/24/2016 21* 22 - 29 mEq/L Final  . Glucose 03/24/2016 251* 70 - 140 mg/dl Final  . BUN 03/24/2016 15.3  7.0 - 26.0 mg/dL Final  . Creatinine 03/24/2016 1.1  0.7 - 1.3 mg/dL Final  . Total Bilirubin 03/24/2016 0.34  0.20 - 1.20 mg/dL Final  . Alkaline Phosphatase 03/24/2016 40  40 - 150 U/L Final  . AST 03/24/2016 17  5 - 34 U/L  Final  . ALT 03/24/2016 27  0 - 55 U/L Final  . Total Protein 03/24/2016 7.8  6.4 - 8.3 g/dL Final  . Albumin 03/24/2016 4.0  3.5 - 5.0 g/dL Final  . Calcium 03/24/2016 9.8  8.4 - 10.4 mg/dL Final  . Anion Gap 03/24/2016 16* 3 - 11 mEq/L Final  . EGFR 03/24/2016 78* >90 ml/min/1.73 m2 Final     RADIOGRAPHIC STUDIES: No results found.  ASSESSMENT/PLAN:    Cancer of hilus of left lung [facility]) Patient is status post both chemotherapy and radiation treatments completed in 2012.  Patient's most recent restaging CT of the chest obtained on 10/11/2015 revealed no acute findings.  Patient reported back to the [facility] today and reported via interpreter that he occasionally feels fullness in his throat and has some difficulty swallowing.  Specific foods.  He also occasionally feels a fullness in his right chest is well.  He states that he notices it the most when he awakens in the morning.  He also complains of some achiness to the back of his neck and his upper back and shoulders as well.  He denies any specific chest pain, chest pressure, shortness breath, or pain with inspiration at this time.  He also denies any recent weight loss or fever/chills.  Patient states that he feels the same symptoms.  He felt when he was previously diagnosed with breast cancer.  Exam today reveals lung sounds clear bilaterally; in no acute respiratory distress.  There is no obvious adenopathy palpated.  Will review all findings with Dr. Alvy Bimler on Monday morning .  03/27/2016.  Patient may need a restaging CT for further evaluation.  In the meantime-patient was  advised to go directly to the emergency department over the weekend with any new worries or concerns.  Entire visit was performed with an interpreter.    Abscess of back Patient states that he has had a lesion to his left upper back that has been bothering him for some time.  He states it is slowly improving.  Exam today reveals a small inflamed pustule to patient's left upper back.  There is no surrounding erythema, or red streaks.  Patient was advised to place warm moist compresses to the site and to continue monitoring closely.  Patient was advised to call/return or go directly to the emergency department over the weekend if it worsens.   Patient stated understanding of all instructions; and was in agreement with this plan of care. The patient knows to call the clinic with any problems, questions or concerns.   Total time spent with patient was 25 minutes;  with greater than 75 percent of that time spent in face to face counseling regarding patient's symptoms,  and coordination of care and follow up.  Disclaimer:This dictation was prepared with Dragon/digital dictation along with [employer]. Any transcriptional errors that result from this process are unintentional.  Drue Second, NP 03/24/2016

## 2016-03-24 NOTE — Assessment & Plan Note (Signed)
Patient states that he has had a lesion to his left upper back that has been bothering him for some time.  He states it is slowly improving.  Exam today reveals a small inflamed pustule to patient's left upper back.  There is no surrounding erythema, or red streaks.  Patient was advised to place warm moist compresses to the site and to continue monitoring closely.  Patient was advised to call/return or go directly to the emergency department over the weekend if it worsens.

## 2016-04-03 ENCOUNTER — Telehealth: Payer: Self-pay | Admitting: Hematology and Oncology

## 2016-04-03 ENCOUNTER — Other Ambulatory Visit: Payer: Medicaid Other

## 2016-04-03 ENCOUNTER — Encounter: Payer: Self-pay | Admitting: Hematology and Oncology

## 2016-04-03 ENCOUNTER — Ambulatory Visit (HOSPITAL_BASED_OUTPATIENT_CLINIC_OR_DEPARTMENT_OTHER): Payer: Medicaid Other | Admitting: Hematology and Oncology

## 2016-04-03 VITALS — BP 127/74 | HR 103 | Temp 98.7°F | Resp 18 | Ht 66.0 in | Wt 171.1 lb

## 2016-04-03 DIAGNOSIS — L02212 Cutaneous abscess of back [any part, except buttock]: Secondary | ICD-10-CM

## 2016-04-03 DIAGNOSIS — E1140 Type 2 diabetes mellitus with diabetic neuropathy, unspecified: Secondary | ICD-10-CM

## 2016-04-03 DIAGNOSIS — C3402 Malignant neoplasm of left main bronchus: Secondary | ICD-10-CM

## 2016-04-03 NOTE — Telephone Encounter (Signed)
Gave pt cal & avs °

## 2016-04-03 NOTE — Assessment & Plan Note (Signed)
The patient have chronic peripheral neuropathy from poorly controlled diabetes. He is currently taking medication for diabetes. he will continue current medical management. I recommend close follow-up with primary care doctor for medication adjustment.

## 2016-04-03 NOTE — Assessment & Plan Note (Signed)
His last imaging study 6 months ago was negative for recurrence but the patient complained of some mild cough and congestion. The patient is at high risk of cancer recurrence. I will order repeat blood work and imaging study next week for further review and to exclude cancer recurrence

## 2016-04-03 NOTE — Assessment & Plan Note (Signed)
The skin on his back has healed completely. There is no signs of infection. I reassured the patient

## 2016-04-03 NOTE — Progress Notes (Signed)
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   Primary site: Lung (Right)   Staging method: AJCC 7th Edition   Clinical: Stage IIIA (T3, N2, M0) signed by Heath Lark, MD on 10/06/2013  1:17 PM   Summary: Stage IIIA (T3, N2, M0)      Cancer of hilus of left lung ([place])   10/05/2010 Imaging    CT scan shows significant right lung mass with subcarinal lymphadenopathy as well as postobstructive pneumonitis     10/31/2010 Procedure    Bronchoscopy and biopsy confirmed squamous cell carcinoma involvement of the right mainstem     11/21/2010 - 01/02/2011 Chemotherapy    The patient underwent concurrent chemotherapy with radiation therapy with weekly carboplatin and Taxol     01/23/2011 - 03/13/2011 Chemotherapy    The patient underwent 2 more cycles of consolidation chemotherapy with carboplatin and Taxol     07/06/2014 Imaging    serial imaging study of the lung showed no recurrence of cancer.     03/31/2015 Imaging     CT scan of the chest show no evidence of cancer.     10/11/2015 Imaging    CT scan showed stable disease      INTERVAL HISTORY: Please see below for problem oriented charting. He returns for further follow-up. He has some cough and congestion and he is worried about cancer recurrence. He also had mild back pain. He was seen recently by nurse practitioner for abscess on his back According to the patient, he has completed a course of antibiotics recently  REVIEW OF SYSTEMS:   Constitutional: Denies fevers, chills or abnormal weight loss Eyes: Denies blurriness of vision Ears, nose, mouth, throat, and face: Denies mucositis or sore throat Cardiovascular: Denies palpitation, chest discomfort or lower extremity swelling Gastrointestinal:  Denies nausea,  heartburn or change in bowel habits Lymphatics: Denies new lymphadenopathy or easy bruising Neurological:Denies numbness, tingling or new weaknesses Behavioral/Psych: Mood is stable, no new changes  All other systems were reviewed with the patient and are negative.  I have reviewed the past medical history, past surgical history, social history and family history with the patient and they are unchanged from previous note.  ALLERGIES:  has No Known Allergies.  MEDICATIONS:  Current Outpatient Prescriptions  Medication Sig Dispense Refill  . acetaminophen (TYLENOL) 325 MG tablet Take 650 mg by mouth every 6 (six) hours as needed.      . metformin (FORTAMET) 1000 MG (OSM) 24 hr tablet Take 1 tablet by mouth Daily.    . sitaGLIPtan-metformin (JANUMET) 50-1000 MG per tablet Take 1 tablet by mouth 3 (three) times daily before meals.      No current facility-administered medications for this visit.     PHYSICAL EXAMINATION: ECOG PERFORMANCE STATUS: 1 - Symptomatic but completely ambulatory  Vitals:   04/03/16 1227  BP: 127/74  Pulse: (!) 103  Resp: 18  Temp: 98.7 F (37.1 C)   Filed Weights   04/03/16 1227  Weight: 171 lb 1.6 oz (77.6 kg)    GENERAL:alert, no distress and comfortable SKIN: The patient has well-healed scar on his back with no signs of abscess EYES: normal, Conjunctiva are pink and non-injected, sclera clear OROPHARYNX:no exudate, no erythema and lips, buccal mucosa, and tongue normal  NECK: supple, thyroid normal size, non-tender, without nodularity  LYMPH:  no palpable lymphadenopathy in the cervical, axillary or inguinal LUNGS: clear to auscultation and percussion with normal breathing effort HEART: regular rate & rhythm and no murmurs and no lower extremity edema ABDOMEN:abdomen soft, non-tender and normal bowel sounds Musculoskeletal:no cyanosis of digits and no clubbing  NEURO: alert & oriented x 3 with fluent speech, no focal motor/sensory  deficits  LABORATORY DATA:  I have reviewed the data as listed    Component Value Date/Time   NA 138 03/24/2016 1252   K 4.6 03/24/2016 1252   CL 103 04/06/2014 0902   CL 104 09/30/2012 0908   CO2 21 (L) 03/24/2016 1252   GLUCOSE 251 (H) 03/24/2016 1252   GLUCOSE 193 (H) 09/30/2012 0908   BUN 15.3 03/24/2016 1252   CREATININE 1.1 03/24/2016 1252   CALCIUM 9.8 03/24/2016 1252   PROT 7.8 03/24/2016 1252   ALBUMIN 4.0 03/24/2016 1252   AST 17 03/24/2016 1252   ALT 27 03/24/2016 1252   ALKPHOS 40 03/24/2016 1252   BILITOT 0.34 03/24/2016 1252   GFRNONAA >60 01/26/2011 0530   GFRAA  01/26/2011 0530    >60        The eGFR has been calculated using the MDRD equation. This calculation has not been validated in all clinical situations. eGFR's persistently <60 mL/min signify possible Chronic Kidney Disease.    No results found for: SPEP, UPEP  Lab Results  Component Value Date   WBC 9.5 03/24/2016   NEUTROABS 5.7 03/24/2016   HGB 14.8 03/24/2016   HCT 45.3 03/24/2016   MCV 90.6 03/24/2016   PLT 165 03/24/2016      Chemistry      Component Value Date/Time   NA 138 03/24/2016 1252   K 4.6 03/24/2016 1252   CL 103 04/06/2014 0902   CL 104 09/30/2012 0908   CO2 21 (L) 03/24/2016 1252   BUN 15.3 03/24/2016 1252   CREATININE 1.1 03/24/2016 1252      Component Value Date/Time   CALCIUM 9.8 03/24/2016 1252   ALKPHOS 40 03/24/2016 1252   AST 17 03/24/2016 1252   ALT 27 03/24/2016 1252   BILITOT 0.34 03/24/2016 1252     ASSESSMENT & PLAN:  Cancer of hilus of left lung ([place]) His last imaging study 6 months ago was negative for recurrence but the patient complained of some mild cough and congestion. The patient is at high risk of cancer recurrence. I will order repeat blood work and imaging study next week for further review and to exclude cancer recurrence  Diabetic neuropathy, type II diabetes mellitus The patient have chronic peripheral neuropathy from poorly  controlled diabetes. He is currently taking medication for diabetes. he will continue current medical management. I recommend close follow-up with primary care doctor for medication adjustment.   Abscess of back The skin on his back has healed completely. There is no signs of infection. I reassured the patient   Orders Placed This Encounter  Procedures  . CT CHEST W CONTRAST    Standing Status:   Future    Standing Expiration Date:   05/08/2017    Order Specific Question:   Reason for exam:    Answer:   history of lung fibrosis, lung cancer, worsening SOB, exclude recurrence    Order Specific Question:   Preferred imaging location?    Answer:   [facility]  . CBC with Differential/Platelet    Standing Status:   Future    Standing Expiration Date:   05/08/2017  .  Comprehensive metabolic panel    Standing Status:   Future    Standing Expiration Date:   05/08/2017   All questions were answered. The patient knows to call the clinic with any problems, questions or concerns. No barriers to learning was detected. I spent 15 minutes counseling the patient face to face. The total time spent in the appointment was 20 minutes and more than 50% was on counseling and review of test results     [facility], [place], MD 04/03/2016 5:57 PM

## 2016-04-13 ENCOUNTER — Telehealth: Payer: Self-pay | Admitting: *Deleted

## 2016-04-13 NOTE — Telephone Encounter (Signed)
CT is scheduled for 8/21 at 8 am.   Lab is scheduled same day after CT.   Sent Scheduling Message to move lab from 8/21 to 8/22 before MD visit.

## 2016-04-13 NOTE — Telephone Encounter (Signed)
CT scan ordered by Dr. Alvy Bimler on 8/7 to be done by 8/21 has not been scheduled yet.   Notified by Gaspar Bidding that CT was approved on 8/14.   I called Radiology Scheduler and s/w Peggy.   Requested she please schedule scan for 8/21 and notify pt.   She agreed.

## 2016-04-17 ENCOUNTER — Encounter (HOSPITAL_COMMUNITY): Payer: Self-pay

## 2016-04-17 ENCOUNTER — Ambulatory Visit (HOSPITAL_COMMUNITY)
Admission: RE | Admit: 2016-04-17 | Discharge: 2016-04-17 | Disposition: A | Discharge status: Home / Self Care | Payer: Medicaid Other | Source: Ambulatory Visit | Attending: Hematology and Oncology | Admitting: Hematology and Oncology

## 2016-04-17 ENCOUNTER — Other Ambulatory Visit: Payer: Medicaid Other

## 2016-04-17 DIAGNOSIS — R938 Abnormal findings on diagnostic imaging of other specified body structures: Secondary | ICD-10-CM | POA: Insufficient documentation

## 2016-04-17 DIAGNOSIS — R911 Solitary pulmonary nodule: Secondary | ICD-10-CM | POA: Insufficient documentation

## 2016-04-17 DIAGNOSIS — J439 Emphysema, unspecified: Secondary | ICD-10-CM | POA: Diagnosis not present

## 2016-04-17 DIAGNOSIS — M47814 Spondylosis without myelopathy or radiculopathy, thoracic region: Secondary | ICD-10-CM | POA: Insufficient documentation

## 2016-04-17 DIAGNOSIS — R590 Localized enlarged lymph nodes: Secondary | ICD-10-CM | POA: Insufficient documentation

## 2016-04-17 DIAGNOSIS — K760 Fatty (change of) liver, not elsewhere classified: Secondary | ICD-10-CM | POA: Insufficient documentation

## 2016-04-17 DIAGNOSIS — Z923 Personal history of irradiation: Secondary | ICD-10-CM | POA: Insufficient documentation

## 2016-04-17 DIAGNOSIS — I2510 Atherosclerotic heart disease of native coronary artery without angina pectoris: Secondary | ICD-10-CM | POA: Diagnosis not present

## 2016-04-17 DIAGNOSIS — I700 Atherosclerosis of aorta: Secondary | ICD-10-CM | POA: Insufficient documentation

## 2016-04-17 DIAGNOSIS — C3402 Malignant neoplasm of left main bronchus: Secondary | ICD-10-CM

## 2016-04-17 MED ORDER — IOPAMIDOL (ISOVUE-300) INJECTION 61%
75.0000 mL | Freq: Once | INTRAVENOUS | Status: AC | PRN
  Administered 2016-04-17: 75 mL via INTRAVENOUS

## 2016-04-18 ENCOUNTER — Ambulatory Visit (HOSPITAL_BASED_OUTPATIENT_CLINIC_OR_DEPARTMENT_OTHER): Payer: Medicaid Other | Admitting: Hematology and Oncology

## 2016-04-18 ENCOUNTER — Encounter: Payer: Self-pay | Admitting: Hematology and Oncology

## 2016-04-18 ENCOUNTER — Other Ambulatory Visit (HOSPITAL_BASED_OUTPATIENT_CLINIC_OR_DEPARTMENT_OTHER): Payer: Medicaid Other

## 2016-04-18 DIAGNOSIS — Z85118 Personal history of other malignant neoplasm of bronchus and lung: Secondary | ICD-10-CM

## 2016-04-18 DIAGNOSIS — E1140 Type 2 diabetes mellitus with diabetic neuropathy, unspecified: Secondary | ICD-10-CM

## 2016-04-18 DIAGNOSIS — C3402 Malignant neoplasm of left main bronchus: Secondary | ICD-10-CM

## 2016-04-18 DIAGNOSIS — K760 Fatty (change of) liver, not elsewhere classified: Secondary | ICD-10-CM

## 2016-04-18 DIAGNOSIS — R0989 Other specified symptoms and signs involving the circulatory and respiratory systems: Secondary | ICD-10-CM | POA: Insufficient documentation

## 2016-04-18 LAB — COMPREHENSIVE METABOLIC PANEL
ALBUMIN: 3.9 g/dL (ref 3.5–5.0)
ALK PHOS: 43 U/L (ref 40–150)
ALT: 26 U/L (ref 0–55)
AST: 17 U/L (ref 5–34)
Anion Gap: 11 mEq/L (ref 3–11)
BUN: 15.9 mg/dL (ref 7.0–26.0)
CALCIUM: 9.4 mg/dL (ref 8.4–10.4)
CHLORIDE: 101 meq/L (ref 98–109)
CO2: 24 mEq/L (ref 22–29)
CREATININE: 1 mg/dL (ref 0.7–1.3)
EGFR: 80 mL/min/{1.73_m2} — ABNORMAL LOW (ref >90)
GLUCOSE: 361 mg/dL — AB (ref 70–140)
POTASSIUM: 4.6 meq/L (ref 3.5–5.1)
SODIUM: 136 meq/L (ref 136–145)
Total Bilirubin: 0.39 mg/dL (ref 0.20–1.20)
Total Protein: 7.4 g/dL (ref 6.4–8.3)

## 2016-04-18 LAB — CBC WITH DIFFERENTIAL/PLATELET
BASO%: 2 % (ref 0.0–2.0)
Basophils Absolute: 0.1 10*3/uL (ref 0.0–0.1)
EOS%: 19.4 % — AB (ref 0.0–7.0)
Eosinophils Absolute: 1.3 10*3/uL — ABNORMAL HIGH (ref 0.0–0.5)
HEMATOCRIT: 44.8 % (ref 38.4–49.9)
HEMOGLOBIN: 14.5 g/dL (ref 13.0–17.1)
LYMPH#: 1.8 10*3/uL (ref 0.9–3.3)
LYMPH%: 26.6 % (ref 14.0–49.0)
MCH: 29.4 pg (ref 27.2–33.4)
MCHC: 32.4 g/dL (ref 32.0–36.0)
MCV: 90.6 fL (ref 79.3–98.0)
MONO#: 0.5 10*3/uL (ref 0.1–0.9)
MONO%: 7.9 % (ref 0.0–14.0)
NEUT#: 3 10*3/uL (ref 1.5–6.5)
NEUT%: 44.1 % (ref 39.0–75.0)
PLATELETS: 161 10*3/uL (ref 140–400)
RBC: 4.95 10*6/uL (ref 4.20–5.82)
RDW: 13.8 % (ref 11.0–14.6)
WBC: 6.8 10*3/uL (ref 4.0–10.3)

## 2016-04-18 NOTE — Assessment & Plan Note (Signed)
The patient had fatty liver disease likely due to poorly controlled diabetes. I would defer to his primary care doctor for medical management for diabetes

## 2016-04-18 NOTE — Progress Notes (Signed)
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   Primary site: Lung (Right)   Staging method: AJCC 7th Edition   Clinical: Stage IIIA (T3, N2, M0) signed by Heath Lark, MD on 10/06/2013  1:17 PM   Summary: Stage IIIA (T3, N2, M0)      History of lung cancer   10/05/2010 Imaging    CT scan shows significant right lung mass with subcarinal lymphadenopathy as well as postobstructive pneumonitis      10/31/2010 Procedure    Bronchoscopy and biopsy confirmed squamous cell carcinoma involvement of the right mainstem      11/21/2010 - 01/02/2011 Chemotherapy    The patient underwent concurrent chemotherapy with radiation therapy with weekly carboplatin and Taxol      01/23/2011 - 03/13/2011 Chemotherapy    The patient underwent 2 more cycles of consolidation chemotherapy with carboplatin and Taxol      07/06/2014 Imaging    serial imaging study of the lung showed no recurrence of cancer.      03/31/2015 Imaging     CT scan of the chest show no evidence of cancer.      10/11/2015 Imaging    CT scan showed stable disease      04/17/2016 Imaging    CT scan showed stable disease       INTERVAL HISTORY: Please see below for problem oriented charting. He returns for further follow-up. He continues to complain of mild chest congestion Denies cough. He denies recent smoking  REVIEW OF SYSTEMS:   Constitutional: Denies fevers, chills or abnormal weight loss Eyes: Denies blurriness of vision Ears, nose, mouth, throat, and face: Denies mucositis or sore throat Respiratory: Denies cough, dyspnea or wheezes Cardiovascular: Denies palpitation, chest discomfort or lower extremity swelling Gastrointestinal:  Denies nausea, heartburn or change in bowel habits Skin: Denies  abnormal skin rashes Lymphatics: Denies new lymphadenopathy or easy bruising Neurological:Denies numbness, tingling or new weaknesses Behavioral/Psych: Mood is stable, no new changes  All other systems were reviewed with the patient and are negative.  I have reviewed the past medical history, past surgical history, social history and family history with the patient and they are unchanged from previous note.  ALLERGIES:  has No Known Allergies.  MEDICATIONS:  Current Outpatient Prescriptions  Medication Sig Dispense Refill  . acetaminophen (TYLENOL) 325 MG tablet Take 650 mg by mouth every 6 (six) hours as needed.      . metformin (FORTAMET) 1000 MG (OSM) 24 hr tablet Take 1 tablet by mouth Daily.    . sitaGLIPtan-metformin (JANUMET) 50-1000 MG per tablet Take 1 tablet by mouth 3 (three) times daily before meals.      No current facility-administered medications for this visit.     PHYSICAL EXAMINATION: ECOG PERFORMANCE STATUS: 0 - Asymptomatic  Vitals:   04/18/16 1027  BP: 120/66  Pulse: 86  Resp: 18  Temp: 98.1 F (36.7 C)   Filed Weights   04/18/16 1027  Weight: 171 lb 1.6 oz (77.6 kg)    GENERAL:alert, no distress and comfortable SKIN: skin color, texture, turgor are normal, no rashes or significant lesions EYES: normal, Conjunctiva are pink and non-injected, sclera clear Musculoskeletal:no cyanosis of digits and no clubbing  NEURO: alert & oriented x 3 with fluent speech, no focal motor/sensory deficits  LABORATORY DATA:  I have reviewed the data as listed    Component Value Date/Time   NA 138 03/24/2016 1252   K 4.6 03/24/2016 1252   CL 103 04/06/2014 0902   CL 104 09/30/2012 0908   CO2 21 (L) 03/24/2016 1252   GLUCOSE 251 (H) 03/24/2016 1252   GLUCOSE 193 (H) 09/30/2012 0908   BUN 15.3 03/24/2016 1252   CREATININE 1.1 03/24/2016 1252   CALCIUM 9.8 03/24/2016 1252   PROT 7.8 03/24/2016 1252   ALBUMIN 4.0 03/24/2016 1252   AST 17 03/24/2016 1252   ALT 27  03/24/2016 1252   ALKPHOS 40 03/24/2016 1252   BILITOT 0.34 03/24/2016 1252   GFRNONAA >60 01/26/2011 0530   GFRAA  01/26/2011 0530    >60        The eGFR has been calculated using the MDRD equation. This calculation has not been validated in all clinical situations. eGFR's persistently <60 mL/min signify possible Chronic Kidney Disease.    No results found for: SPEP, UPEP  Lab Results  Component Value Date   WBC 6.8 04/18/2016   NEUTROABS 3.0 04/18/2016   HGB 14.5 04/18/2016   HCT 44.8 04/18/2016   MCV 90.6 04/18/2016   PLT 161 04/18/2016      Chemistry      Component Value Date/Time   NA 138 03/24/2016 1252   K 4.6 03/24/2016 1252   CL 103 04/06/2014 0902   CL 104 09/30/2012 0908   CO2 21 (L) 03/24/2016 1252   BUN 15.3 03/24/2016 1252   CREATININE 1.1 03/24/2016 1252      Component Value Date/Time   CALCIUM 9.8 03/24/2016 1252   ALKPHOS 40 03/24/2016 1252   AST 17 03/24/2016 1252   ALT 27 03/24/2016 1252   BILITOT 0.34 03/24/2016 1252       RADIOGRAPHIC STUDIES: I have personally reviewed the radiological images as listed and agreed with the findings in the report. Ct Chest W Contrast  Result Date: 04/17/2016 CLINICAL DATA:  Lung cancer diagnosed in 2012. Upper back pain for 3-4 weeks. Cough. EXAM: CT CHEST WITH CONTRAST TECHNIQUE: Multidetector CT imaging of the chest was performed during intravenous contrast administration. CONTRAST:  69m ISOVUE-300 IOPAMIDOL (ISOVUE-300) INJECTION 61% COMPARISON:  10/11/2015 FINDINGS: Cardiovascular: Coronary, aortic arch, and branch vessel atherosclerotic vascular disease. Small amount of anterior pericardial fluid. Mediastinum/Nodes: Distal paraesophageal lymph node 0.5 cm in short axis image 100 series 2. Lungs/Pleura: Paraseptal emphysema. Right perihilar airspace opacity and airway thickening with associated air bronchograms not appreciably changed from 10/11/2015. Is a right suprahilar lymph node measuring 6 mm in  short axis, similar to prior, on image 46/5, tracking along the tracheobronchial tree. Left-sided airway thickening is present as well. Minimal lingular scarring. Upper Abdomen: A lymph node above the celiac trunk just to the right of the caudate lobe of the liver measures 1.2 cm in short axis,, essentially stable over the last several years. Hepatic steatosis noted. Musculoskeletal: Thoracic spondylosis, with posterior osseous ridging/ intervertebral spurring most notable at the T1-2 level but also at T4-5 and T5-6. IMPRESSION: 1. Stable right perihilar airspace opacity favoring radiation fibrosis. 2. Stable size of a 6 mm right upper lobe nodule or peribronchovascular lymph node, image 46/5. May warrant continued observation. 3. Paraseptal emphysema. 4. Coronary, aortic arch, and branch vessel atherosclerotic vascular disease. 5. Hepatic steatosis. 6. Mildly enlarged upper abdominal lymph node, but stable over the last several years 7. Thoracic spondylosis, with intervertebral spurring most notable at T1-2. Electronically Signed   By: WThayer Jew  Janeece Fitting M.D.   On: 04/17/2016 09:10     ASSESSMENT & PLAN:  History of lung cancer Repeat imaging study show no evidence of cancer recurrence. The patient is more than 5 years out. He is a long-term cancer survivor. I will discharge him from the clinic  Diabetic neuropathy, type II diabetes mellitus The patient have chronic peripheral neuropathy from poorly controlled diabetes. He is currently taking medication for diabetes. he will continue current medical management. I recommend close follow-up with primary care doctor for medication adjustment.   Hepatic steatosis The patient had fatty liver disease likely due to poorly controlled diabetes. I would defer to his primary care doctor for medical management for diabetes  Chest congestion The sensation of chest congestion is likely related to fibrotic changes related to prior radiation treatment. CT  scan show minimum change. The patient is reassured   No orders of the defined types were placed in this encounter.  All questions were answered. The patient knows to call the clinic with any problems, questions or concerns. No barriers to learning was detected. I spent 15 minutes counseling the patient face to face. The total time spent in the appointment was 20 minutes and more than 50% was on counseling and review of test results     [facility], [place], MD 04/18/2016 10:46 AM

## 2016-04-18 NOTE — Assessment & Plan Note (Signed)
Repeat imaging study show no evidence of cancer recurrence. The patient is more than 5 years out. He is a long-term cancer survivor. I will discharge him from the clinic

## 2016-04-18 NOTE — Assessment & Plan Note (Signed)
The patient have chronic peripheral neuropathy from poorly controlled diabetes. He is currently taking medication for diabetes. he will continue current medical management. I recommend close follow-up with primary care doctor for medication adjustment.

## 2016-04-18 NOTE — Assessment & Plan Note (Signed)
The sensation of chest congestion is likely related to fibrotic changes related to prior radiation treatment. CT scan show minimum change. The patient is reassured

## 2016-05-08 DIAGNOSIS — E7800 Pure hypercholesterolemia, unspecified: Secondary | ICD-10-CM | POA: Diagnosis not present

## 2016-05-08 DIAGNOSIS — E1165 Type 2 diabetes mellitus with hyperglycemia: Secondary | ICD-10-CM | POA: Diagnosis not present

## 2016-05-08 DIAGNOSIS — Z85118 Personal history of other malignant neoplasm of bronchus and lung: Secondary | ICD-10-CM | POA: Diagnosis not present

## 2016-11-27 DIAGNOSIS — E7800 Pure hypercholesterolemia, unspecified: Secondary | ICD-10-CM | POA: Diagnosis not present

## 2016-11-27 DIAGNOSIS — M79606 Pain in leg, unspecified: Secondary | ICD-10-CM | POA: Diagnosis not present

## 2016-11-27 DIAGNOSIS — E1165 Type 2 diabetes mellitus with hyperglycemia: Secondary | ICD-10-CM | POA: Diagnosis not present

## 2016-11-27 DIAGNOSIS — Z85118 Personal history of other malignant neoplasm of bronchus and lung: Secondary | ICD-10-CM | POA: Diagnosis not present

## 2016-12-06 ENCOUNTER — Other Ambulatory Visit: Payer: Self-pay | Admitting: Internal Medicine

## 2016-12-06 DIAGNOSIS — M79604 Pain in right leg: Secondary | ICD-10-CM

## 2016-12-06 DIAGNOSIS — R0989 Other specified symptoms and signs involving the circulatory and respiratory systems: Secondary | ICD-10-CM

## 2016-12-06 DIAGNOSIS — M79605 Pain in left leg: Principal | ICD-10-CM

## 2016-12-11 ENCOUNTER — Ambulatory Visit
Admission: RE | Admit: 2016-12-11 | Discharge: 2016-12-11 | Disposition: A | Discharge status: Home / Self Care | Payer: Medicaid Other | Source: Ambulatory Visit | Attending: Internal Medicine | Admitting: Internal Medicine

## 2016-12-11 DIAGNOSIS — M79605 Pain in left leg: Principal | ICD-10-CM

## 2016-12-11 DIAGNOSIS — R0989 Other specified symptoms and signs involving the circulatory and respiratory systems: Secondary | ICD-10-CM

## 2016-12-11 DIAGNOSIS — M79604 Pain in right leg: Secondary | ICD-10-CM

## 2017-03-16 ENCOUNTER — Inpatient Hospital Stay
Admit: 2017-03-16 | Discharge: 2017-03-16 | Disposition: A | Discharge status: Home / Self Care | Payer: Medicare Other | Attending: Internal Medicine | Admitting: Internal Medicine

## 2017-03-16 ENCOUNTER — Emergency Department: Payer: Medicare Other

## 2017-03-16 ENCOUNTER — Encounter: Payer: Self-pay | Admitting: Emergency Medicine

## 2017-03-16 ENCOUNTER — Encounter
Admission: EM | Disposition: A | Discharge status: Other Acute Inpatient Hospital | Payer: Self-pay | Source: Home / Self Care | Attending: Specialist

## 2017-03-16 ENCOUNTER — Inpatient Hospital Stay
Admission: EM | Admit: 2017-03-16 | Discharge: 2017-03-17 | DRG: 280 | Disposition: A | Discharge status: Other Acute Inpatient Hospital | Payer: Medicare Other | Attending: Specialist | Admitting: Specialist

## 2017-03-16 DIAGNOSIS — Z9221 Personal history of antineoplastic chemotherapy: Secondary | ICD-10-CM | POA: Diagnosis not present

## 2017-03-16 DIAGNOSIS — I214 Non-ST elevation (NSTEMI) myocardial infarction: Principal | ICD-10-CM | POA: Diagnosis present

## 2017-03-16 DIAGNOSIS — Z923 Personal history of irradiation: Secondary | ICD-10-CM

## 2017-03-16 DIAGNOSIS — Z9114 Patient's other noncompliance with medication regimen: Secondary | ICD-10-CM

## 2017-03-16 DIAGNOSIS — Z87891 Personal history of nicotine dependence: Secondary | ICD-10-CM | POA: Diagnosis not present

## 2017-03-16 DIAGNOSIS — Z7984 Long term (current) use of oral hypoglycemic drugs: Secondary | ICD-10-CM

## 2017-03-16 DIAGNOSIS — E119 Type 2 diabetes mellitus without complications: Secondary | ICD-10-CM | POA: Diagnosis present

## 2017-03-16 DIAGNOSIS — I10 Essential (primary) hypertension: Secondary | ICD-10-CM | POA: Diagnosis present

## 2017-03-16 DIAGNOSIS — E785 Hyperlipidemia, unspecified: Secondary | ICD-10-CM | POA: Diagnosis present

## 2017-03-16 DIAGNOSIS — J449 Chronic obstructive pulmonary disease, unspecified: Secondary | ICD-10-CM | POA: Diagnosis present

## 2017-03-16 DIAGNOSIS — I2510 Atherosclerotic heart disease of native coronary artery without angina pectoris: Secondary | ICD-10-CM | POA: Diagnosis present

## 2017-03-16 DIAGNOSIS — E039 Hypothyroidism, unspecified: Secondary | ICD-10-CM | POA: Diagnosis present

## 2017-03-16 DIAGNOSIS — I420 Dilated cardiomyopathy: Secondary | ICD-10-CM | POA: Diagnosis present

## 2017-03-16 DIAGNOSIS — Z85118 Personal history of other malignant neoplasm of bronchus and lung: Secondary | ICD-10-CM | POA: Diagnosis not present

## 2017-03-16 DIAGNOSIS — E1140 Type 2 diabetes mellitus with diabetic neuropathy, unspecified: Secondary | ICD-10-CM

## 2017-03-16 DIAGNOSIS — J9601 Acute respiratory failure with hypoxia: Secondary | ICD-10-CM | POA: Diagnosis present

## 2017-03-16 HISTORY — DX: Non-ST elevation (NSTEMI) myocardial infarction: I21.4

## 2017-03-16 HISTORY — PX: LEFT HEART CATH AND CORONARY ANGIOGRAPHY: CATH118249

## 2017-03-16 LAB — PROTIME-INR
INR: 0.98
Prothrombin Time: 13 seconds (ref 11.4–15.2)

## 2017-03-16 LAB — COMPREHENSIVE METABOLIC PANEL
ALT: 29 U/L (ref 17–63)
ANION GAP: 11 (ref 5–15)
AST: 32 U/L (ref 15–41)
Albumin: 4.1 g/dL (ref 3.5–5.0)
Alkaline Phosphatase: 39 U/L (ref 38–126)
BUN: 22 mg/dL — ABNORMAL HIGH (ref 6–20)
CHLORIDE: 101 mmol/L (ref 101–111)
CO2: 25 mmol/L (ref 22–32)
CREATININE: 0.89 mg/dL (ref 0.61–1.24)
Calcium: 9 mg/dL (ref 8.9–10.3)
Glucose, Bld: 290 mg/dL — ABNORMAL HIGH (ref 65–99)
POTASSIUM: 4.3 mmol/L (ref 3.5–5.1)
Sodium: 137 mmol/L (ref 135–145)
Total Bilirubin: 0.9 mg/dL (ref 0.3–1.2)
Total Protein: 7.3 g/dL (ref 6.5–8.1)

## 2017-03-16 LAB — GLUCOSE, CAPILLARY
GLUCOSE-CAPILLARY: 252 mg/dL — AB (ref 65–99)
GLUCOSE-CAPILLARY: 205 mg/dL — AB (ref 65–99)
GLUCOSE-CAPILLARY: 130 mg/dL — AB (ref 65–99)
GLUCOSE-CAPILLARY: 224 mg/dL — AB (ref 65–99)
Glucose-Capillary: 307 mg/dL — ABNORMAL HIGH (ref 65–99)

## 2017-03-16 LAB — CBC
HCT: 44 % (ref 40.0–52.0)
Hemoglobin: 14.5 g/dL (ref 13.0–18.0)
MCH: 28.9 pg (ref 26.0–34.0)
MCHC: 33 g/dL (ref 32.0–36.0)
MCV: 87.6 fL (ref 80.0–100.0)
PLATELETS: 177 10*3/uL (ref 150–440)
RBC: 5.03 MIL/uL (ref 4.40–5.90)
RDW: 14.4 % (ref 11.5–14.5)
WBC: 10.4 10*3/uL (ref 3.8–10.6)

## 2017-03-16 LAB — TROPONIN I
TROPONIN I: 0.47 ng/mL — AB (ref <0.03)
Troponin I: 0.82 ng/mL (ref <0.03)
Troponin I: 1.64 ng/mL (ref <0.03)
Troponin I: 1.92 ng/mL (ref <0.03)

## 2017-03-16 LAB — ECHOCARDIOGRAM COMPLETE
HEIGHTINCHES: 68 in
WEIGHTICAEL: 2800 [oz_av]

## 2017-03-16 LAB — MRSA PCR SCREENING: MRSA BY PCR: NEGATIVE

## 2017-03-16 LAB — HEPARIN LEVEL (UNFRACTIONATED)
HEPARIN UNFRACTIONATED: 0.5 [IU]/mL (ref 0.30–0.70)
Heparin Unfractionated: 0.29 IU/mL — ABNORMAL LOW (ref 0.30–0.70)

## 2017-03-16 LAB — BRAIN NATRIURETIC PEPTIDE: B NATRIURETIC PEPTIDE 5: 240 pg/mL — AB (ref 0.0–100.0)

## 2017-03-16 LAB — APTT: aPTT: 31 seconds (ref 24–36)

## 2017-03-16 SURGERY — LEFT HEART CATH AND CORONARY ANGIOGRAPHY
Anesthesia: Moderate Sedation | Laterality: Left

## 2017-03-16 MED ORDER — ONDANSETRON HCL 4 MG/2ML IJ SOLN: 4.0000 mg | Freq: Four times a day (QID) | INTRAMUSCULAR | Status: DC | PRN

## 2017-03-16 MED ORDER — FENTANYL CITRATE (PF) 100 MCG/2ML IJ SOLN
INTRAMUSCULAR | Status: AC
  Filled 2017-03-16: qty 2

## 2017-03-16 MED ORDER — IPRATROPIUM-ALBUTEROL 0.5-2.5 (3) MG/3ML IN SOLN
3.0000 mL | Freq: Once | RESPIRATORY_TRACT | Status: AC
  Administered 2017-03-16: 3 mL via RESPIRATORY_TRACT
  Filled 2017-03-16: qty 3

## 2017-03-16 MED ORDER — IOPAMIDOL (ISOVUE-300) INJECTION 61%
INTRAVENOUS | Status: DC | PRN
  Administered 2017-03-16: 110 mL via INTRA_ARTERIAL

## 2017-03-16 MED ORDER — ASPIRIN 81 MG PO CHEW
324.0000 mg | CHEWABLE_TABLET | ORAL | Status: AC
  Administered 2017-03-16: 324 mg via ORAL
  Filled 2017-03-16: qty 4

## 2017-03-16 MED ORDER — HEPARIN BOLUS VIA INFUSION
1100.0000 [IU] | Freq: Once | INTRAVENOUS | Status: AC
  Administered 2017-03-16: 1100 [IU] via INTRAVENOUS
  Filled 2017-03-16: qty 1100

## 2017-03-16 MED ORDER — SODIUM CHLORIDE 0.9% FLUSH: 3.0000 mL | INTRAVENOUS | Status: DC | PRN

## 2017-03-16 MED ORDER — HEPARIN (PORCINE) IN NACL 100-0.45 UNIT/ML-% IJ SOLN
1100.0000 [IU]/h | INTRAMUSCULAR | Status: DC
  Administered 2017-03-16: 1100 [IU]/h via INTRAVENOUS
  Filled 2017-03-16: qty 250

## 2017-03-16 MED ORDER — SODIUM CHLORIDE 0.9 % WEIGHT BASED INFUSION
1.0000 mL/kg/h | INTRAVENOUS | Status: DC
  Administered 2017-03-16: 1 mL/kg/h via INTRAVENOUS

## 2017-03-16 MED ORDER — ATORVASTATIN CALCIUM 20 MG PO TABS
20.0000 mg | ORAL_TABLET | Freq: Every day | ORAL | Status: DC
  Administered 2017-03-16: 20 mg via ORAL
  Filled 2017-03-16: qty 1

## 2017-03-16 MED ORDER — SODIUM CHLORIDE 0.9 % IV SOLN: 250.0000 mL | INTRAVENOUS | Status: DC | PRN

## 2017-03-16 MED ORDER — INSULIN ASPART 100 UNIT/ML ~~LOC~~ SOLN
0.0000 [IU] | SUBCUTANEOUS | Status: DC
  Administered 2017-03-16: 5 [IU] via SUBCUTANEOUS
  Administered 2017-03-16: 2 [IU] via SUBCUTANEOUS
  Administered 2017-03-16: 5 [IU] via SUBCUTANEOUS
  Administered 2017-03-17: 2 [IU] via SUBCUTANEOUS
  Administered 2017-03-17 (×2): 3 [IU] via SUBCUTANEOUS
  Filled 2017-03-16 (×6): qty 1

## 2017-03-16 MED ORDER — MIDAZOLAM HCL 2 MG/2ML IJ SOLN
INTRAMUSCULAR | Status: AC
  Filled 2017-03-16: qty 2

## 2017-03-16 MED ORDER — ASPIRIN EC 81 MG PO TBEC
81.0000 mg | DELAYED_RELEASE_TABLET | Freq: Every day | ORAL | Status: DC
  Administered 2017-03-17: 81 mg via ORAL
  Filled 2017-03-16: qty 1

## 2017-03-16 MED ORDER — ACETAMINOPHEN 325 MG PO TABS: 650.0000 mg | ORAL_TABLET | ORAL | Status: DC | PRN

## 2017-03-16 MED ORDER — SODIUM CHLORIDE 0.9% FLUSH
3.0000 mL | Freq: Two times a day (BID) | INTRAVENOUS | Status: DC
  Administered 2017-03-16: 3 mL via INTRAVENOUS

## 2017-03-16 MED ORDER — MIDAZOLAM HCL 2 MG/2ML IJ SOLN
INTRAMUSCULAR | Status: DC | PRN
  Administered 2017-03-16: 1 mg via INTRAVENOUS

## 2017-03-16 MED ORDER — SODIUM CHLORIDE 0.9% FLUSH
3.0000 mL | Freq: Two times a day (BID) | INTRAVENOUS | Status: DC
  Administered 2017-03-17 (×2): 3 mL via INTRAVENOUS

## 2017-03-16 MED ORDER — FENTANYL CITRATE (PF) 100 MCG/2ML IJ SOLN
INTRAMUSCULAR | Status: DC | PRN
  Administered 2017-03-16: 50 ug via INTRAVENOUS

## 2017-03-16 MED ORDER — NITROGLYCERIN 0.4 MG SL SUBL: 0.4000 mg | SUBLINGUAL_TABLET | SUBLINGUAL | Status: DC | PRN

## 2017-03-16 MED ORDER — HEPARIN (PORCINE) IN NACL 100-0.45 UNIT/ML-% IJ SOLN
1100.0000 [IU]/h | INTRAMUSCULAR | Status: DC
  Administered 2017-03-16: 950 [IU]/h via INTRAVENOUS
  Filled 2017-03-16: qty 250

## 2017-03-16 MED ORDER — SODIUM CHLORIDE 0.9 % WEIGHT BASED INFUSION: 3.0000 mL/kg/h | INTRAVENOUS | Status: DC

## 2017-03-16 MED ORDER — SODIUM CHLORIDE 0.9 % WEIGHT BASED INFUSION: 1.0000 mL/kg/h | INTRAVENOUS | Status: DC

## 2017-03-16 MED ORDER — HEPARIN BOLUS VIA INFUSION
4000.0000 [IU] | Freq: Once | INTRAVENOUS | Status: AC
  Administered 2017-03-16: 4000 [IU] via INTRAVENOUS
  Filled 2017-03-16: qty 4000

## 2017-03-16 MED ORDER — CARVEDILOL 3.125 MG PO TABS
3.1250 mg | ORAL_TABLET | Freq: Two times a day (BID) | ORAL | Status: DC
  Administered 2017-03-16 – 2017-03-17 (×3): 3.125 mg via ORAL
  Filled 2017-03-16 (×3): qty 1

## 2017-03-16 MED ORDER — HEPARIN (PORCINE) IN NACL 2-0.9 UNIT/ML-% IJ SOLN
INTRAMUSCULAR | Status: AC
  Filled 2017-03-16: qty 500

## 2017-03-16 MED ORDER — ASPIRIN 300 MG RE SUPP: 300.0000 mg | RECTAL | Status: AC

## 2017-03-16 MED ORDER — ASPIRIN 81 MG PO CHEW
324.0000 mg | CHEWABLE_TABLET | Freq: Once | ORAL | Status: AC
  Administered 2017-03-16: 324 mg via ORAL
  Filled 2017-03-16: qty 4

## 2017-03-16 SURGICAL SUPPLY — 9 items
CATH 5FR JR4 DIAGNOSTIC (CATHETERS) ×3 IMPLANT
CATH 5FR PIGTAIL DIAGNOSTIC (CATHETERS) ×3 IMPLANT
CATH INFINITI 5FR JL4 (CATHETERS) ×3 IMPLANT
DEVICE CLOSURE MYNXGRIP 5F (Vascular Products) ×3 IMPLANT
KIT MANI 3VAL PERCEP (MISCELLANEOUS) ×3 IMPLANT
NEEDLE PERC 18GX7CM (NEEDLE) ×3 IMPLANT
PACK CARDIAC CATH (CUSTOM PROCEDURE TRAY) ×3 IMPLANT
SHEATH AVANTI 5FR X 11CM (SHEATH) ×3 IMPLANT
WIRE EMERALD 3MM-J .035X150CM (WIRE) ×3 IMPLANT

## 2017-03-16 NOTE — Discharge Instructions (Signed)
Moderate Conscious Sedation, Adult, Care After These instructions provide you with information about caring for yourself after your procedure. Your health care provider may also give you more specific instructions. Your treatment has been planned according to current medical practices, but problems sometimes occur. Call your health care provider if you have any problems or questions after your procedure. What can I expect after the procedure? After your procedure, it is common:  To feel sleepy for several hours.  To feel clumsy and have poor balance for several hours.  To have poor judgment for several hours.  To vomit if you eat too soon.  Follow these instructions at home: For at least 24 hours after the procedure:   Do not: ? Participate in activities where you could fall or become injured. ? Drive. ? Use heavy machinery. ? Drink alcohol. ? Take sleeping pills or medicines that cause drowsiness. ? Make important decisions or sign legal documents. ? Take care of children on your own.  Rest. Eating and drinking  Follow the diet recommended by your health care provider.  If you vomit: ? Drink water, juice, or soup when you can drink without vomiting. ? Make sure you have little or no nausea before eating solid foods. General instructions  Have a responsible adult stay with you until you are awake and alert.  Take over-the-counter and prescription medicines only as told by your health care provider.  If you smoke, do not smoke without supervision.  Keep all follow-up visits as told by your health care provider. This is important. Contact a health care provider if:  You keep feeling nauseous or you keep vomiting.  You feel light-headed.  You develop a rash.  You have a fever. Get help right away if:  You have trouble breathing. This information is not intended to replace advice given to you by your health care provider. Make sure you discuss any questions you have  with your health care provider. Document Released: 06/04/2013 Document Revised: 01/17/2016 Document Reviewed: 12/04/2015 Elsevier Interactive Patient Education  2018 [employer]. Angioedema Angioedema is sudden swelling in the body. The swelling can happen in any part of the body. It often happens on the skin and causes itchy, bumpy patches (hives) to form. This condition may:  Happen only one time.  Happen more than one time. It may come back at random times.  Keep coming back for a number of years. Someday it may stop coming back.  Follow these instructions at home:  Take over-the-counter and prescription medicines only as told by your doctor.  If you were given medicines for emergency allergy treatment, always carry them with you.  Wear a medical bracelet as told by your doctor.  Avoid the things that cause your attacks (triggers).  If this condition was passed to you from your parents and you want to have kids, talk to your doctor. Your kids may also have this condition. Contact a doctor if:  You have another attack.  Your attacks happen more often, even after you take steps to prevent them.  This condition was passed to you by your parents and you want to have kids. Get help right away if:  Your mouth, tongue, or lips get very swollen.  You have trouble breathing.  You have trouble swallowing.  You pass out (faint). This information is not intended to replace advice given to you by your health care provider. Make sure you discuss any questions you have with your health care provider. Document Released:  08/02/2009 Document Revised: 03/15/2016 Document Reviewed: 02/22/2016 Elsevier Interactive Patient Education  2017 [employer].

## 2017-03-16 NOTE — Progress Notes (Signed)
ANTICOAGULATION CONSULT NOTE - Initial Consult  Pharmacy Consult for heparin drip Indication: chest pain/ACS  No Known Allergies  Patient Measurements: Height: 5\' 8"  (172.7 cm) Weight: 167 lb (75.8 kg) IBW/kg (Calculated) : 68.4 Heparin Dosing Weight: 79.4 kg  Vital Signs: Temp: 98.2 F (36.8 C) (07/20 1709) Temp Source: Oral (07/20 1709) BP: 99/67 (07/20 1709) Pulse Rate: 96 (07/20 1709)  Labs:  Recent Labs  03/16/17 0450 03/16/17 0453 03/16/17 0842 03/16/17 1149 03/16/17 1426  HGB 14.5  --   --   --   --   HCT 44.0  --   --   --   --   PLT 177  --   --   --   --   APTT  --  31  --   --   --   LABPROT  --  13.0  --   --   --   INR  --  0.98  --   --   --   HEPARINUNFRC  --   --   --  0.29*  --   CREATININE 0.89  --   --   --   --   TROPONINI 0.47*  --  0.82*  --  1.64*    Estimated Creatinine Clearance: 89.7 mL/min (by C-G formula based on SCr of 0.89 mg/dL).   Medical History: Past Medical History:  Diagnosis Date  . Acquired hypothyroidism 10/05/2015  . Constipation   . Diabetes mellitus    metfomin  . ED (erectile dysfunction)   . Hepatic steatosis 07/26/11   severe ct chest  . Hyperlipidemia   . Hypertension   . Lung cancer ([place])    lung ca dx5/02/2011  . Neuropathy of finger    mild s/p chemotherapy  . Pneumonia    hx    Medications:  Scheduled:  . [START ON 03/17/2017] aspirin EC  81 mg Oral Daily  . atorvastatin  20 mg Oral q1800  . carvedilol  3.125 mg Oral BID WC  . insulin aspart  0-15 Units Subcutaneous Q4H  . [START ON 03/17/2017] sodium chloride flush  3 mL Intravenous Q12H    Assessment: Patient underwent cardiac cath today with recommendations for CABG per procedure note. Patient was on heparin drip at a rate of 1100 units/hr prior to cath today. Heparin not infusing when patient transferred to 2A - confirmed with RN.  Goal of Therapy:  Heparin level 0.3-0.7 units/ml Monitor platelets by anticoagulation protocol: Yes   Plan:   Spoke with Dr. Saralyn Pilar - will resume heparin at previous rate of 1100 units/hr per MD.  Will check HL in 6 hours and CBC with AM labs tomorrow.  Lenis Noon, PharmD, BCPS Clinical Pharmacist  03/16/2017

## 2017-03-16 NOTE — Progress Notes (Signed)
ANTICOAGULATION CONSULT NOTE - Initial Consult  Pharmacy Consult for heparin drip Indication: chest pain/ACS  No Known Allergies  Patient Measurements: Height: 5\' 8"  (172.7 cm) Weight: 167 lb 1.7 oz (75.8 kg) IBW/kg (Calculated) : 68.4 Heparin Dosing Weight: 79.4 kg  Vital Signs: Temp: 98.1 F (36.7 C) (07/20 0800) Temp Source: Oral (07/20 0800) BP: 101/75 (07/20 1200) Pulse Rate: 110 (07/20 1200)  Labs:  Recent Labs  03/16/17 0450 03/16/17 0453 03/16/17 0842 03/16/17 1149  HGB 14.5  --   --   --   HCT 44.0  --   --   --   PLT 177  --   --   --   APTT  --  31  --   --   LABPROT  --  13.0  --   --   INR  --  0.98  --   --   HEPARINUNFRC  --   --   --  0.29*  CREATININE 0.89  --   --   --   TROPONINI 0.47*  --  0.82*  --     Estimated Creatinine Clearance: 89.7 mL/min (by C-G formula based on SCr of 0.89 mg/dL).   Medical History: Past Medical History:  Diagnosis Date  . Acquired hypothyroidism 10/05/2015  . Constipation   . Diabetes mellitus    metfomin  . ED (erectile dysfunction)   . Hepatic steatosis 07/26/11   severe ct chest  . Homeless 04/06/2014  . Hyperlipidemia   . Hypertension   . Lung cancer ([place])    lung ca dx5/02/2011  . Neuropathy of finger    mild s/p chemotherapy  . Pneumonia    hx    Medications:  Scheduled:  . [START ON 03/17/2017] aspirin EC  81 mg Oral Daily  . atorvastatin  20 mg Oral q1800  . carvedilol  3.125 mg Oral BID WC  . heparin  1,100 Units Intravenous Once  . insulin aspart  0-15 Units Subcutaneous Q4H  . sodium chloride flush  3 mL Intravenous Q12H    Assessment: Patient admitted w/ SOB w/ h/o lung cancer is being started on a heparin drip.  Goal of Therapy:  Heparin level 0.3-0.7 units/ml Monitor platelets by anticoagulation protocol: Yes   Plan:  Will bolus heparin 1100 units x 1 and increase infusion to 1100 units/hr. Will f/u after cath.  Ulice Dash, PharmD Clinical Pharmacist  03/16/2017

## 2017-03-16 NOTE — H&P (Addendum)
[facility] [place] NAME: George Griffin    MR#:  [id number]  DATE OF BIRTH:  June 22, 1961  DATE OF ADMISSION:  03/16/2017  PRIMARY CARE PHYSICIAN: Jilda Panda, MD   REQUESTING/REFERRING PHYSICIAN:   CHIEF COMPLAINT:   Chief Complaint  Patient presents with  . Shortness of Breath    HISTORY OF PRESENT ILLNESS: George Griffin  is a 56 y.o. male with a known history of Lung cancer status post chemotherapy and radiation therapy, diabetes mellitus type 2, hypertension, hyperlipidemia presented to the emergency room with shortness of breath. Patient also complaints of squeezing chest pain for the last 1 day. He initially had oxygen saturation of 84% on room air when he presented to the emergency room and was put on BiPAP and stabilized in the emergency room. Chest pain was sharp in nature located in the retrosternal area. Patient was worked up and his troponin was elevated 0.47. Patient was started on heparin drip for non-ST elevation MI. Patient's blood pressure was also elevated when he presented to the emergency room and he was put on Nitropaste to control the pressure.Patient currently quit smoking. The shortness of breath gets worsened on exertion. Hospitalist service was consulted for the care of the patient.  PAST MEDICAL HISTORY:   Past Medical History:  Diagnosis Date  . Acquired hypothyroidism 10/05/2015  . Constipation   . Diabetes mellitus    metfomin  . ED (erectile dysfunction)   . Hepatic steatosis 07/26/11   severe ct chest  . Homeless 04/06/2014  . Hyperlipidemia   . Hypertension   . Lung cancer ([place])    lung ca dx5/02/2011  . Neuropathy of finger    mild s/p chemotherapy  . Pneumonia    hx    PAST SURGICAL HISTORY: Past Surgical History:  Procedure Laterality Date  . none      SOCIAL HISTORY:  Social History  Substance Use Topics  . Smoking status: Former Research scientist (life sciences)  . Smokeless tobacco: Never Used  . Alcohol use No     FAMILY HISTORY:  Family History  Problem Relation Age of Onset  . CAD Neg Hx   . Hypertension Neg Hx   . Diabetes Neg Hx     DRUG ALLERGIES: No Known Allergies  REVIEW OF SYSTEMS:   CONSTITUTIONAL: No fever, has weakness.  EYES: No blurred or double vision.  EARS, NOSE, AND THROAT: No tinnitus or ear pain.  RESPIRATORY: No cough, has shortness of breath,  No wheezing or hemoptysis.  CARDIOVASCULAR: Has chest pain,  No orthopnea, edema.  GASTROINTESTINAL: No nausea, vomiting, diarrhea or abdominal pain.  GENITOURINARY: No dysuria, hematuria.  ENDOCRINE: No polyuria, nocturia,  HEMATOLOGY: No anemia, easy bruising or bleeding SKIN: No rash or lesion. MUSCULOSKELETAL: No joint pain or arthritis.   NEUROLOGIC: No tingling, numbness, weakness.  PSYCHIATRY: No anxiety or depression.   MEDICATIONS AT HOME:  Prior to Admission medications   Medication Sig Start Date End Date Taking? Authorizing Provider  acetaminophen (TYLENOL) 325 MG tablet Take 650 mg by mouth every 6 (six) hours as needed.      [provider]  metformin (FORTAMET) 1000 MG (OSM) 24 hr tablet Take 1 tablet by mouth Daily. 02/03/12   [provider]  sitaGLIPtan-metformin (JANUMET) 50-1000 MG per tablet Take 1 tablet by mouth 3 (three) times daily before meals.     [provider]      PHYSICAL EXAMINATION:   VITAL SIGNS: Blood pressure 123/86,  pulse (!) 119, temperature (!) 97.5 F (36.4 C), temperature source Oral, resp. rate 17, height 5\' 8"  (1.727 m), weight 79.4 kg (175 lb), SpO2 99 %.  GENERAL:  56 y.o.-year-old patient lying in the bed with no acute distress.  EYES: Pupils equal, round, reactive to light and accommodation. No scleral icterus. Extraocular muscles intact.  HEENT: Head atraumatic, normocephalic. Oropharynx and nasopharynx clear.  NECK:  Supple, no jugular venous distention. No thyroid enlargement, no tenderness.  LUNGS: Decreased breath sounds bilaterally, no  wheezing, rales,rhonchi or crepitation. No use of accessory muscles of respiration.  CARDIOVASCULAR: S1, S2 normal. No murmurs, rubs, or gallops.  ABDOMEN: Soft, nontender, nondistended. Bowel sounds present. No organomegaly or mass.  EXTREMITIES: No pedal edema, cyanosis, or clubbing.  NEUROLOGIC: Cranial nerves II through XII are intact. Muscle strength 5/5 in all extremities. Sensation intact. Gait not checked.  PSYCHIATRIC: The patient is alert and oriented x 3.  SKIN: No obvious rash, lesion, or ulcer.   LABORATORY PANEL:   CBC  Recent Labs Lab 03/16/17 0450  WBC 10.4  HGB 14.5  HCT 44.0  PLT 177  MCV 87.6  MCH 28.9  MCHC 33.0  RDW 14.4   ------------------------------------------------------------------------------------------------------------------  Chemistries   Recent Labs Lab 03/16/17 0450  NA 137  K 4.3  CL 101  CO2 25  GLUCOSE 290*  BUN 22*  CREATININE 0.89  CALCIUM 9.0  AST 32  ALT 29  ALKPHOS 39  BILITOT 0.9   ------------------------------------------------------------------------------------------------------------------ estimated creatinine clearance is 89.7 mL/min (by C-G formula based on SCr of 0.89 mg/dL). ------------------------------------------------------------------------------------------------------------------ No results for input(s): TSH, T4TOTAL, T3FREE, THYROIDAB in the last 72 hours.  Invalid input(s): FREET3   Coagulation profile  Recent Labs Lab 03/16/17 0453  INR 0.98   ------------------------------------------------------------------------------------------------------------------- No results for input(s): DDIMER in the last 72 hours. -------------------------------------------------------------------------------------------------------------------  Cardiac Enzymes  Recent Labs Lab 03/16/17 0450  TROPONINI 0.47*    ------------------------------------------------------------------------------------------------------------------ Invalid input(s): POCBNP  ---------------------------------------------------------------------------------------------------------------  Urinalysis    Component Value Date/Time   COLORURINE YELLOW 01/25/2011 1107   APPEARANCEUR CLEAR 01/25/2011 1107   LABSPEC 1.020 01/25/2011 1107   PHURINE 7.5 01/25/2011 1107   GLUCOSEU >1000 (A) 01/25/2011 1107   HGBUR NEGATIVE 01/25/2011 1107   BILIRUBINUR NEGATIVE 01/25/2011 1107   KETONESUR NEGATIVE 01/25/2011 1107   PROTEINUR NEGATIVE 01/25/2011 1107   UROBILINOGEN 0.2 01/25/2011 1107   NITRITE NEGATIVE 01/25/2011 1107   LEUKOCYTESUR NEGATIVE 01/25/2011 1107     RADIOLOGY: Dg Chest Portable 1 View  Result Date: 03/16/2017 CLINICAL DATA:  Shortness of breath and history of lung cancer EXAM: PORTABLE CHEST 1 VIEW COMPARISON:  Chest CT 04/17/2016 FINDINGS: Right parahilar mass and streaky associated opacities are similar to the appearance on the chest CT of 04/17/2016. There is no other area of focal consolidation. No pulmonary edema. No pneumothorax or pleural effusion. IMPRESSION: Right parahilar mass and associated linear opacities are likely unchanged compared to the 04/17/2016 chest CT when allowing for differences in modality, though it would be difficult to entirely exclude the possibility of superimposed infection. Electronically Signed   By: Ulyses Jarred M.D.   On: 03/16/2017 05:33    EKG: Orders placed or performed during the hospital encounter of 03/16/17  . EKG 12-Lead  . EKG 12-Lead  . EKG 12-Lead  . EKG 12-Lead    IMPRESSION AND PLAN: 56 year old male patient with history of diabetes mellitus type 2, hypertension, hyperlipidemia, lung cancer presented to the emergency room with shortness of breath and chest discomfort. Admitting  diagnosis : 1. Non-ST elevation myocardial infarction 2. Acute respiratory  failure with Hypoxia 3. Hypertension 4. Hyperlipidemia 5. Type 2 diabetes mellitus 6. Lung cancer Treatment plan : Admit patient to stepdown unit Continue BiPAP for hypoxic respiratory failure IV heparin drip for anticoagulation Aspirin 81 mg daily Cardiology consultation Echocardiogram Cycle troponin Start patient on beta blocker and statin medication Check lipid profile  All the records are reviewed and case discussed with ED provider. Management plans discussed with the patient, family and they are in agreement.  CODE STATUS:FULL CODE Code Status History    This patient does not have a recorded code status. Please follow your organizational policy for patients in this situation.       TOTAL CRITICAL CARE TIME TAKING CARE OF THIS PATIENT: 55 minutes.    Saundra Shelling M.D on 03/16/2017 at 6:17 AM  Between 7am to 6pm - Pager - [phone]  After 6pm go to [email or web address] - password [email or web address] [facility]  [facility]  Office  [phone]  CC: Primary care physician; Jilda Panda, MD

## 2017-03-16 NOTE — Consult Note (Signed)
[facility] Cardiology  CARDIOLOGY CONSULT NOTE  Patient ID: George Griffin MRN: [id number] DOB/AGE: [birth year] 56 y.o.  Admit date: 03/16/2017 Referring Physician Verdell Carmine Primary Physician [facility] Primary Cardiologist  Reason for Consultation Non-ST elevation myocardial infarction  HPI: 56 year old gentleman referred for evaluation of chest pain and elevated troponin consistent with non-ST elevation myocardial infarction. She has known multiple cardiovascular risk factors including hypertension, hyperlipidemia and type 2 diabetes. He is status post chemotherapy and radiation therapy for lung cancer 2012. The patient presents to [facility] emergency room with one-day history of squeezing chest pain and hypoxia. ECG was nondiagnostic. She'll labs were notable for elevated troponin of 0.47. The patient was treated with heparin drip and Nitropaste.  Review of systems complete and found to be negative unless listed above     Past Medical History:  Diagnosis Date  . Acquired hypothyroidism 10/05/2015  . Constipation   . Diabetes mellitus    metfomin  . ED (erectile dysfunction)   . Hepatic steatosis 07/26/11   severe ct chest  . Homeless 04/06/2014  . Hyperlipidemia   . Hypertension   . Lung cancer ([place])    lung ca dx5/02/2011  . Neuropathy of finger    mild s/p chemotherapy  . Pneumonia    hx    Past Surgical History:  Procedure Laterality Date  . none      Prescriptions Prior to Admission  Medication Sig Dispense Refill Last Dose  . acetaminophen (TYLENOL) 325 MG tablet Take 650 mg by mouth every 6 (six) hours as needed.     Taking  . metformin (FORTAMET) 1000 MG (OSM) 24 hr tablet Take 1 tablet by mouth Daily.   Taking  . sitaGLIPtan-metformin (JANUMET) 50-1000 MG per tablet Take 1 tablet by mouth 3 (three) times daily before meals.    Taking   Social History   Social History  . Marital status: Married    Spouse name: N/A  . Number of children: N/A  . Years of education: N/A    Occupational History  .  Unemployed   Social History Main Topics  . Smoking status: Former Research scientist (life sciences)  . Smokeless tobacco: Never Used  . Alcohol use No  . Drug use: No  . Sexual activity: Not on file   Other Topics Concern  . Not on file   Social History Narrative  . No narrative on file    Family History  Problem Relation Age of Onset  . CAD Neg Hx   . Hypertension Neg Hx   . Diabetes Neg Hx       Review of systems complete and found to be negative unless listed above      PHYSICAL EXAM  General: Well developed, well nourished, in no acute distress HEENT:  Normocephalic and atramatic Neck:  No JVD.  Lungs: Clear bilaterally to auscultation and percussion. Heart: HRRR . Normal S1 and S2 without gallops or murmurs.  Abdomen: Bowel sounds are positive, abdomen soft and non-tender  Msk:  Back normal, normal gait. Normal strength and tone for age. Extremities: No clubbing, cyanosis or edema.   Neuro: Alert and oriented X 3. Psych:  Good affect, responds appropriately  Labs:   Lab Results  Component Value Date   WBC 10.4 03/16/2017   HGB 14.5 03/16/2017   HCT 44.0 03/16/2017   MCV 87.6 03/16/2017   PLT 177 03/16/2017    Recent Labs Lab 03/16/17 0450  NA 137  K 4.3  CL 101  CO2 25  BUN 22*  CREATININE 0.89  CALCIUM 9.0  PROT 7.3  BILITOT 0.9  ALKPHOS 39  ALT 29  AST 32  GLUCOSE 290*   Lab Results  Component Value Date   CKTOTAL 87 01/25/2011   CKMB 1.8 01/25/2011   TROPONINI 0.47 ([place]) 03/16/2017   No results found for: CHOL No results found for: HDL No results found for: LDLCALC No results found for: TRIG No results found for: CHOLHDL No results found for: LDLDIRECT    Radiology: Dg Chest Portable 1 View  Result Date: 03/16/2017 CLINICAL DATA:  Shortness of breath and history of lung cancer EXAM: PORTABLE CHEST 1 VIEW COMPARISON:  Chest CT 04/17/2016 FINDINGS: Right parahilar mass and streaky associated opacities are similar to the  appearance on the chest CT of 04/17/2016. There is no other area of focal consolidation. No pulmonary edema. No pneumothorax or pleural effusion. IMPRESSION: Right parahilar mass and associated linear opacities are likely unchanged compared to the 04/17/2016 chest CT when allowing for differences in modality, though it would be difficult to entirely exclude the possibility of superimposed infection. Electronically Signed   By: Ulyses Jarred M.D.   On: 03/16/2017 05:33    EKG: Normal sinus rhythm without acute ischemic ST-T wave changes  ASSESSMENT AND PLAN:   1. New-onset chest pain, with elevated troponin consistent with non-ST elevation myocardial infarction  Recommendations  1. Agree with current therapy 2. Continue heparin drip 3. Proceed with cardiac catheterization with selective coronary arteriography. The risks, benefits alternatives to cardiac catheterization, and possible percutaneous coronary intervention were explained to the patient and informed written consent was obtained.  Signed: Isaias Cowman MD,PhD, [facility] 03/16/2017, 8:21 AM

## 2017-03-16 NOTE — Care Management Note (Signed)
Case Management Note  Patient Details  Name: George Griffin MRN: [id number] Date of Birth: 1960/09/27  Subjective/Objective:                  RNCM met with patient with assistance from [facility] contracted [place] interpreter services to discuss discharge planning. Patient has very limited [place]. He states he has lost his health insurance. He is not working currently. He states he lives with his wife that is on vacation right now. He states she knows that he is in the hospital. He was seeing a physician in [place] but "doesn't have any insurance so he stopped seeing him".  He will need to be followed for potential MATCH assistance and community resources (Medications Management and [facility]). Cardiac cath pending today.  Action/Plan: RNCM to continue to follow.   Expected Discharge Date:  03/19/17               Expected Discharge Plan:     In-House Referral:     Discharge planning Services  CM Consult, Medication Assistance, [facility]  Post Acute Care Choice:    Choice offered to:  Patient  DME Arranged:    DME Agency:     HH Arranged:    [place] Agency:     Status of Service:  In process, will continue to follow  If discussed at Long Length of Stay Meetings, dates discussed:    Additional Comments:  Marshell Garfinkel, RN 03/16/2017, 12:30 PM

## 2017-03-16 NOTE — ED Provider Notes (Signed)
[facility] Emergency Department Provider Note  Time seen: 4:51 AM  I have reviewed the triage vital signs and the nursing notes.   HISTORY  Chief Complaint Shortness of Breath    HPI George Griffin is a 56 y.o. male with a past medical history of diabetes, hypertension, lung cancer status post chemotherapy and radiation (last chemotherapy was years ago per patient), who presents to the department for shortness breath. According to the patient using a [place] interpreter, the patient began expressing shortness of breath yesterday. He states the shortness of breath worsened overnight and became severe so he called EMS. EMS states initial room air saturation of 84%. The patient was placed on CPAP and given nitroglycerin paste as his blood pressure was greater than 170 and EMS reports rales. Patient denies any chest pain. States moderate shortness of breath. Denies any fever. States he has been coughing.  Past Medical History:  Diagnosis Date  . Acquired hypothyroidism 10/05/2015  . Constipation   . Diabetes mellitus    metfomin  . ED (erectile dysfunction)   . Hepatic steatosis 07/26/11   severe ct chest  . Homeless 04/06/2014  . Hyperlipidemia   . Hypertension   . Lung cancer ([place])    lung ca dx5/02/2011  . Neuropathy of finger    mild s/p chemotherapy  . Pneumonia    hx    Patient Active Problem List   Diagnosis Date Noted  . Chest congestion 04/18/2016  . Abscess of back 03/24/2016  . Acquired hypothyroidism 10/05/2015  . Poor social situation 10/05/2015  . Constipation, acute 10/05/2015  . Weight loss, unintentional 10/05/2015  . Acute thoracic back pain 03/29/2015  . Homeless 04/06/2014  . Dyspnea 04/06/2014  . History of lung cancer 08/08/2011  . Diabetes mellitus 08/03/2011  . Hyperlipidemia   . Diabetic neuropathy, type II diabetes mellitus ([place])   . Hepatic steatosis   . ASVD 10/28/2010  . DM 10/24/2010    No past surgical history on  file.  Prior to Admission medications   Medication Sig Start Date End Date Taking? Authorizing Provider  acetaminophen (TYLENOL) 325 MG tablet Take 650 mg by mouth every 6 (six) hours as needed.      [provider]  metformin (FORTAMET) 1000 MG (OSM) 24 hr tablet Take 1 tablet by mouth Daily. 02/03/12   [provider]  sitaGLIPtan-metformin (JANUMET) 50-1000 MG per tablet Take 1 tablet by mouth 3 (three) times daily before meals.     [provider]    No Known Allergies  No family history on file.  Social History Social History  Substance Use Topics  . Smoking status: Former Research scientist (life sciences)  . Smokeless tobacco: Never Used  . Alcohol use No    Review of Systems Constitutional: Negative for fever Cardiovascular: Negative for chest pain. Respiratory: Positive for shortness breath. Positive for cough. Musculoskeletal: No leg swelling All other ROS negative  ____________________________________________   PHYSICAL EXAM:  VITAL SIGNS: ED Triage Vitals [03/16/17 0450]  Enc Vitals Group     BP      Pulse      Resp      Temp      Temp src      SpO2      Weight      Height      Head Circumference      Peak Flow      Pain Score 0     Pain Loc  Pain Edu?      Excl. in [place]?     Constitutional: Alert and oriented. Mild distress sitting upright in bed with some difficulty breathing. Eyes: Normal exam ENT   Head: Normocephalic and atraumatic.   Mouth/Throat: Mucous membranes are moist. Cardiovascular: Normal rate, regular rhythm. No murmur Respiratory: Moderate tachypnea with mild respiratory distress. Mild rales in all lung fields. Gastrointestinal: Soft and nontender. No distention.   Musculoskeletal: Nontender with normal range of motion in all extremities. No lower extremity tenderness or edema. Neurologic:  Normal speech and language. No gross focal neurologic deficits . Skin:  Skin is warm, dry and intact.  Psychiatric: Mood and affect  are normal. Speech and behavior are normal.   ____________________________________________    EKG  EKG reviewed and interpreted by myself shows sinus tachycardia 129 bpm, narrow QRS, normal axis, normal intervals and nonspecific ST changes.  ____________________________________________    RADIOLOGY  IMPRESSION: Right parahilar mass and associated linear opacities are likely unchanged compared to the 04/17/2016 chest CT when allowing for differences in modality, though it would be difficult to entirely exclude the possibility of superimposed infection.  ____________________________________________   INITIAL IMPRESSION / ASSESSMENT AND PLAN / ED COURSE  Pertinent labs & imaging results that were available during my care of the patient were reviewed by me and considered in my medical decision making (see chart for details).  Patient presents to the emergency department for difficulty breathing which started approximately 12 hours ago per patient and worsened overnight. Patient is hypoxic to 84% on room air per EMS, no home O2 requirement. On my evaluation the patient has diffuse mild rales in all lung fields. He remains quite hypertensive 170/120. We will leave the nitroglycerin paste on the patient's chest, obtain labs, chest x-ray, place on BiPAP and treat with DuoNeb's.    CRITICAL CARE Performed by: Harvest Dark   Total critical care time: 60 minutes  Critical care time was exclusive of separately billable procedures and treating other patients.  Critical care was necessary to treat or prevent imminent or life-threatening deterioration.  Critical care was time spent personally by me on the following activities: development of treatment plan with patient and/or surrogate as well as nursing, discussions with consultants, evaluation of patient's response to treatment, examination of patient, obtaining history from patient or surrogate, ordering and performing treatments  and interventions, ordering and review of laboratory studies, ordering and review of radiographic studies, pulse oximetry and re-evaluation of patient's condition.   Patient's labs have resulted showing elevated troponin 0.47. We will start the patient on a heparin drip. Chest x-ray appears to show pulmonary edema on my evaluation. We will repeat an EKG and continue to closely monitor. Patient denied any chest pain.  Patient is breathing much more comfortably. Blood pressure has decreased 99 systolic. We will take the nitroglycerin paste off the patient. Patient denies any history of MI in the past. Patient will be admitted for further workup and treatment.   ____________________________________________   FINAL CLINICAL IMPRESSION(S) / ED DIAGNOSES  Dyspnea NSTEMI   Harvest Dark, MD 03/16/17 [phone]

## 2017-03-16 NOTE — Progress Notes (Signed)
[facility] [place] NAME: George Griffin    MR#:  [id number]  DATE OF BIRTH:  Jul 12, 1961  SUBJECTIVE:   Patient here due to shortness of breath. Patient seen with the help of a [place] interpreter. Presently still complaining of shortness of breath but no chest pain. We will do an by cardiac markers and plan for cardiac catheterization later today.  REVIEW OF SYSTEMS:    Review of Systems  Constitutional: Negative for chills and fever.  HENT: Negative for congestion and tinnitus.   Eyes: Negative for blurred vision and double vision.  Respiratory: Positive for shortness of breath. Negative for cough and wheezing.   Cardiovascular: Negative for chest pain, orthopnea and PND.  Gastrointestinal: Negative for abdominal pain, diarrhea, nausea and vomiting.  Genitourinary: Negative for dysuria and hematuria.  Neurological: Negative for dizziness, sensory change and focal weakness.  All other systems reviewed and are negative.   Nutrition: NPO for cath Tolerating Diet: No Tolerating PT: Await Eval.   DRUG ALLERGIES:  No Known Allergies  VITALS:  Blood pressure 106/75, pulse (!) 110, temperature 98.2 F (36.8 C), temperature source Oral, resp. rate (!) 26, height 5\' 8"  (1.727 m), weight 75.8 kg (167 lb), SpO2 95 %.  PHYSICAL EXAMINATION:   Physical Exam  GENERAL:  56 y.o.-year-old patient lying in bed in no acute distress.  EYES: Pupils equal, round, reactive to light and accommodation. No scleral icterus. Extraocular muscles intact.  HEENT: Head atraumatic, normocephalic. Oropharynx and nasopharynx clear.  NECK:  Supple, no jugular venous distention. No thyroid enlargement, no tenderness.  LUNGS: Normal breath sounds bilaterally, no wheezing, rales, rhonchi. No use of accessory muscles of respiration.  CARDIOVASCULAR: S1, S2 normal. No murmurs, rubs, or gallops.  ABDOMEN: Soft, nontender, nondistended. Bowel sounds present. No organomegaly  or mass.  EXTREMITIES: No cyanosis, clubbing or edema b/l.    NEUROLOGIC: Cranial nerves II through XII are intact. No focal Motor or sensory deficits b/l.   PSYCHIATRIC: The patient is alert and oriented x 3.  SKIN: No obvious rash, lesion, or ulcer.    LABORATORY PANEL:   CBC  Recent Labs Lab 03/16/17 0450  WBC 10.4  HGB 14.5  HCT 44.0  PLT 177   ------------------------------------------------------------------------------------------------------------------  Chemistries   Recent Labs Lab 03/16/17 0450  NA 137  K 4.3  CL 101  CO2 25  GLUCOSE 290*  BUN 22*  CREATININE 0.89  CALCIUM 9.0  AST 32  ALT 29  ALKPHOS 39  BILITOT 0.9   ------------------------------------------------------------------------------------------------------------------  Cardiac Enzymes  Recent Labs Lab 03/16/17 1426  TROPONINI 1.64*   ------------------------------------------------------------------------------------------------------------------  RADIOLOGY:  Dg Chest Portable 1 View  Result Date: 03/16/2017 CLINICAL DATA:  Shortness of breath and history of lung cancer EXAM: PORTABLE CHEST 1 VIEW COMPARISON:  Chest CT 04/17/2016 FINDINGS: Right parahilar mass and streaky associated opacities are similar to the appearance on the chest CT of 04/17/2016. There is no other area of focal consolidation. No pulmonary edema. No pneumothorax or pleural effusion. IMPRESSION: Right parahilar mass and associated linear opacities are likely unchanged compared to the 04/17/2016 chest CT when allowing for differences in modality, though it would be difficult to entirely exclude the possibility of superimposed infection. Electronically Signed   By: Ulyses Jarred M.D.   On: 03/16/2017 05:33     ASSESSMENT AND PLAN:   56 year old male with past medical history of diabetes, hypertension who has not been compliant with his medications over the past few  months resents to the hospital due to shortness of  breath and has ruled in for a non-ST elevation MI.  1. Non-ST elevation MI-patient ruled in with cardiac markers. - Currently chest pain-free but still complaining of shortness of breath. Continue aspirin, heparin, atorvastatin, Coreg and plan for cardiac catheterization later today as per cardiology. -Await echocardiogram results.  2. Acute respiratory failure with hypoxia-patient was admitted to the intensive care unit that she was initially on BiPAP. Now weaned off BiPAP, continue duo nebs as needed.  3. Diabetes type 2 without complication-continue sliding scale insulin for now.  4. Essential hypertension-continue carvedilol.   All the records are reviewed and case discussed with Care Management/Social Worker. Management plans discussed with the patient, family and they are in agreement.  CODE STATUS: Full  DVT Prophylaxis: Heparin drip  TOTAL TIME TAKING CARE OF THIS PATIENT: 30 minutes.   POSSIBLE D/C IN 1-2 DAYS, DEPENDING ON CLINICAL CONDITION.   Henreitta Leber M.D on 03/16/2017 at 3:45 PM  Between 7am to 6pm - Pager - [phone]  After 6pm go to [email or web address] - Proofreader  [facility]  Office  [phone]  CC: Primary care physician; Jilda Panda, MD

## 2017-03-16 NOTE — ED Notes (Signed)
Pt c/o feeling BG is high, CBG obtained, 307.  Pt states he uses insulin tid, 15 units.  Pt states his BG's are highly variable, running anywhere from 100-300.  Dr. Estanislado Pandy paged, waiting for response

## 2017-03-16 NOTE — Progress Notes (Signed)
Initial Nutrition Assessment  DOCUMENTATION CODES:   Not applicable  INTERVENTION:  1. With diet advancement, provide Glucerna Shake po TID, each supplement provides 220 kcal and 10 grams of protein 2. Magic cup TID with meals, each supplement provides 290 kcal and 9 grams of protein with advancement 3. MVI w/ minerals  NUTRITION DIAGNOSIS:   Malnutrition related to social / environmental circumstances as evidenced by energy intake < 75% for > or equal to 3 months, percent weight loss.  GOAL:   Patient will meet greater than or equal to 90% of their needs  MONITOR:   I & O's, Labs, Diet advancement, Supplement acceptance  REASON FOR ASSESSMENT:   Malnutrition Screening Tool    ASSESSMENT:   56 yo male h/o Lung CA s/p chemo and XRT, diabetes type 2, hepatic steatosis, HTN presented with chest pain, troponin 0.47, NSTEMI, acute resp failure with hypoxia  Discussed with RN, patient lost health insurance, unable to buy insulin, has been eating very little to prevent hyperglycemia, complains of gen weakness Spoke with patient via [place] at bedside. Since losing health insurance and having no insulin, he reports eating 1 small bowl of rice TID, has been without insulin for 3 months, lost 16#/8.7% of his body weight during that time severe for timeframe. No nausea/vomiting, no appetite at the moment. Has not eaten today. No acute complaints. Card cath pending Nutrition-Focused physical exam completed. Findings are no fat depletion, no muscle depletion, and mild edema.  Medications reviewed and include:   Patient has had stable dz since 2015 per Oncology notes Novolog 0-15 units every 4 hours, Labs reviewed:  CBGs: 205, 252, 307 BNP 240, Troponin 0.82  Diet Order:  Diet NPO time specified  Skin:  Reviewed, no issues  Last BM:  03/16/2017 (Type 7)  Height:   Ht Readings from Last 1 Encounters:  03/16/17 5\' 8"  (1.727 m)    Weight:   Wt Readings from Last 1  Encounters:  03/16/17 167 lb (75.8 kg)    Ideal Body Weight:  70 kg  BMI:  Body mass index is 25.39 kg/m.  Estimated Nutritional Needs:   Kcal:  2162-4469 (MSJ x1-1.2)  Protein:  114-130 grams (1.5-1.7g/kg)  Fluid:  1.8-2.0L  EDUCATION NEEDS:   No education needs identified at this time  George Griffin. Plato Alspaugh, MS, RD LDN Inpatient Clinical Dietitian Pager [phone]

## 2017-03-16 NOTE — Consult Note (Signed)
Name: George Griffin MRN: [id number] DOB: 1961/04/21    ADMISSION DATE:  03/16/2017  CONSULTATION DATE: 03/16/17  REFERRING MD :  Dr. Estanislado Pandy  CHIEF COMPLAINT:  Shortness of breath  BRIEF PATIENT DESCRIPTION: 56 year old with hx of Lung cancer , now presenting with shortness of breath and chest pain requiring BiPAP  SIGNIFICANT EVENTS  7/20 Patient admitted to the stepdown on BiPAP due to acute respiratory failure and NSTEMI  STUDIES:  None   HISTORY OF PRESENT ILLNESS: George Griffin is a 56 year old male with known history of Lung cancer  S/p radiation and chemotherapy, Hypothyroidism,DM-Type 2,Hyperlipidemia and hypertension.Patient presented to ED on 7/20 with chest pain over a day and shortness of breath. Patient was placed on BiPAP. His breathing improved however he continued to complain of retrosternal pain.  His troponins and blood pressure  were elevated .  Patient denies any previous history of MI.  Patient was placed on Heparin drip for NSTEMI.  Patient was sent to the ICU for closer monitoring.  PAST MEDICAL HISTORY :   has a past medical history of Acquired hypothyroidism (10/05/2015); Constipation; Diabetes mellitus; ED (erectile dysfunction); Hepatic steatosis (07/26/11); Homeless (04/06/2014); Hyperlipidemia; Hypertension; Lung cancer ([place]); Neuropathy of finger; and Pneumonia.  has a past surgical history that includes none. Prior to Admission medications   Medication Sig Start Date End Date Taking? Authorizing Provider  acetaminophen (TYLENOL) 325 MG tablet Take 650 mg by mouth every 6 (six) hours as needed.      [provider]  metformin (FORTAMET) 1000 MG (OSM) 24 hr tablet Take 1 tablet by mouth Daily. 02/03/12   [provider]  sitaGLIPtan-metformin (JANUMET) 50-1000 MG per tablet Take 1 tablet by mouth 3 (three) times daily before meals.     [provider]   No Known Allergies  FAMILY HISTORY:  family history is not on file. SOCIAL HISTORY:  reports that he has quit smoking. He has never used smokeless tobacco. He reports that he does not drink alcohol or use drugs.  REVIEW OF SYSTEMS:   Constitutional: Negative for fever, chills, weight loss, malaise/fatigue and diaphoresis.  HENT: Negative for hearing loss, ear pain, nosebleeds, congestion, sore throat, neck pain, tinnitus and ear discharge.   Eyes: Negative for blurred vision, double vision, photophobia, pain, discharge and redness.  Respiratory: Negative for cough, hemoptysis, sputum production, shortness of breath, wheezing and stridor.   Cardiovascular: Negative for chest pain, palpitations, orthopnea, claudication, leg swelling and PND.  Gastrointestinal: Negative for heartburn, nausea, vomiting, abdominal pain, diarrhea, constipation, blood in stool and melena.  Genitourinary: Negative for dysuria, urgency, frequency, hematuria and flank pain.  Musculoskeletal: Negative for myalgias, back pain, joint pain and falls.  Skin: Negative for itching and rash.  Neurological: Negative for dizziness, tingling, tremors, sensory change, speech change, focal weakness, seizures, loss of consciousness, weakness and headaches.  Endo/Heme/Allergies: Negative for environmental allergies and polydipsia. Does not bruise/bleed easily.    VITAL SIGNS: Temp:  [97.5 F (36.4 C)] 97.5 F (36.4 C) (07/20 0450) Pulse Rate:  [117-130] 119 (07/20 0600) Resp:  [17-40] 17 (07/20 0600) BP: (99-171)/(66-127) 123/86 (07/20 0600) SpO2:  [97 %-100 %] 99 % (07/20 0600) FiO2 (%):  [40 %] 40 % (07/20 0502) Weight:  [175 lb (79.4 kg)] 175 lb (79.4 kg) (07/20 0451)  Physical Examination:   GENERAL:NAD, no fevers, chills, no weakness no fatigue HEAD: Normocephalic, atraumatic.  EYES: Pupils equal, round, reactive to light. Extraocular muscles intact. No scleral icterus.  MOUTH: Moist mucosal membrane. Dentition intact. No abscess noted.  EAR, NOSE, THROAT: Clear without exudates. No external lesions.    NECK: Supple. No thyromegaly. No nodules. No JVD.  PULMONARY: Diffuse coarse rhonchi right sided +wheezes CARDIOVASCULAR: S1 and S2. Regular rate and rhythm. No murmurs, rubs, or gallops. No edema. Pedal pulses 2+ bilaterally.  GASTROINTESTINAL: Soft, nontender, nondistended. No masses. Positive bowel sounds. No hepatosplenomegaly.  MUSCULOSKELETAL: No swelling, clubbing, or edema. Range of motion full in all extremities.  NEUROLOGIC: Cranial nerves II through XII are intact. No gross focal neurological deficits. Sensation intact. Reflexes intact.  SKIN: No ulceration, lesions, rashes, or cyanosis. Skin warm and dry. Turgor intact.  PSYCHIATRIC: Mood, affect within normal limits. The patient is awake, alert and oriented x 3. Insight, judgment intact.         Recent Labs Lab 03/16/17 0450  NA 137  K 4.3  CL 101  CO2 25  BUN 22*  CREATININE 0.89  GLUCOSE 290*    Recent Labs Lab 03/16/17 0450  HGB 14.5  HCT 44.0  WBC 10.4  PLT 177   Dg Chest Portable 1 View  Result Date: 03/16/2017 CLINICAL DATA:  Shortness of breath and history of lung cancer EXAM: PORTABLE CHEST 1 VIEW COMPARISON:  Chest CT 04/17/2016 FINDINGS: Right parahilar mass and streaky associated opacities are similar to the appearance on the chest CT of 04/17/2016. There is no other area of focal consolidation. No pulmonary edema. No pneumothorax or pleural effusion. IMPRESSION: Right parahilar mass and associated linear opacities are likely unchanged compared to the 04/17/2016 chest CT when allowing for differences in modality, though it would be difficult to entirely exclude the possibility of superimposed infection. Electronically Signed   By: Ulyses Jarred M.D.   On: 03/16/2017 05:33    ASSESSMENT / PLAN: NSTEMI Hypertension Elevated troponin possibly related to demand ischemia Hyperlipidemia Lung cancer DM  Plan BiPAP as needed, Wean as tolerated Continue Heparin gtt Continue Aspirin F/u  ECHO Cardiology consulted Continue Coreg Continue Atorvastatin Trend troponins Plan for cardiac cath today  Bincy Varughese,AG-ACNP Pulmonary and [place]    STAFF NOTE: I, Dr. Corrin Parker,  have personally reviewed patient's available data, including medical history, events of note, physical examination and test results as part of my evaluation. I have discussed with NP and other care providers such as pharmacist, RN and RRT.  In addition,  I personally evaluated patient and elicited key findings   ACUTE NSTEMI Plan for cardiac cath today   Kiyoko Mcguirt Patricia Pesa, M.D.  [facility]  Medical Director [place] Director [facility] Cardio-Pulmonary Department

## 2017-03-16 NOTE — ED Notes (Signed)
Per Dr. Kerman Passey request, nitro paste removed and wiped clean from pt's chest

## 2017-03-16 NOTE — ED Notes (Signed)
Received report from Shirley Friar, RN, care assumed. Pt resting in bed, awaiting bed assignment from CCU at this time.

## 2017-03-16 NOTE — Progress Notes (Signed)
Patient is in cath lab. Report called to Richardson Landry, RN on 2A. Patient will be going to room 259 after cardiac cath. Interpreter equipment and patient's belongings bag (flip flops and t-shirt) taken to room 259.

## 2017-03-16 NOTE — Progress Notes (Signed)
ANTICOAGULATION CONSULT NOTE - Initial Consult  Pharmacy Consult for heparin drip Indication: chest pain/ACS  No Known Allergies  Patient Measurements: Height: 5\' 8"  (172.7 cm) Weight: 175 lb (79.4 kg) IBW/kg (Calculated) : 68.4 Heparin Dosing Weight: 79.4 kg  Vital Signs: Temp: 97.5 F (36.4 C) (07/20 0450) Temp Source: Oral (07/20 0450) BP: 147/95 (07/20 0503) Pulse Rate: 117 (07/20 0515)  Labs:  Recent Labs  03/16/17 0450  HGB 14.5  HCT 44.0  PLT 177  CREATININE 0.89  TROPONINI 0.47*    Estimated Creatinine Clearance: 89.7 mL/min (by C-G formula based on SCr of 0.89 mg/dL).   Medical History: Past Medical History:  Diagnosis Date  . Acquired hypothyroidism 10/05/2015  . Constipation   . Diabetes mellitus    metfomin  . ED (erectile dysfunction)   . Hepatic steatosis 07/26/11   severe ct chest  . Homeless 04/06/2014  . Hyperlipidemia   . Hypertension   . Lung cancer ([place])    lung ca dx5/02/2011  . Neuropathy of finger    mild s/p chemotherapy  . Pneumonia    hx    Medications:  Scheduled:  . aspirin  324 mg Oral Once  . heparin  4,000 Units Intravenous Once    Assessment: Patient admitted w/ SOB w/ h/o lung cancer is being started on a heparin drip.  Goal of Therapy:  Heparin level 0.3-0.7 units/ml Monitor platelets by anticoagulation protocol: Yes   Plan:  Give 4000 units bolus x 1  Will start rate at 950 units/hr and will check HL @ 1200. Baseline labs ordered.  Tobie Lords, PharmD, BCPS Clinical Pharmacist 03/16/2017

## 2017-03-16 NOTE — ED Triage Notes (Signed)
Pt to rm 4 via EMS from home, language barrier, pt speaks [place], tele-interpreter at bedside.  Via interpreter, pt report SOB since yesterday evening, reports hx of lung cancer, denies CP.  EMS report pt initially 85% on RA, placed on NRB and o2sat increase to 100%, but continued increased work of breathing, placed on CPAP.  1.5" nitro paste by EMS to left chest. Pt denies CP.  Dr. Kerman Passey at bedside, pt placed on bipap by RT

## 2017-03-16 NOTE — Progress Notes (Signed)
*  PRELIMINARY RESULTS* Echocardiogram 2D Echocardiogram has been performed.  George Griffin 03/16/2017, 8:46 AM

## 2017-03-16 NOTE — Progress Notes (Addendum)
Inpatient Diabetes Program Recommendations  AACE/ADA: New Consensus Statement on Inpatient Glycemic Control (2015)  Target Ranges:  Prepandial:   less than 140 mg/dL      Peak postprandial:   less than 180 mg/dL (1-2 hours)      Critically ill patients:  140 - 180 mg/dL   Lab Results  Component Value Date   GLUCAP 252 (H) 03/16/2017   HGBA1C (H) 01/25/2011    8.8 (NOTE)                                                                       According to the ADA Clinical Practice Recommendations for 2011, when HbA1c is used as a screening test:   >=6.5%   Diagnostic of Diabetes Mellitus           (if abnormal result  is confirmed)  5.7-6.4%   Increased risk of developing Diabetes Mellitus  References:Diagnosis and Classification of Diabetes Mellitus,Diabetes WUJW,1191,47(WGNFA 1):S62-S69 and Standards of Medical Care in         Diabetes - 2011,Diabetes Care,2011,34  (Suppl 1):S11-S61.    Review of Glycemic Control  Results for George Griffin, George Griffin (MRN [id number]) as of 03/16/2017 11:08  Ref. Range 03/16/2017 06:34 03/16/2017 08:01  Glucose-Capillary Latest Ref Range: 65 - 99 mg/dL 307 (H) 252 (H)    Diabetes history: Type 2 Outpatient Diabetes medications: Metformin 1038m qday, Janumet 50/1000mg tid, insulin noted in RN note, 15 units tid   Current orders for Inpatient glycemic control: Novolog 0-15 units q4h  Inpatient Diabetes Program Recommendations:    Per ADA recommendations "consider performing an A1C on all patients with diabetes or hyperglycemia admitted to the hospital if not performed in the prior 3 months".  Agree with current medications for blood sugar management.   Met with the patient and a [place]preter to discuss recent diabetes medications/care.  He tells me he was ordered 15 units of insulin tid but cannot tell me what type it is.  He was not taking the insulin and it has been "a long time" since he saw a doctor.  He is taking Metformin 100101monce a day. [place].  [place] has lived in [place]. He tells the interpreter he is divorced and does not have children who live near here and no one that can go to doctor's appointments with him to assist him in getting the right information communicated to the MD.  He does "tell" me that he has a meter and testing supplies at home and he is checking sugars- I have reminded him to take those blood sugars with him each time he sees a doctor.    JuGentry FitzRN, BA, MHA, CDE Diabetes Coordinator Inpatient Diabetes Program  [phone] Pager) [phone]/20/2018 11:13 AM

## 2017-03-17 ENCOUNTER — Inpatient Hospital Stay (HOSPITAL_COMMUNITY): Payer: Medicare Other

## 2017-03-17 ENCOUNTER — Encounter (HOSPITAL_COMMUNITY): Payer: Self-pay | Admitting: Thoracic Surgery (Cardiothoracic Vascular Surgery)

## 2017-03-17 ENCOUNTER — Inpatient Hospital Stay (HOSPITAL_COMMUNITY)
Admission: AD | Admit: 2017-03-17 | Discharge: 2017-03-31 | DRG: 233 | Disposition: A | Discharge status: Home / Self Care | Payer: Medicare Other | Source: Other Acute Inpatient Hospital | Attending: Cardiothoracic Surgery | Admitting: Cardiothoracic Surgery

## 2017-03-17 DIAGNOSIS — I25118 Atherosclerotic heart disease of native coronary artery with other forms of angina pectoris: Secondary | ICD-10-CM

## 2017-03-17 DIAGNOSIS — E785 Hyperlipidemia, unspecified: Secondary | ICD-10-CM | POA: Diagnosis present

## 2017-03-17 DIAGNOSIS — E1165 Type 2 diabetes mellitus with hyperglycemia: Secondary | ICD-10-CM | POA: Diagnosis present

## 2017-03-17 DIAGNOSIS — I9789 Other postprocedural complications and disorders of the circulatory system, not elsewhere classified: Secondary | ICD-10-CM | POA: Diagnosis not present

## 2017-03-17 DIAGNOSIS — Z794 Long term (current) use of insulin: Secondary | ICD-10-CM

## 2017-03-17 DIAGNOSIS — I214 Non-ST elevation (NSTEMI) myocardial infarction: Principal | ICD-10-CM

## 2017-03-17 DIAGNOSIS — K5900 Constipation, unspecified: Secondary | ICD-10-CM | POA: Diagnosis present

## 2017-03-17 DIAGNOSIS — I110 Hypertensive heart disease with heart failure: Secondary | ICD-10-CM | POA: Diagnosis present

## 2017-03-17 DIAGNOSIS — Z560 Unemployment, unspecified: Secondary | ICD-10-CM | POA: Diagnosis not present

## 2017-03-17 DIAGNOSIS — J939 Pneumothorax, unspecified: Secondary | ICD-10-CM

## 2017-03-17 DIAGNOSIS — R338 Other retention of urine: Secondary | ICD-10-CM | POA: Diagnosis present

## 2017-03-17 DIAGNOSIS — Z7982 Long term (current) use of aspirin: Secondary | ICD-10-CM

## 2017-03-17 DIAGNOSIS — N401 Enlarged prostate with lower urinary tract symptoms: Secondary | ICD-10-CM | POA: Diagnosis present

## 2017-03-17 DIAGNOSIS — E119 Type 2 diabetes mellitus without complications: Secondary | ICD-10-CM

## 2017-03-17 DIAGNOSIS — I255 Ischemic cardiomyopathy: Secondary | ICD-10-CM | POA: Diagnosis present

## 2017-03-17 DIAGNOSIS — D62 Acute posthemorrhagic anemia: Secondary | ICD-10-CM | POA: Diagnosis not present

## 2017-03-17 DIAGNOSIS — I5021 Acute systolic (congestive) heart failure: Secondary | ICD-10-CM | POA: Diagnosis not present

## 2017-03-17 DIAGNOSIS — Z9221 Personal history of antineoplastic chemotherapy: Secondary | ICD-10-CM

## 2017-03-17 DIAGNOSIS — I25119 Atherosclerotic heart disease of native coronary artery with unspecified angina pectoris: Secondary | ICD-10-CM | POA: Diagnosis present

## 2017-03-17 DIAGNOSIS — Z79899 Other long term (current) drug therapy: Secondary | ICD-10-CM | POA: Diagnosis not present

## 2017-03-17 DIAGNOSIS — I25110 Atherosclerotic heart disease of native coronary artery with unstable angina pectoris: Secondary | ICD-10-CM | POA: Diagnosis present

## 2017-03-17 DIAGNOSIS — Z923 Personal history of irradiation: Secondary | ICD-10-CM | POA: Diagnosis not present

## 2017-03-17 DIAGNOSIS — Z87891 Personal history of nicotine dependence: Secondary | ICD-10-CM

## 2017-03-17 DIAGNOSIS — Z951 Presence of aortocoronary bypass graft: Secondary | ICD-10-CM

## 2017-03-17 DIAGNOSIS — C3490 Malignant neoplasm of unspecified part of unspecified bronchus or lung: Secondary | ICD-10-CM

## 2017-03-17 DIAGNOSIS — E039 Hypothyroidism, unspecified: Secondary | ICD-10-CM | POA: Diagnosis present

## 2017-03-17 DIAGNOSIS — Z85118 Personal history of other malignant neoplasm of bronchus and lung: Secondary | ICD-10-CM

## 2017-03-17 DIAGNOSIS — E1140 Type 2 diabetes mellitus with diabetic neuropathy, unspecified: Secondary | ICD-10-CM | POA: Diagnosis present

## 2017-03-17 DIAGNOSIS — Y838 Other surgical procedures as the cause of abnormal reaction of the patient, or of later complication, without mention of misadventure at the time of the procedure: Secondary | ICD-10-CM | POA: Diagnosis present

## 2017-03-17 DIAGNOSIS — K760 Fatty (change of) liver, not elsewhere classified: Secondary | ICD-10-CM | POA: Diagnosis present

## 2017-03-17 DIAGNOSIS — R0602 Shortness of breath: Secondary | ICD-10-CM

## 2017-03-17 DIAGNOSIS — C3400 Malignant neoplasm of unspecified main bronchus: Secondary | ICD-10-CM

## 2017-03-17 DIAGNOSIS — I2510 Atherosclerotic heart disease of native coronary artery without angina pectoris: Secondary | ICD-10-CM

## 2017-03-17 HISTORY — DX: Ischemic cardiomyopathy: I25.5

## 2017-03-17 HISTORY — DX: Non-ST elevation (NSTEMI) myocardial infarction: I21.4

## 2017-03-17 HISTORY — DX: Atherosclerotic heart disease of native coronary artery without angina pectoris: I25.10

## 2017-03-17 LAB — BASIC METABOLIC PANEL
Anion gap: 8 (ref 5–15)
BUN: 14 mg/dL (ref 6–20)
CO2: 26 mmol/L (ref 22–32)
CREATININE: 0.7 mg/dL (ref 0.61–1.24)
Calcium: 8.3 mg/dL — ABNORMAL LOW (ref 8.9–10.3)
Chloride: 104 mmol/L (ref 101–111)
GFR calc Af Amer: >60 mL/min (ref >60)
GLUCOSE: 159 mg/dL — AB (ref 65–99)
POTASSIUM: 4 mmol/L (ref 3.5–5.1)
SODIUM: 138 mmol/L (ref 135–145)

## 2017-03-17 LAB — CBC
HCT: 41.3 % (ref 40.0–52.0)
HEMOGLOBIN: 14.2 g/dL (ref 13.0–18.0)
MCH: 29.9 pg (ref 26.0–34.0)
MCHC: 34.4 g/dL (ref 32.0–36.0)
MCV: 86.9 fL (ref 80.0–100.0)
PLATELETS: 158 10*3/uL (ref 150–440)
RBC: 4.76 MIL/uL (ref 4.40–5.90)
RDW: 14.1 % (ref 11.5–14.5)
WBC: 7.5 10*3/uL (ref 3.8–10.6)

## 2017-03-17 LAB — URINALYSIS, COMPLETE (UACMP) WITH MICROSCOPIC
Bacteria, UA: NONE SEEN
Bilirubin Urine: NEGATIVE
Hgb urine dipstick: NEGATIVE
Ketones, ur: NEGATIVE mg/dL
LEUKOCYTES UA: NEGATIVE
Nitrite: NEGATIVE
PH: 6 (ref 5.0–8.0)
Protein, ur: NEGATIVE mg/dL
SPECIFIC GRAVITY, URINE: 1.02 (ref 1.005–1.030)
SQUAMOUS EPITHELIAL / LPF: NONE SEEN

## 2017-03-17 LAB — GLUCOSE, CAPILLARY
GLUCOSE-CAPILLARY: 176 mg/dL — AB (ref 65–99)
GLUCOSE-CAPILLARY: 177 mg/dL — AB (ref 65–99)
GLUCOSE-CAPILLARY: 263 mg/dL — AB (ref 65–99)
Glucose-Capillary: 125 mg/dL — ABNORMAL HIGH (ref 65–99)
Glucose-Capillary: 336 mg/dL — ABNORMAL HIGH (ref 65–99)
Glucose-Capillary: 216 mg/dL — ABNORMAL HIGH (ref 65–99)

## 2017-03-17 LAB — HEPARIN LEVEL (UNFRACTIONATED): HEPARIN UNFRACTIONATED: 0.63 [IU]/mL (ref 0.30–0.70)

## 2017-03-17 LAB — LIPID PANEL
CHOL/HDL RATIO: 4.4 ratio
CHOLESTEROL: 158 mg/dL (ref 0–200)
HDL: 36 mg/dL — ABNORMAL LOW (ref >40)
LDL Cholesterol: 100 mg/dL — ABNORMAL HIGH (ref 0–99)
Triglycerides: 109 mg/dL (ref <150)
VLDL: 22 mg/dL (ref 0–40)

## 2017-03-17 LAB — MRSA PCR SCREENING: MRSA by PCR: NEGATIVE

## 2017-03-17 MED ORDER — ATORVASTATIN CALCIUM 80 MG PO TABS
80.0000 mg | ORAL_TABLET | Freq: Every day | ORAL | Status: DC
  Administered 2017-03-17 – 2017-03-22 (×6): 80 mg via ORAL
  Filled 2017-03-17 (×6): qty 1

## 2017-03-17 MED ORDER — BUDESONIDE 0.5 MG/2ML IN SUSP
0.5000 mg | Freq: Once | RESPIRATORY_TRACT | Status: AC
  Administered 2017-03-17: 0.5 mg via RESPIRATORY_TRACT
  Filled 2017-03-17: qty 2

## 2017-03-17 MED ORDER — HEPARIN (PORCINE) IN NACL 100-0.45 UNIT/ML-% IJ SOLN: 1100.0000 [IU]/h | INTRAMUSCULAR | Status: DC

## 2017-03-17 MED ORDER — INSULIN DETEMIR 100 UNIT/ML ~~LOC~~ SOLN
40.0000 [IU] | Freq: Every day | SUBCUTANEOUS | Status: DC
  Administered 2017-03-17 – 2017-03-18 (×2): 40 [IU] via SUBCUTANEOUS
  Filled 2017-03-17 (×2): qty 0.4

## 2017-03-17 MED ORDER — HEPARIN (PORCINE) IN NACL 100-0.45 UNIT/ML-% IJ SOLN
1400.0000 [IU]/h | INTRAMUSCULAR | Status: DC
  Administered 2017-03-17 – 2017-03-18 (×2): 1100 [IU]/h via INTRAVENOUS
  Administered 2017-03-19 – 2017-03-21 (×2): 1400 [IU]/h via INTRAVENOUS
  Filled 2017-03-17 (×5): qty 250

## 2017-03-17 MED ORDER — INSULIN ASPART 100 UNIT/ML ~~LOC~~ SOLN
0.0000 [IU] | Freq: Three times a day (TID) | SUBCUTANEOUS | Status: DC
  Administered 2017-03-17: 7 [IU] via SUBCUTANEOUS
  Administered 2017-03-18: 3 [IU] via SUBCUTANEOUS
  Administered 2017-03-18: 5 [IU] via SUBCUTANEOUS
  Administered 2017-03-18 – 2017-03-19 (×2): 3 [IU] via SUBCUTANEOUS

## 2017-03-17 MED ORDER — ASPIRIN 81 MG PO TBEC: 81.0000 mg | DELAYED_RELEASE_TABLET | Freq: Every day | ORAL | Status: DC

## 2017-03-17 MED ORDER — IPRATROPIUM-ALBUTEROL 0.5-2.5 (3) MG/3ML IN SOLN
3.0000 mL | RESPIRATORY_TRACT | Status: AC
  Administered 2017-03-17: 3 mL via RESPIRATORY_TRACT
  Filled 2017-03-17: qty 3

## 2017-03-17 MED ORDER — CARVEDILOL 6.25 MG PO TABS
6.2500 mg | ORAL_TABLET | Freq: Two times a day (BID) | ORAL | Status: DC
  Administered 2017-03-17 – 2017-03-19 (×4): 6.25 mg via ORAL
  Filled 2017-03-17 (×4): qty 1

## 2017-03-17 MED ORDER — IOPAMIDOL (ISOVUE-370) INJECTION 76%
INTRAVENOUS | Status: AC
  Administered 2017-03-17: 100 mL via INTRAVENOUS
  Filled 2017-03-17: qty 100

## 2017-03-17 MED ORDER — ASPIRIN EC 81 MG PO TBEC
81.0000 mg | DELAYED_RELEASE_TABLET | Freq: Every day | ORAL | Status: DC
  Administered 2017-03-19 – 2017-03-23 (×4): 81 mg via ORAL
  Filled 2017-03-17 (×4): qty 1

## 2017-03-17 MED ORDER — CARVEDILOL 3.125 MG PO TABS: 3.1250 mg | ORAL_TABLET | Freq: Two times a day (BID) | ORAL | Status: DC

## 2017-03-17 MED ORDER — NITROGLYCERIN 0.4 MG SL SUBL: 0.4000 mg | SUBLINGUAL_TABLET | SUBLINGUAL | Status: DC | PRN

## 2017-03-17 MED ORDER — ATORVASTATIN CALCIUM 80 MG PO TABS: 80.0000 mg | ORAL_TABLET | Freq: Every day | ORAL | Status: DC

## 2017-03-17 MED ORDER — ONDANSETRON HCL 4 MG/2ML IJ SOLN: 4.0000 mg | Freq: Four times a day (QID) | INTRAMUSCULAR | Status: DC | PRN

## 2017-03-17 MED ORDER — ACETAMINOPHEN 325 MG PO TABS: 650.0000 mg | ORAL_TABLET | ORAL | Status: DC | PRN

## 2017-03-17 MED ORDER — ORAL CARE MOUTH RINSE
15.0000 mL | Freq: Two times a day (BID) | OROMUCOSAL | Status: DC
  Administered 2017-03-17 – 2017-03-23 (×10): 15 mL via OROMUCOSAL

## 2017-03-17 NOTE — Progress Notes (Signed)
Patient admitted to the unit, arrived via [place] from [facility]. Patient speaks [place] with minimal English. Used the video [place] interpreter for admission. Patient has a large amount of cash, $7,591 along with wallet that has 3 credit cards. These items were counted by 3 RNs (myself, Hayley, and Emer) in front of the patient and the interpreter. Valuables were handed off to security officers and was signed for. Patient still has identification card, keys, and clothing at bedside. Patient reports no pain but that he feels short of breath. Patient is sinus tach on the monitor, breath sounds clear. Will continue to monitor. NP at the bedside.

## 2017-03-17 NOTE — Discharge Summary (Signed)
[facility] NAME: George Griffin    MR#:  [id number]  DATE OF BIRTH:  1961/04/22  DATE OF ADMISSION:  03/16/2017 ADMITTING PHYSICIAN: Saundra Shelling, MD  DATE OF DISCHARGE: 03/17/2017  PRIMARY CARE PHYSICIAN: Jilda Panda, MD    ADMISSION DIAGNOSIS:  NSTEMI (non-ST elevated myocardial infarction) ([place]) [I21.4]  DISCHARGE DIAGNOSIS:  Active Problems:   Non-STEMI (non-ST elevated myocardial infarction) ([place])   SECONDARY DIAGNOSIS:   Past Medical History:  Diagnosis Date  . Acquired hypothyroidism 10/05/2015  . Constipation   . Diabetes mellitus    metfomin  . ED (erectile dysfunction)   . Hepatic steatosis 07/26/11   severe ct chest  . Hyperlipidemia   . Hypertension   . Lung cancer ([place])    lung ca dx5/02/2011  . Neuropathy of finger    mild s/p chemotherapy  . Pneumonia    hx    HOSPITAL COURSE:   56 year old male with past medical history of diabetes, hypertension who has not been compliant with his medications over the past few months resents to the hospital due to shortness of breath and has ruled in for a non-ST elevation MI.  1. Non-ST elevation MI-patient ruled in with cardiac markers. -Patient underwent cardiac catheterization which showed Severe three-vessel coronary artery disease with current crit high-grade stenosis mid LAD, 80% stenosis mid RCA with diffuse distal involvement, irregular 75% stenosis proximal left circumflex and Severe dilated cardiomyopathy. -Patient is presently being transferred to [facility] for evaluation for coronary artery bypass graft surgery and also possible need for an AICD. -Presently patient is not having any chest pain but complaining of some exertional dyspnea.  -Continue aspirin, heparin drip, Coreg, atorvastatin, nitroglycerin as needed.  2. Acute respiratory failure with hypoxia-this was secondary to COPD/asthma and initially patient was admitted to ICU and placed on  BiPAP. He has not been weaned off the BiPAP. -Continue duo nebs as needed, Pulmicort nebs.  3. Diabetes type 2 without complication-patient has been noncompliant with his meds for a while. Continue insulin sliding scale, Levemir for now. Hold metformin as patient recently had cardiac catheterization.  4. Essential hypertension-continue carvedilol.  -Patient is being transferred to [facility] for evaluation for coronary artery bypass graft surgery and need for ICD.  DISCHARGE CONDITIONS:   Stable  CONSULTS OBTAINED:  Treatment Team:  Isaias Cowman, MD  DRUG ALLERGIES:  No Known Allergies  DISCHARGE MEDICATIONS:   Allergies as of 03/17/2017   No Known Allergies     Medication List    STOP taking these medications   metformin 1000 MG (OSM) 24 hr tablet Commonly known as:  FORTAMET     TAKE these medications   acetaminophen 325 MG tablet Commonly known as:  TYLENOL Take 650 mg by mouth every 6 (six) hours as needed.   aspirin 81 MG EC tablet Take 1 tablet (81 mg total) by mouth daily.   atorvastatin 80 MG tablet Commonly known as:  LIPITOR Take 1 tablet (80 mg total) by mouth daily at 6 PM.   carvedilol 3.125 MG tablet Commonly known as:  COREG Take 1 tablet (3.125 mg total) by mouth 2 (two) times daily with a meal.   heparin 100-0.45 UNIT/ML-% infusion Inject 1,100 Units/hr into the vein continuous.   insulin detemir 100 UNIT/ML injection Commonly known as:  LEVEMIR Inject 40 Units into the skin at bedtime.   sitaGLIPtin-metformin 50-1000 MG tablet Commonly known as:  JANUMET Take 1 tablet by  mouth 3 (three) times daily before meals.         DISCHARGE INSTRUCTIONS:   DIET:  Cardiac diet and Diabetic diet  DISCHARGE CONDITION:  Stable  ACTIVITY:  Activity as tolerated  OXYGEN:  Home Oxygen: No.   Oxygen Delivery: room air  DISCHARGE LOCATION:  [facility].    If you experience worsening of your admission  symptoms, develop shortness of breath, life threatening emergency, suicidal or homicidal thoughts you must seek medical attention immediately by calling 911 or calling your MD immediately  if symptoms less severe.  You Must read complete instructions/literature along with all the possible adverse reactions/side effects for all the Medicines you take and that have been prescribed to you. Take any new Medicines after you have completely understood and accpet all the possible adverse reactions/side effects.   Please note  You were cared for by a hospitalist during your hospital stay. If you have any questions about your discharge medications or the care you received while you were in the hospital after you are discharged, you can call the unit and asked to speak with the hospitalist on call if the hospitalist that took care of you is not available. Once you are discharged, your primary care physician will handle any further medical issues. Please note that NO REFILLS for any discharge medications will be authorized once you are discharged, as it is imperative that you return to your primary care physician (or establish a relationship with a primary care physician if you do not have one) for your aftercare needs so that they can reassess your need for medications and monitor your lab values.     Today   Still having some exertional dyspnea.  Minimal chest pain.  No other symptoms.   VITAL SIGNS:  Blood pressure 108/70, pulse (!) 101, temperature 98.4 F (36.9 C), temperature source Oral, resp. rate 17, height 5\' 8"  (1.727 m), weight 75.8 kg (167 lb), SpO2 98 %.  I/O:   Intake/Output Summary (Last 24 hours) at 03/17/17 0943 Last data filed at 03/17/17 0656  Gross per 24 hour  Intake            192.3 ml  Output             1400 ml  Net          -1207.7 ml    PHYSICAL EXAMINATION:  GENERAL:  57 y.o.-year-old patient lying in the bed with no acute distress.  EYES: Pupils equal, round, reactive  to light and accommodation. No scleral icterus. Extraocular muscles intact.  HEENT: Head atraumatic, normocephalic. Oropharynx and nasopharynx clear.  NECK:  Supple, no jugular venous distention. No thyroid enlargement, no tenderness.  LUNGS: Normal breath sounds bilaterally, minimal end-exp. Wheezing b/l, No rales,rhonchi. No use of accessory muscles of respiration.  CARDIOVASCULAR: S1, S2 normal. No murmurs, rubs, or gallops.  ABDOMEN: Soft, non-tender, non-distended. Bowel sounds present. No organomegaly or mass.  EXTREMITIES: No pedal edema, cyanosis, or clubbing.  NEUROLOGIC: Cranial nerves II through XII are intact. No focal motor or sensory defecits b/l.  PSYCHIATRIC: The patient is alert and oriented x 3.  SKIN: No obvious rash, lesion, or ulcer.   DATA REVIEW:   CBC  Recent Labs Lab 03/17/17 0530  WBC 7.5  HGB 14.2  HCT 41.3  PLT 158    Chemistries   Recent Labs Lab 03/16/17 0450 03/17/17 0530  NA 137 138  K 4.3 4.0  CL 101 104  CO2 25 26  GLUCOSE 290* 159*  BUN 22* 14  CREATININE 0.89 0.70  CALCIUM 9.0 8.3*  AST 32  --   ALT 29  --   ALKPHOS 39  --   BILITOT 0.9  --     Cardiac Enzymes  Recent Labs Lab 03/16/17 2016  TROPONINI 1.92*    Microbiology Results  Results for orders placed or performed during the hospital encounter of 03/16/17  MRSA PCR Screening     Status: None   Collection Time: 03/16/17  8:02 AM  Result Value Ref Range Status   MRSA by PCR NEGATIVE NEGATIVE Final    Comment:        The GeneXpert MRSA Assay (FDA approved for NASAL specimens only), is one component of a comprehensive MRSA colonization surveillance program. It is not intended to diagnose MRSA infection nor to guide or monitor treatment for MRSA infections.     RADIOLOGY:  Dg Chest Portable 1 View  Result Date: 03/16/2017 CLINICAL DATA:  Shortness of breath and history of lung cancer EXAM: PORTABLE CHEST 1 VIEW COMPARISON:  Chest CT 04/17/2016 FINDINGS:  Right parahilar mass and streaky associated opacities are similar to the appearance on the chest CT of 04/17/2016. There is no other area of focal consolidation. No pulmonary edema. No pneumothorax or pleural effusion. IMPRESSION: Right parahilar mass and associated linear opacities are likely unchanged compared to the 04/17/2016 chest CT when allowing for differences in modality, though it would be difficult to entirely exclude the possibility of superimposed infection. Electronically Signed   By: Ulyses Jarred M.D.   On: 03/16/2017 05:33      Management plans discussed with the patient, family and they are in agreement.  CODE STATUS:     Code Status Orders        Start     Ordered   03/16/17 0802  Full code  Continuous     03/16/17 0801    Code Status History    Date Active Date Inactive Code Status Order ID Comments User Context   This patient has a current code status but no historical code status.      TOTAL TIME TAKING CARE OF THIS PATIENT: 40 minutes.    Henreitta Leber M.D on 03/17/2017 at 9:43 AM  Between 7am to 6pm - Pager - [phone]  After 6pm go to [email or web address] - Proofreader  [facility]  Office  [phone]  CC: Primary care physician; Jilda Panda, MD

## 2017-03-17 NOTE — Care Management Note (Addendum)
Case Management Note  Patient Details  Name: Hobert [place] Pettengill MRN: [id number] Date of Birth: 31-Oct-1960  Subjective/Objective:     Mr Eggert is being transferred to [facility] hospital later today per his nurse for a cardiac procedure that cannot be performed at [facility]. Mr Mcgowan is uninsured, speaks [place] with limited English comprehension. Will probably need MATCH for medication assistance upon discharge.   .            Action/Plan:   Expected Discharge Date:  03/17/17               Expected Discharge Plan:   03/17/17  In-House Referral:     Discharge planning Services  CM Consult, Medication Assistance, [facility]  Post Acute Care Choice:    Choice offered to:  Patient  DME Arranged:    DME Agency:     HH Arranged:    [place] Agency:     Status of Service:  In process, will continue to follow  Completed, Transfer to [facility]   If discussed at [place] of Stay Meetings, dates discussed:    Additional Comments:  Vania Rosero A, RN 03/17/2017, 10:01 AM

## 2017-03-17 NOTE — Consult Note (Addendum)
[address]       [place] [address]             [phone]        George Griffin Medical Record #[id number] Date of Birth: [birth year]  Referring: Isaias Cowman, MD Primary Care: Jilda Panda, MD  Chief Complaint: NSTEMI  History of Present Illness:    The patient is a 56 year old male with a known history of lung cancer status post chemotherapy and radiation who presented to the emergency department [facility] yesterday with complaints of shortness of breath. Additionally he describes a squeezing type chest pain for approximately 1 day. He was stabilized in the emergency department and did rule in for non-STEMI. He was seen in cardiology consultation and felt to require cardiac catheterization which was done and revealed significant multivessel coronary artery disease. We are asked to see the patient in consultation for consideration of surgical revascularization. Peak troponin thus far is 1.92. The patient has multiple cardiac comorbidities including non-insulin-dependent diabetes, hyperlipidemia and hypertension. 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    Current Activity/ Functional Status: Patient is independent with mobility/ambulation, transfers, ADL's, IADL's.   Zubrod Score: At the time of surgery this patient's most appropriate activity status/level should be described as: [x]     0    Normal activity, no symptoms []     1    Restricted in physical strenuous activity but ambulatory, able to do out light work []     2    Ambulatory and capable of self care, unable to do work activities, up and about                 more than 50%  Of the time                            []     3    Only limited self care, in bed greater than 50% of waking hours []     4    Completely disabled, no self care, confined to bed or chair []     5    Moribund  Past Medical History:  Diagnosis Date  . Acquired hypothyroidism  10/05/2015  . Constipation   . Diabetes mellitus    metfomin  . ED (erectile dysfunction)   . Hepatic steatosis 07/26/11   severe ct chest  . Hyperlipidemia   . Hypertension   . Lung cancer ([place])    lung ca dx5/02/2011  . Neuropathy of finger    mild s/p chemotherapy  . Pneumonia    hx    Past Surgical History:  Procedure Laterality Date  . none     Patient Active Problem List   Diagnosis Date Noted  . Non-STEMI (non-ST elevated myocardial infarction) ([place]) 03/16/2017  . Chest congestion 04/18/2016  . Abscess of back 03/24/2016  . Acquired hypothyroidism 10/05/2015  . Poor social situation 10/05/2015  . Constipation, acute 10/05/2015  . Weight loss, unintentional 10/05/2015  . Acute thoracic back pain 03/29/2015  . Homeless 04/06/2014  . Dyspnea 04/06/2014  . History of lung cancer 08/08/2011  . Diabetes mellitus 08/03/2011  . Hyperlipidemia   . Diabetic neuropathy, type II diabetes mellitus ([place])   . Hepatic steatosis   . ASVD 10/28/2010  . DM 10/24/2010   History  Smoking Status  . Former Smoker  Smokeless Tobacco  . Never Used  History  Alcohol Use No   Smoked cigarettes from age 59 till 2008 Social History   Social History  . Marital status: Married    Spouse name: N/A  . Number of children: N/A  . Years of education: N/A   Occupational History  .  Unemployed   Social History Main Topics  . Smoking status: Former Research scientist (life sciences)  . Smokeless tobacco: Never Used  . Alcohol use No  . Drug use: No  . Sexual activity: Not on file   Other Topics Concern  . Not on file   Social History Narrative  . No narrative on file    No Known Allergies  No current facility-administered medications for this encounter.     Prescriptions Prior to Admission  Medication Sig Dispense Refill Last Dose  . acetaminophen (TYLENOL) 325 MG tablet Take 650 mg by mouth every 6 (six) hours as needed.     PRN at PRN  . [START ON 03/18/2017] aspirin EC 81 MG EC tablet Take 1  tablet (81 mg total) by mouth daily.     Marland Kitchen atorvastatin (LIPITOR) 80 MG tablet Take 1 tablet (80 mg total) by mouth daily at 6 PM.     . carvedilol (COREG) 3.125 MG tablet Take 1 tablet (3.125 mg total) by mouth 2 (two) times daily with a meal.     . heparin 100-0.45 UNIT/ML-% infusion Inject 1,100 Units/hr into the vein continuous. 250 mL    . insulin detemir (LEVEMIR) 100 UNIT/ML injection Inject 40 Units into the skin at bedtime.   Not Taking at Unknown time  . sitaGLIPtan-metformin (JANUMET) 50-1000 MG per tablet Take 1 tablet by mouth 3 (three) times daily before meals.    Not Taking at Unknown time    Family History  Problem Relation Age of Onset  . CAD Neg Hx   . Hypertension Neg Hx   . Diabetes Neg Hx      Review of Systems  Constitutional: Positive for malaise/fatigue and weight loss. Negative for chills, diaphoresis and fever.  HENT: Positive for ear pain and tinnitus. Negative for sinus pain.   Eyes: Positive for blurred vision.  Respiratory: Positive for shortness of breath. Negative for stridor.   Cardiovascular: Positive for chest pain. Negative for palpitations, orthopnea, claudication, leg swelling and PND.  Gastrointestinal: Positive for constipation and heartburn. Negative for abdominal pain, blood in stool, diarrhea, melena, nausea and vomiting.  Genitourinary: Negative.   Musculoskeletal: Positive for back pain, joint pain and neck pain.  Skin: Negative for itching and rash.  Neurological: Positive for tingling, weakness and headaches. Negative for tremors, sensory change, speech change, focal weakness, seizures and loss of consciousness.  Endo/Heme/Allergies: Does not bruise/bleed easily.  Psychiatric/Behavioral: Negative for depression, hallucinations, memory loss, substance abuse and suicidal ideas. The patient has insomnia. The patient is not nervous/anxious.    Physical Exam: BP 114/81   Pulse (!) 106   Resp (!) 23   SpO2 97%  Physical Exam    Constitutional: He is oriented to person, place, and time. He appears well-developed and well-nourished. No distress.  HENT:  Head: Normocephalic and atraumatic.  Mouth/Throat: Oropharynx is clear and moist.  Poor dentition with many missing teeth  Eyes: Pupils are equal, round, and reactive to light. Conjunctivae and EOM are normal. Right eye exhibits no discharge. Left eye exhibits no discharge. No scleral icterus.  Neck: No JVD present. No tracheal deviation present. No thyromegaly present.  Cardiovascular: Normal rate, regular rhythm and intact distal pulses.  Exam reveals no gallop and no friction rub.   No murmur heard. No carotid bruits  Pulmonary/Chest: No respiratory distress. He has no wheezes. He has no rales. He exhibits no tenderness.  Abdominal: He exhibits no distension and no mass. There is no tenderness. There is no rebound and no guarding.  Musculoskeletal: Normal range of motion. He exhibits no edema, tenderness or deformity.  Lymphadenopathy:    He has no cervical adenopathy.  Neurological: He is alert and oriented to person, place, and time. He exhibits normal muscle tone. Coordination normal.  Skin: Skin is warm and dry. No rash noted. No erythema. No pallor.  Psychiatric: He has a normal mood and affect. His behavior is normal. Judgment and thought content normal.   Diagnostic Studies & Laboratory data:     Recent Radiology Findings:   Dg Chest Portable 1 View  Result Date: 03/16/2017 CLINICAL DATA:  Shortness of breath and history of lung cancer EXAM: PORTABLE CHEST 1 VIEW COMPARISON:  Chest CT 04/17/2016 FINDINGS: Right parahilar mass and streaky associated opacities are similar to the appearance on the chest CT of 04/17/2016. There is no other area of focal consolidation. No pulmonary edema. No pneumothorax or pleural effusion. IMPRESSION: Right parahilar mass and associated linear opacities are likely unchanged compared to the 04/17/2016 chest CT when allowing for  differences in modality, though it would be difficult to entirely exclude the possibility of superimposed infection. Electronically Signed   By: Ulyses Jarred M.D.   On: 03/16/2017 05:33     I have independently reviewed the above radiologic studies.  Recent Lab Findings: Lab Results  Component Value Date   WBC 7.5 03/17/2017   HGB 14.2 03/17/2017   HCT 41.3 03/17/2017   PLT 158 03/17/2017   GLUCOSE 159 (H) 03/17/2017   CHOL 158 03/17/2017   TRIG 109 03/17/2017   HDL 36 (L) 03/17/2017   LDLCALC 100 (H) 03/17/2017   ALT 29 03/16/2017   AST 32 03/16/2017   NA 138 03/17/2017   K 4.0 03/17/2017   CL 104 03/17/2017   CREATININE 0.70 03/17/2017   BUN 14 03/17/2017   CO2 26 03/17/2017   TSH 0.802 10/11/2015   INR 0.98 03/16/2017   HGBA1C (H) 01/25/2011    8.8 (NOTE)                                                                       According to the ADA Clinical Practice Recommendations for 2011, when HbA1c is used as a screening test:   >=6.5%   Diagnostic of Diabetes Mellitus           (if abnormal result  is confirmed)  5.7-6.4%   Increased risk of developing Diabetes Mellitus  References:Diagnosis and Classification of Diabetes Mellitus,Diabetes ZOXW,9604,54(UJWJX 1):S62-S69 and Standards of Medical Care in         Diabetes - 2011,Diabetes Care,2011,34  (Suppl 1):S11-S61.    Paraschos, Alexander, MD (Primary)    Procedures   Left Heart Cath and Coronary Angiography  Conclusion     Ost Cx to Prox Cx lesion, 75 %stenosed.  Prox LAD to Mid LAD lesion, 95 %stenosed.  Dist LAD lesion, 60 %stenosed.  Ost 1st Diag to 1st Diag lesion, 90 %stenosed.  Mid RCA lesion, 80 %stenosed.  Dist RCA lesion, 95 %stenosed.  Ost RPDA to RPDA lesion, 75 %stenosed.  2nd RPLB lesion, 75 %stenosed.  RPDA lesion, 90 %stenosed.   1. Severe three-vessel coronary artery disease with current crit high-grade stenosis mid LAD, 80% stenosis mid RCA with diffuse distal involvement, irregular  75% stenosis proximal left circumflex 2. Severe dilated cardiomyopathy  Recommendations  1. CABG 2. Probable ICD   Procedural Details/Technique   Technical Details A 5 French sheath was introduced in the right femoral artery. Left ventriculography was performed with a pigtail catheter. Coronary angiography was performed with standard left and right diagnostic catheters. The right femoral artery was closed with a Mynx closure device. The patient received 1 mg of Versed and 50 g of fentanyl. Total contrast is 110 cc. There were no periprocedural complications.   Estimated blood loss <50 mL.  During this procedure the patient was administered the following to achieve and maintain moderate conscious sedation: Versed mg, while the patient's heart rate, blood pressure, and oxygen saturation were continuously monitored.    Coronary Findings   Dominance: Right  Left Anterior Descending  Prox LAD to Mid LAD lesion, 95% stenosed. The lesion is tubular, eccentric and irregular.  Dist LAD lesion, 60% stenosed. The lesion is tubular.  First Diagonal Branch  Ost 1st Diag to 1st Diag lesion, 90% stenosed. The lesion is tubular.  Left Circumflex  Ost Cx to Prox Cx lesion, 75% stenosed. The lesion is tubular.  Right Coronary Artery  Mid RCA lesion, 80% stenosed. The lesion is eccentric and irregular.  Dist RCA lesion, 95% stenosed. The lesion is tubular.  Right Posterior Descending Artery  Ost RPDA to RPDA lesion, 75% stenosed. The lesion is discrete.  RPDA lesion, 90% stenosed. The lesion is tubular.  Second Right Posterolateral  2nd RPLB lesion, 75% stenosed. The lesion is tubular, eccentric and irregular.  Wall Motion              Coronary Diagrams   Diagnostic Diagram             Echocardiogram:  Result status: Final result                   *[place], [place] [address]                             [phone]  ------------------------------------------------------------------- Transthoracic Echocardiography  Patient:    George Griffin, George Griffin MR #:       [id number] Study Date: 03/16/2017 Gender:     M Age:        43 Height:     172.7 cm Weight:     79.4 kg BSA:        1.97 m^2 Pt. Status: Room:       259A   ATTENDING    Sainani, Elloree RDCS  PERFORMING   [facility]  ADMITTING    Pyreddy, Pavan  ORDERING     Pyreddy, Pavan  REFERRING    Pyreddy, Pavan  cc:  ------------------------------------------------------------------- LV EF: 30% -   35%  ------------------------------------------------------------------- Indications:  Elevated troponin.  ------------------------------------------------------------------- History:   PMH:  Hyperlipidemia, acquired hypothyroidism, pneumonia.  Risk factors:  Hypertension. Diabetes mellitus.  ------------------------------------------------------------------- Study Conclusions  - Left ventricle: The cavity size was moderately dilated. Systolic   function was moderately to severely reduced. The estimated   ejection fraction was in the range of 30% to 35%. Akinesis of the   apical myocardium. - Aortic valve: There was mild regurgitation. Valve area (Vmax):   2.99 cm^2. - Mitral valve: There was mild regurgitation.  ------------------------------------------------------------------- Study data:   Study status:  Routine.  Procedure:  The patient reported no pain pre or post test. Transthoracic echocardiography. Image quality was adequate.          Transthoracic echocardiography.  M-mode, complete 2D, spectral Doppler, and color Doppler.  Birthdate:  Patient birthdate: 1961/08/14.  Age:  Patient is 56 yr old.  Sex:  Gender: male.    BMI: 26.6 kg/m^2.  Blood pressure:     131/80  Patient status:  Inpatient.  Study date: Study date: 03/16/2017. Study time: 08:19 AM.   Location:  ICU/CCU   -------------------------------------------------------------------  ------------------------------------------------------------------- Left ventricle:  The cavity size was moderately dilated. Systolic function was moderately to severely reduced. The estimated ejection fraction was in the range of 30% to 35%.  Regional wall motion abnormalities:   Akinesis of the apical myocardium.  ------------------------------------------------------------------- Aortic valve:   Doppler:  There was mild regurgitation.    Peak velocity ratio of LVOT to aortic valve: 0.87. Valve area (Vmax): 2.99 cm^2. Indexed valve area (Vmax): 1.52 cm^2/m^2.    Peak gradient (S): 3 mm Hg.  ------------------------------------------------------------------- Mitral valve:   Doppler:  There was mild regurgitation.    Valve area by pressure half-time: 5.5 cm^2. Indexed valve area by pressure half-time: 2.8 cm^2/m^2.  ------------------------------------------------------------------- Left atrium:  The atrium was at the upper limits of normal in size.   ------------------------------------------------------------------- Tricuspid valve:   Doppler:  There was mild regurgitation.  ------------------------------------------------------------------- Right atrium:  The atrium was at the upper limits of normal in size.  ------------------------------------------------------------------- Pericardium:  The pericardium was normal in appearance. There was no pericardial effusion.  ------------------------------------------------------------------- Measurements   Left ventricle                           Value          Reference  LV ID, ED, PLAX chordal          (H)     54.8  mm       43 - 52  LV ID, ES, PLAX chordal          (H)     46.4  mm       23 - 38  LV fx shortening, PLAX chordal   (L)     15    %        >=29  LV PW thickness, ED                      12.1  mm       ----------  IVS/LV  PW ratio, ED                      0.92           <=1.3  LV ejection fraction, 1-p A4C            29    %        ----------  LV end-diastolic volume, 2-p             126   ml       ----------  LV end-systolic volume, 2-p              101   ml       ----------  LV ejection fraction, 2-p                20    %        ----------  Stroke volume, 2-p                       25    ml       ----------  LV end-diastolic volume/bsa, 2-p         64    ml/m^2   ----------  LV end-systolic volume/bsa, 2-p          51    ml/m^2   ----------  Stroke volume/bsa, 2-p                   12.7  ml/m^2   ----------  LV e&', lateral                           4.57  cm/s     ----------  LV E/e&', lateral                         13.89          ----------  LV e&', medial                            4.68  cm/s     ----------  LV E/e&', medial                          13.57          ----------  LV e&', average                           4.63  cm/s     ----------  LV E/e&', average                         13.73          ----------    Ventricular septum                       Value          Reference  IVS thickness, ED                        11.1  mm       ----------    LVOT                                     Value          Reference  LVOT ID, S                               21    mm       ----------  LVOT area                                3.46  cm^2     ----------  LVOT peak velocity, S                    75.2  cm/s     ----------    Aortic valve                             Value          Reference  Aortic valve peak velocity, S            86.9  cm/s     ----------  Aortic peak gradient, S                  3     mm Hg    ----------  Velocity ratio, peak, LVOT/AV            0.87           ----------  Aortic valve area, peak velocity         2.99  cm^2     ----------  Aortic valve area/bsa, peak              1.52  cm^2/m^2 ----------  velocity    Aorta                                    Value          Reference   Aortic root ID, ED                       31    mm       ----------    Left atrium                              Value          Reference  LA ID, A-P, ES                           40    mm       ----------  LA ID/bsa, A-P                           2.03  cm/m^2   <=2.2  LA volume, S                             48.1  ml       ----------  LA volume/bsa, S                         24.5  ml/m^2   ----------  LA volume, ES, 1-p A4C                   35.4  ml       ----------  LA volume/bsa, ES, 1-p A4C               18    ml/m^2   ----------  LA volume, ES, 1-p A2C                   55.8  ml       ----------  LA volume/bsa, ES, 1-p A2C               28.4  ml/m^2   ----------    Mitral valve                             Value          Reference  Mitral E-wave peak velocity              63.5  cm/s     ----------  Mitral A-wave peak velocity              116   cm/s     ----------  Mitral deceleration time         (L)     137   ms       150 - 230  Mitral pressure half-time                40    ms       ----------  Mitral E/A ratio, peak                   0.7            ----------  Mitral valve area, PHT, DP               5.5   cm^2     ----------  Mitral valve area/bsa, PHT, DP           2.8   cm^2/m^2 ----------    Right atrium                             Value          Reference  RA ID, S-I, ES, A4C                      45.1  mm       34 - 49  RA area, ES, A4C                         16    cm^2     8.3 - 19.5  RA volume, ES, A/L                       45.8  ml       ----------  RA volume/bsa, ES, A/L                   23.3  ml/m^2   ----------    Right ventricle                          Value          Reference  RV ID, ED, PLAX                          34.6  mm       19 - 38  TAPSE  27.2  mm       ----------  RV s&', lateral, S                        12.9  cm/s     ----------    Pulmonic valve                           Value          Reference  Pulmonic valve  peak velocity, S          64.5  cm/s     ----------  Legend: (L)  and  (H)  mark values outside specified reference range.  ------------------------------------------------------------------- Prepared and Electronically Authenticated by  Miquel Dunn, MD 2018-07-20T18:19:26     Assessment / Plan:  Gala Murdoch and examined with video translator. Severe 3 Vessel CAD/Non -STEMI    History of Stage 3A (T3,N2,M0) squamous cell lung CA- will get CTA to re-evaluate Has had chemo and radiation  Possible CABG based on further evaluation   GOLD,WAYNE E, PA-C 03/17/2017 1:34 PM   I have seen and examined the patient and agree with the assessment and plan as outlined above.  Patient is a 56 year old [place] male with no previous cardiac history but risk factors notable for history of type 2 diabetes mellitus, hypertension, hyperlipidemia, and remote history of tobacco abuse who was admitted to [facility] with new onset symptoms of shortness of breath and chest tightness.  EKGs revealed sinus rhythm without acute ST segment changes but poor R-wave progression consistent with likely previous anterior wall myocardial infarction.  Troponin levels were slightly elevated on admission and increased to 1.92.  I personally interviewed the patient with use of video interpreter.  He has remained free of chest pain or shortness of breath since he was transferred to [facility].  He states that he has had intermittent chest pain in the past but never sought medical attention.  He has experienced increasing exertional SOB over the past month.  He lives near [place] with his wife, who is currently out of the country.  He states that he no longer has a primary care physician because his Medicaid insurance was terminated. In addition, his oncologist at the [facility] dismissed him from their practice.    I have personally reviewed the transthoracic  echocardiogram and diagnostic cardiac catheterization performed at [facility]. Echocardiogram demonstrates severe left ventricular systolic dysfunction with ejection fraction estimated 30-35% and mild mitral regurgitation. Cardiac catheterization reveals severe three-vessel coronary artery disease.  His circumstances are further complicated by history of stage IIIa (T3,N2,M0) squamous cell carcinoma on the lung in clinical remission after having previously been evaluated by Dr. Servando Snare and treated with primary chemoradiation therapy in 2012. Chest x-ray performed at the time of admission yesterday reveals right hilar mass that appears stable in comparison with previous imaging studies. He will need to undergo follow-up CT scan and pulmonary function testing before any definitive decisions can be made regarding management of coronary artery disease.  I spent in excess of 60 minutes during the conduct of this hospital encounter and >50% of this time involved direct face-to-face encounter with the patient for counseling and/or coordination of their care.   Rexene Alberts, MD 03/17/2017 3:04 PM

## 2017-03-17 NOTE — Progress Notes (Signed)
ANTICOAGULATION CONSULT NOTE   Pharmacy Consult for heparin drip Indication: chest pain/ACS  No Known Allergies  Patient Measurements: Height: 5\' 3"  (160 cm) Weight: 164 lb 0.4 oz (74.4 kg) IBW/kg (Calculated) : 56.9 Heparin Dosing Weight: 79.4 kg  Vital Signs: Temp: 98.5 F (36.9 C) (07/21 1301) Temp Source: Oral (07/21 1301) BP: 114/81 (07/21 1301) Pulse Rate: 106 (07/21 1301)  Labs:  Recent Labs  03/16/17 0450 03/16/17 0453 03/16/17 0842 03/16/17 1149 03/16/17 1426 03/16/17 2016 03/16/17 2333 03/17/17 0530 03/17/17 1145  HGB 14.5  --   --   --   --   --   --  14.2  --   HCT 44.0  --   --   --   --   --   --  41.3  --   PLT 177  --   --   --   --   --   --  158  --   APTT  --  31  --   --   --   --   --   --   --   LABPROT  --  13.0  --   --   --   --   --   --   --   INR  --  0.98  --   --   --   --   --   --   --   HEPARINUNFRC  --   --   --  0.29*  --   --  0.50  --  0.63  CREATININE 0.89  --   --   --   --   --   --  0.70  --   TROPONINI 0.47*  --  0.82*  --  1.64* 1.92*  --   --   --     Estimated Creatinine Clearance: 93.2 mL/min (by C-G formula based on SCr of 0.7 mg/dL).   Medical History: Past Medical History:  Diagnosis Date  . Acquired hypothyroidism 10/05/2015  . Constipation   . Diabetes mellitus    metfomin  . ED (erectile dysfunction)   . Hepatic steatosis 07/26/11   severe ct chest  . Hyperlipidemia   . Hypertension   . Lung cancer ([place])    lung ca dx5/02/2011  . Neuropathy of finger    mild s/p chemotherapy  . Pneumonia    hx    Medications:  Scheduled:  . [START ON 03/18/2017] aspirin EC  81 mg Oral Daily  . atorvastatin  80 mg Oral q1800  . carvedilol  6.25 mg Oral BID WC  . insulin aspart  0-9 Units Subcutaneous TID WC  . insulin detemir  40 Units Subcutaneous QHS    Assessment: 56 yo male s/p cath at [facility] with 3V CAD.  Transferred to [facility] for surgical consult.  IV heparin restarted after cath yesterday, heparin level at  goal this morning on 1100 units/hr.  No overt bleeding or complications noted.  CBC stable.  Goal of Therapy:  Heparin level 0.3-0.7 units/ml Monitor platelets by anticoagulation protocol: Yes   Plan:  Continue IV heparin at current rate. Daily heparin level and CBC. F/u plans for surgery.  Uvaldo Rising, BCPS  Clinical Pharmacist Pager [phone]  03/17/2017 2:09 PM

## 2017-03-17 NOTE — H&P (Signed)
History & Physical    Patient ID: George Griffin MRN: [id number], DOB/AGE: 03-Jun-1961   Admit date: 03/17/2017  Primary Physician: George Panda, MD Primary Cardiologist: [facility]  Patient Profile    56 year old male with past history of non-insulin-dependent diabetes, hyperlipidemia, hypertension, lung CA who presented with ongoing dyspnea and chest pain.  Past Medical History   Past Medical History:  Diagnosis Date  . Acquired hypothyroidism 10/05/2015  . Constipation   . Diabetes mellitus    metfomin  . ED (erectile dysfunction)   . Hepatic steatosis 07/26/11   severe ct chest  . Hyperlipidemia   . Hypertension   . Lung cancer ([place])    lung ca dx5/02/2011  . Neuropathy of finger    mild s/p chemotherapy  . Pneumonia    hx    Past Surgical History:  Procedure Laterality Date  . none       Allergies  No Known Allergies  History of Present Illness    George Griffin is a 56 yo male with PMH of f non-insulin-dependent diabetes, hyperlipidemia, hypertension, lung CA. Reports he was followed over at [facility] for his Lung Ca but is currently in remission and has been cleared from clinic follow up. States he follows with a PCP. Over the past couple of days developed progressive dyspnea with worsening episode and associated chest pain last 7/19. He presented to the ED and required Bipap initially. Echo there showed new onset of acute systolic HF with EF of 28-78%. Mildly elevated troponin at 1.92. Placed on IV heparin, and underwent LHC with 3v disease noted with [facility]. LDL 100. Other labs stable. He was referred to [facility] for surgery consult to evaluate for possible CABG.   Home Medications    Prior to Admission medications   Medication Sig Start Date End Date Taking? Authorizing Provider  acetaminophen (TYLENOL) 325 MG tablet Take 650 mg by mouth every 6 (six) hours as needed.      [provider]  aspirin EC 81 MG EC tablet Take 1 tablet (81 mg total) by mouth  daily. 03/18/17   George Leber, MD  atorvastatin (LIPITOR) 80 MG tablet Take 1 tablet (80 mg total) by mouth daily at 6 PM. 03/17/17   George Griffin, George Heman, MD  carvedilol (COREG) 3.125 MG tablet Take 1 tablet (3.125 mg total) by mouth 2 (two) times daily with a meal. 03/17/17   [facility], George Heman, MD  heparin 100-0.45 UNIT/ML-% infusion Inject 1,100 Units/hr into the vein continuous. 03/17/17   George Leber, MD  insulin detemir (LEVEMIR) 100 UNIT/ML injection Inject 40 Units into the skin at bedtime.    [provider]  sitaGLIPtan-metformin (JANUMET) 50-1000 MG per tablet Take 1 tablet by mouth 3 (three) times daily before meals.     [provider]    Family History    Family History  Problem Relation Age of Onset  . CAD Neg Hx   . Hypertension Neg Hx   . Diabetes Neg Hx     Social History    Social History   Social History  . Marital status: Married    Spouse name: N/A  . Number of children: N/A  . Years of education: N/A   Occupational History  .  Unemployed   Social History Main Topics  . Smoking status: Former Research scientist (life sciences)  . Smokeless tobacco: Never Used  . Alcohol use No  . Drug use: No  . Sexual activity: Not on file  Other Topics Concern  . Not on file   Social History Narrative  . No narrative on file     Review of Systems    See HPI  All other systems reviewed and are otherwise negative except as noted above.  Physical Exam    Blood pressure 114/81, pulse (!) 106, temperature 98.5 F (36.9 C), temperature source Oral, resp. rate (!) 23, height 5\' 3"  (1.6 m), weight 164 lb 0.4 oz (74.4 kg), SpO2 97 %.  General: Pleasant, older male NAD Psych: Normal affect. Neuro: Alert and oriented X 3. Moves all extremities spontaneously. HEENT: Normal  Neck: Supple without bruits or JVD. Lungs:  Resp regular and unlabored, CTA. Heart: RRR no s3, s4, or murmurs. Abdomen: Soft, non-tender, non-distended, BS + x 4.  Extremities: No clubbing,  cyanosis or edema. DP/PT/Radials 2+ and equal bilaterally.  Labs    Troponin (Point of Care Test) No results for input(s): TROPIPOC in the last 72 hours.  Recent Labs  03/16/17 0450 03/16/17 0842 03/16/17 1426 03/16/17 2016  TROPONINI 0.47* 0.82* 1.64* 1.92*   Lab Results  Component Value Date   WBC 7.5 03/17/2017   HGB 14.2 03/17/2017   HCT 41.3 03/17/2017   MCV 86.9 03/17/2017   PLT 158 03/17/2017     Recent Labs Lab 03/16/17 0450 03/17/17 0530  NA 137 138  K 4.3 4.0  CL 101 104  CO2 25 26  BUN 22* 14  CREATININE 0.89 0.70  CALCIUM 9.0 8.3*  PROT 7.3  --   BILITOT 0.9  --   ALKPHOS 39  --   ALT 29  --   AST 32  --   GLUCOSE 290* 159*   Lab Results  Component Value Date   CHOL 158 03/17/2017   HDL 36 (L) 03/17/2017   LDLCALC 100 (H) 03/17/2017   TRIG 109 03/17/2017   Lab Results  Component Value Date   DDIMER  12/04/2007    <0.22        AT THE INHOUSE ESTABLISHED CUTOFF VALUE OF 0.48 ug/mL FEU, THIS ASSAY HAS BEEN DOCUMENTED IN THE LITERATURE TO HAVE     Radiology Studies    Dg Chest Portable 1 View  Result Date: 03/16/2017 CLINICAL DATA:  Shortness of breath and history of lung cancer EXAM: PORTABLE CHEST 1 VIEW COMPARISON:  Chest CT 04/17/2016 FINDINGS: Right parahilar mass and streaky associated opacities are similar to the appearance on the chest CT of 04/17/2016. There is no other area of focal consolidation. No pulmonary edema. No pneumothorax or pleural effusion. IMPRESSION: Right parahilar mass and associated linear opacities are likely unchanged compared to the 04/17/2016 chest CT when allowing for differences in modality, though it would be difficult to entirely exclude the possibility of superimposed infection. Electronically Signed   By: Ulyses Jarred M.D.   On: 03/16/2017 05:33    ECG & Cardiac Imaging    EKG: ST  TTE: 03/16/17  Study Conclusions  - Left ventricle: The cavity size was moderately dilated. Systolic   function was  moderately to severely reduced. The estimated   ejection fraction was in the range of 30% to 35%. Akinesis of the   apical myocardium. - Aortic valve: There was mild regurgitation. Valve area (Vmax):   2.99 cm^2. - Mitral valve: There was mild regurgitation.  Cath: 03/16/17  Conclusion     Ost Cx to Prox Cx lesion, 75 %stenosed.  Prox LAD to Mid LAD lesion, 95 %stenosed.  Dist LAD lesion, 60 %stenosed.  Ost 1st Diag to 1st Diag lesion, 90 %stenosed.  Mid RCA lesion, 80 %stenosed.  Dist RCA lesion, 95 %stenosed.  Ost RPDA to RPDA lesion, 75 %stenosed.  2nd RPLB lesion, 75 %stenosed.  RPDA lesion, 90 %stenosed.   1. Severe three-vessel coronary artery disease with current crit high-grade stenosis mid LAD, 80% stenosis mid RCA with diffuse distal involvement, irregular 75% stenosis proximal left circumflex 2. Severe dilated cardiomyopathy  Recommendations  1. CABG 2. Probable ICD   Assessment & Plan    56 year old male with past history of non-insulin-dependent diabetes, hyperlipidemia, hypertension, lung CA who presented with ongoing dyspnea and chest pain.  1. NSTEMI: Underwent LHC with George Griffin noted above with 3v disease. Transferred to [facility] for surgical consult. No current chest pain.  -- IV heparin -- BB (tachy- titrate coreg to 6.25mg  BID), ASA, statin  2. HTN: Stable on current therapy  3. HL: on statin -- LDL 100  4. NIDDM: SSI -- check Hgb A1c  5. Lung Ca: previously seen by Dr. Alvy Bimler. Currently in remission from treatment  Signed, Reino Bellis, NP-C Pager [phone] 03/17/2017, 1:53 PM   Attending Note:   The patient was seen and examined.  Agree with assessment and plan as noted above.  Changes made to the above note as needed.  Patient seen and independently examined with Reino Bellis, NP .   We discussed all aspects of the encounter. I agree with the assessment and plan as stated above.  We used the remote iPAD translator  service.   1. CAD:   Pt was seen at [facility] by George Griffin. Cath reveals 3 V CAD and mod - severe reduced LV function . Transferred to [facility] to be seen by [facility] for consideration for CABG  Pt is pain free at present   Will continue current meds Will continue to diurese   I have spent a total of 40 minutes with patient reviewing hospital  notes , telemetry, EKGs, labs and examining patient as well as establishing an assessment and plan that was discussed with the patient. > 50% of time was spent in direct patient care.    Thayer Headings, Brooke Bonito., MD, [facility] 03/17/2017, 4:04 PM [address]  [place] Pager [phone]

## 2017-03-18 DIAGNOSIS — I5021 Acute systolic (congestive) heart failure: Secondary | ICD-10-CM | POA: Diagnosis present

## 2017-03-18 LAB — BLOOD GAS, ARTERIAL
Acid-Base Excess: 0.3 mmol/L (ref 0.0–2.0)
BICARBONATE: 24.1 mmol/L (ref 20.0–28.0)
DRAWN BY: 437071
O2 CONTENT: 2 L/min
O2 SAT: 90.3 %
PATIENT TEMPERATURE: 99.2
pCO2 arterial: 37.5 mmHg (ref 32.0–48.0)
pH, Arterial: 7.425 (ref 7.350–7.450)
pO2, Arterial: 60.5 mmHg — ABNORMAL LOW (ref 83.0–108.0)

## 2017-03-18 LAB — COMPREHENSIVE METABOLIC PANEL
ALBUMIN: 3.2 g/dL — AB (ref 3.5–5.0)
ALK PHOS: 37 U/L — AB (ref 38–126)
ALT: 20 U/L (ref 17–63)
ANION GAP: 8 (ref 5–15)
AST: 21 U/L (ref 15–41)
BILIRUBIN TOTAL: 0.8 mg/dL (ref 0.3–1.2)
BUN: 13 mg/dL (ref 6–20)
CALCIUM: 8.6 mg/dL — AB (ref 8.9–10.3)
CO2: 24 mmol/L (ref 22–32)
Chloride: 105 mmol/L (ref 101–111)
Creatinine, Ser: 0.76 mg/dL (ref 0.61–1.24)
GFR calc non Af Amer: >60 mL/min (ref >60)
Glucose, Bld: 257 mg/dL — ABNORMAL HIGH (ref 65–99)
POTASSIUM: 4.1 mmol/L (ref 3.5–5.1)
SODIUM: 137 mmol/L (ref 135–145)
TOTAL PROTEIN: 6.5 g/dL (ref 6.5–8.1)

## 2017-03-18 LAB — PROTIME-INR
INR: 0.97
PROTHROMBIN TIME: 12.8 s (ref 11.4–15.2)

## 2017-03-18 LAB — LIPID PANEL
CHOL/HDL RATIO: 4.4 ratio
CHOLESTEROL: 140 mg/dL (ref 0–200)
HDL: 32 mg/dL — ABNORMAL LOW (ref >40)
LDL CALC: 71 mg/dL (ref 0–99)
Triglycerides: 183 mg/dL — ABNORMAL HIGH (ref <150)
VLDL: 37 mg/dL (ref 0–40)

## 2017-03-18 LAB — PREALBUMIN: PREALBUMIN: 20.4 mg/dL (ref 18–38)

## 2017-03-18 LAB — CBC
HEMATOCRIT: 40.3 % (ref 39.0–52.0)
HEMOGLOBIN: 13.7 g/dL (ref 13.0–17.0)
MCH: 29.1 pg (ref 26.0–34.0)
MCHC: 34 g/dL (ref 30.0–36.0)
MCV: 85.6 fL (ref 78.0–100.0)
Platelets: 164 10*3/uL (ref 150–400)
RBC: 4.71 MIL/uL (ref 4.22–5.81)
RDW: 13 % (ref 11.5–15.5)
WBC: 6.8 10*3/uL (ref 4.0–10.5)

## 2017-03-18 LAB — APTT: aPTT: 80 seconds — ABNORMAL HIGH (ref 24–36)

## 2017-03-18 LAB — GLUCOSE, CAPILLARY
GLUCOSE-CAPILLARY: 390 mg/dL — AB (ref 65–99)
Glucose-Capillary: 214 mg/dL — ABNORMAL HIGH (ref 65–99)
Glucose-Capillary: 287 mg/dL — ABNORMAL HIGH (ref 65–99)
Glucose-Capillary: 250 mg/dL — ABNORMAL HIGH (ref 65–99)

## 2017-03-18 LAB — HEPARIN LEVEL (UNFRACTIONATED): HEPARIN UNFRACTIONATED: 0.3 [IU]/mL (ref 0.30–0.70)

## 2017-03-18 MED ORDER — ASPIRIN 81 MG PO CHEW
CHEWABLE_TABLET | ORAL | Status: AC
  Administered 2017-03-18: 81 mg
  Filled 2017-03-18: qty 1

## 2017-03-18 NOTE — Progress Notes (Signed)
ANTICOAGULATION CONSULT NOTE   Pharmacy Consult for heparin drip Indication: chest pain/ACS  No Known Allergies  Patient Measurements: Height: 5\' 3"  (160 cm) Weight: 164 lb 0.4 oz (74.4 kg) IBW/kg (Calculated) : 56.9 Heparin Dosing Weight: 79.4 kg  Vital Signs: Temp: 97.9 F (36.6 C) (07/22 0809) Temp Source: Oral (07/22 0809) BP: 95/71 (07/22 0809) Pulse Rate: 100 (07/22 0809)  Labs:  Recent Labs  03/16/17 0450 03/16/17 0453 03/16/17 0842  03/16/17 1426 03/16/17 2016 03/16/17 2333 03/17/17 0530 03/17/17 1145 03/18/17 0428  HGB 14.5  --   --   --   --   --   --  14.2  --  13.7  HCT 44.0  --   --   --   --   --   --  41.3  --  40.3  PLT 177  --   --   --   --   --   --  158  --  164  APTT  --  31  --   --   --   --   --   --   --  80*  LABPROT  --  13.0  --   --   --   --   --   --   --  12.8  INR  --  0.98  --   --   --   --   --   --   --  0.97  HEPARINUNFRC  --   --   --   < >  --   --  0.50  --  0.63 0.30  CREATININE 0.89  --   --   --   --   --   --  0.70  --  0.76  TROPONINI 0.47*  --  0.82*  --  1.64* 1.92*  --   --   --   --   < > = values in this interval not displayed.  Estimated Creatinine Clearance: 93.2 mL/min (by C-G formula based on SCr of 0.76 mg/dL).   Medical History: Past Medical History:  Diagnosis Date  . Acquired hypothyroidism 10/05/2015  . CAD (coronary artery disease), native coronary artery 03/17/2017  . Constipation   . Diabetes mellitus    metfomin  . ED (erectile dysfunction)   . Hepatic steatosis 07/26/11   severe ct chest  . Hyperlipidemia   . Hypertension   . Ischemic cardiomyopathy 03/17/2017  . Lung cancer ([place])    lung ca dx5/02/2011  . Neuropathy of finger    mild s/p chemotherapy  . Non-STEMI (non-ST elevated myocardial infarction) ([place]) 03/16/2017  . Pneumonia    hx    Medications:  Scheduled:  . aspirin EC  81 mg Oral Daily  . atorvastatin  80 mg Oral q1800  . carvedilol  6.25 mg Oral BID WC  . insulin aspart   0-9 Units Subcutaneous TID WC  . insulin detemir  40 Units Subcutaneous QHS  . mouth rinse  15 mL Mouth Rinse BID    Assessment: 56 yo male s/p cath at [facility] with 3V CAD.  Transferred to [facility] for surgical consult.  IV heparin restarted after cath, heparin level at goal this morning on 1100 units/hr.  No overt bleeding or complications noted.  CBC stable.  Awaiting decision on CABG.  Goal of Therapy:  Heparin level 0.3-0.7 units/ml Monitor platelets by anticoagulation protocol: Yes   Plan:  Continue IV heparin at current rate. Daily heparin level and CBC.  F/u plans for surgery.  Uvaldo Rising, BCPS  Clinical Pharmacist Pager [phone]  03/18/2017 9:53 AM

## 2017-03-18 NOTE — Progress Notes (Signed)
Progress Note  Patient Name: George Griffin Date of Encounter: 03/18/2017  Primary Cardiologist: Paraschos  Subjective   56 yo man with recent chest squeezing and worsening dyspnea. Found to have 3 V cad Hx of squamous cell  lung cancer in 2012.   Hx of DM type II, HTN, hyperlipidemia   Inpatient Medications    Scheduled Meds: . aspirin EC  81 mg Oral Daily  . atorvastatin  80 mg Oral q1800  . carvedilol  6.25 mg Oral BID WC  . insulin aspart  0-9 Units Subcutaneous TID WC  . insulin detemir  40 Units Subcutaneous QHS  . mouth rinse  15 mL Mouth Rinse BID   Continuous Infusions: . heparin 1,100 Units/hr (03/17/17 1637)   PRN Meds: acetaminophen, nitroGLYCERIN, ondansetron (ZOFRAN) IV   Vital Signs    Vitals:   03/17/17 1949 03/17/17 2333 03/18/17 0322 03/18/17 0809  BP: 106/67 100/64 (!) 93/53 95/71  Pulse: (!) 102 (!) 104 97 100  Resp: (!) 23 (!) 21 18 (!) 23  Temp: 99 F (37.2 C) 99.2 F (37.3 C) 98.5 F (36.9 C) 97.9 F (36.6 C)  TempSrc: Oral Oral Oral Oral  SpO2: 97% 98% 94% 99%  Weight:      Height:        Intake/Output Summary (Last 24 hours) at 03/18/17 0857 Last data filed at 03/18/17 0400  Gross per 24 hour  Intake           270.88 ml  Output             2100 ml  Net         -1829.12 ml   Filed Weights   03/17/17 1301  Weight: 164 lb 0.4 oz (74.4 kg)    Telemetry    NSR  - Personally Reviewed  ECG     NSR  - Personally Reviewed  Physical Exam   GEN: No acute distress.   Neck: No JVD Cardiac: RRR, no murmurs, rubs, or gallops.  Respiratory: Clear to auscultation bilaterally. GI: Soft, nontender, non-distended  MS: No edema; No deformity. Neuro:  Nonfocal  Psych: Normal affect   Labs    Chemistry Recent Labs Lab 03/16/17 0450 03/17/17 0530 03/18/17 0428  NA 137 138 137  K 4.3 4.0 4.1  CL 101 104 105  CO2 25 26 24   GLUCOSE 290* 159* 257*  BUN 22* 14 13  CREATININE 0.89 0.70 0.76  CALCIUM 9.0 8.3* 8.6*  PROT 7.3  --   6.5  ALBUMIN 4.1  --  3.2*  AST 32  --  21  ALT 29  --  20  ALKPHOS 39  --  37*  BILITOT 0.9  --  0.8  GFRNONAA >60 >60 >60  GFRAA >60 >60 >60  ANIONGAP 11 8 8      Hematology Recent Labs Lab 03/16/17 0450 03/17/17 0530 03/18/17 0428  WBC 10.4 7.5 6.8  RBC 5.03 4.76 4.71  HGB 14.5 14.2 13.7  HCT 44.0 41.3 40.3  MCV 87.6 86.9 85.6  MCH 28.9 29.9 29.1  MCHC 33.0 34.4 34.0  RDW 14.4 14.1 13.0  PLT 177 158 164    Cardiac Enzymes Recent Labs Lab 03/16/17 0450 03/16/17 0842 03/16/17 1426 03/16/17 2016  TROPONINI 0.47* 0.82* 1.64* 1.92*   No results for input(s): TROPIPOC in the last 168 hours.   BNP Recent Labs Lab 03/16/17 0450  BNP 240.0*     DDimer No results for input(s): DDIMER in the last 168  hours.   Radiology    Ct Angio Chest Pe W Or Wo Contrast  Result Date: 03/17/2017 CLINICAL DATA:  Dyspnea. History of lung cancer post chemotherapy and radiation. EXAM: CT ANGIOGRAPHY CHEST WITH CONTRAST TECHNIQUE: Multidetector CT imaging of the chest was performed using the standard protocol during bolus administration of intravenous contrast. Multiplanar CT image reconstructions and MIPs were obtained to evaluate the vascular anatomy. CONTRAST:  100 cc Isovue 370 IV COMPARISON:  CXR 03/24/2017 and chest CT 04/17/2016 FINDINGS: Cardiovascular: Heart size is normal without pericardial effusion. There is coronary arteriosclerosis aortic atherosclerosis. No aneurysm. Satisfactory opacification of the pulmonary arterial system to the subsegmental level. No pulmonary embolus. Mediastinum/Nodes: Right distal paraesophageal 10 mm short axis lymph node is slightly larger than 5 mm seen on the 2017 comparison. No mediastinal adenopathy. Lungs/Pleura: Paraseptal emphysema with chronic right perihilar airway thickening and parenchymal hilar opacities likely representing chronic post therapy change. As there does appear to be more soft tissue consolidation, a new superimposed pneumonia is  not entirely excluded. Moderate bilateral pleural effusions are seen with adjacent compressive atelectasis. Ground-glass bilateral perihilar opacities may reflect changes of pulmonary edema given the pleural effusions present. Upper Abdomen: No acute abnormality. Musculoskeletal: Thoracic spondylosis. No acute lytic or blastic disease. Review of the MIP images confirms the above findings. IMPRESSION: 1. New moderate bilateral pleural effusions with compressive atelectasis and ground-glass perihilar airspace opacities suspicious for stigmata of pulmonary edema. Superimposed atypical infection, alveolitis or pneumonitis is not entirely excluded. 2. Chronic post therapy change involving the hila with peribronchial thickening and consolidations noted. Slightly more confluent soft tissue consolidation about the right hilum since prior exam may represent a new superimposed pneumonia. 3. Slight increase in distal paraesophageal lymph node size not 10 mm short axis versus 5 mm previously. 4. No acute pulmonary embolus. 5. Coronary arteriosclerosis aortic atherosclerosis. Aortic Atherosclerosis (ICD10-I70.0) and Emphysema (ICD10-J43.9). Electronically Signed   By: Ashley Royalty M.D.   On: 03/17/2017 19:45    Cardiac Studies     Patient Profile     56 y.o. male with CAD, acute systolic CHF, hx of lung cancer  Assessment & Plan    1.   CAD :   Has been seen by Dr. Roxy Manns. Stable for now.  No CP  contine atorvastatin 80 mg a day   2.  Acute systolic chf:   Getting diuresed with lasix -  I/O - 1.8 liters yesterday  Coreg 6.25      Signed, Mertie Moores, MD  03/18/2017, 8:57 AM

## 2017-03-18 NOTE — Progress Notes (Signed)
[address]       [place] [address]             [phone]     CARDIOTHORACIC SURGERY PROGRESS NOTE  Subjective: No chest pain.  Still c/o dyspnea  Objective: Vital signs in last 24 hours: Temp:  [97.9 F (36.6 C)-99.2 F (37.3 C)] 97.9 F (36.6 C) (07/22 0809) Pulse Rate:  [97-128] 100 (07/22 0809) Cardiac Rhythm: Sinus tachycardia (07/21 1956) Resp:  [17-23] 23 (07/22 0809) BP: (93-114)/(53-81) 95/71 (07/22 0809) SpO2:  [94 %-99 %] 99 % (07/22 0809) FiO2 (%):  [28 %] 28 % (07/21 1357) Weight:  [164 lb 0.4 oz (74.4 kg)] 164 lb 0.4 oz (74.4 kg) (07/21 1301)  Physical Exam:  Rhythm:   sinus  Breath sounds: Fairly clear  Heart sounds:  RRR  Incisions:  n/a  Abdomen:  soft  Extremities:  warm   Intake/Output from previous day: 07/21 0701 - 07/22 0700 In: 270.9 [P.O.:120; I.V.:150.9] Out: 2100 [Urine:2100] Intake/Output this shift: Total I/O In: 240 [P.O.:240] Out: 1 [Stool:1]  Lab Results:  CBC: Recent Labs  03/17/17 0530 03/18/17 0428  WBC 7.5 6.8  HGB 14.2 13.7  HCT 41.3 40.3  PLT 158 164    BMET:  Recent Labs  03/17/17 0530 03/18/17 0428  NA 138 137  K 4.0 4.1  CL 104 105  CO2 26 24  GLUCOSE 159* 257*  BUN 14 13  CREATININE 0.70 0.76  CALCIUM 8.3* 8.6*     PT/INR:   Recent Labs  03/18/17 0428  LABPROT 12.8  INR 0.97    CBG (last 3)   Recent Labs  03/17/17 1616 03/17/17 2105 03/18/17 0806  GLUCAP 336* 216* 214*    ABG    Component Value Date/Time   PHART 7.425 03/18/2017 0255   PCO2ART 37.5 03/18/2017 0255   PO2ART 60.5 (L) 03/18/2017 0255   HCO3 24.1 03/18/2017 0255   TCO2 27 12/04/2007 1742   O2SAT 90.3 03/18/2017 0255    CT: CT ANGIOGRAPHY CHEST WITH CONTRAST  TECHNIQUE: Multidetector CT imaging of the chest was performed using the standard protocol during bolus administration of intravenous contrast. Multiplanar CT image reconstructions and MIPs were obtained to evaluate the  vascular anatomy.  CONTRAST:  100 cc Isovue 370 IV  COMPARISON:  CXR 03/24/2017 and chest CT 04/17/2016  FINDINGS: Cardiovascular: Heart size is normal without pericardial effusion. There is coronary arteriosclerosis aortic atherosclerosis. No aneurysm. Satisfactory opacification of the pulmonary arterial system to the subsegmental level. No pulmonary embolus.  Mediastinum/Nodes: Right distal paraesophageal 10 mm short axis lymph node is slightly larger than 5 mm seen on the 2017 comparison. No mediastinal adenopathy.  Lungs/Pleura: Paraseptal emphysema with chronic right perihilar airway thickening and parenchymal hilar opacities likely representing chronic post therapy change. As there does appear to be more soft tissue consolidation, a new superimposed pneumonia is not entirely excluded. Moderate bilateral pleural effusions are seen with adjacent compressive atelectasis. Ground-glass bilateral perihilar opacities may reflect changes of pulmonary edema given the pleural effusions present.  Upper Abdomen: No acute abnormality.  Musculoskeletal: Thoracic spondylosis. No acute lytic or blastic disease.  Review of the MIP images confirms the above findings.  IMPRESSION: 1. New moderate bilateral pleural effusions with compressive atelectasis and ground-glass perihilar airspace opacities suspicious for stigmata of pulmonary edema. Superimposed atypical infection, alveolitis or pneumonitis is not entirely excluded. 2. Chronic post therapy change involving the hila with peribronchial thickening and consolidations noted. Slightly more  confluent soft tissue consolidation about the right hilum since prior exam may represent a new superimposed pneumonia. 3. Slight increase in distal paraesophageal lymph node size not 10 mm short axis versus 5 mm previously. 4. No acute pulmonary embolus. 5. Coronary arteriosclerosis aortic atherosclerosis.  Aortic Atherosclerosis  (ICD10-I70.0) and Emphysema (ICD10-J43.9).   Electronically Signed   By: Ashley Royalty M.D.   On: 03/17/2017 19:45   Assessment/Plan:  Clinically stable w/ no chest pain and diuresing fairly well but still c/o dyspnea and marginal oxygenation on ABG.  Results of CTA reviewed.  Await results of PFT's.  Will follow.  I spent in excess of 15 minutes during the conduct of this hospital encounter and >50% of this time involved direct face-to-face encounter with the patient for counseling and/or coordination of their care.          Rexene Alberts, MD 03/18/2017 9:20 AM

## 2017-03-19 ENCOUNTER — Inpatient Hospital Stay (HOSPITAL_COMMUNITY): Payer: Medicare Other

## 2017-03-19 ENCOUNTER — Encounter: Payer: Self-pay | Admitting: Cardiology

## 2017-03-19 ENCOUNTER — Other Ambulatory Visit: Payer: Self-pay | Admitting: *Deleted

## 2017-03-19 DIAGNOSIS — I25118 Atherosclerotic heart disease of native coronary artery with other forms of angina pectoris: Secondary | ICD-10-CM

## 2017-03-19 DIAGNOSIS — Z794 Long term (current) use of insulin: Secondary | ICD-10-CM

## 2017-03-19 DIAGNOSIS — E118 Type 2 diabetes mellitus with unspecified complications: Secondary | ICD-10-CM

## 2017-03-19 DIAGNOSIS — I5021 Acute systolic (congestive) heart failure: Secondary | ICD-10-CM

## 2017-03-19 LAB — GLUCOSE, CAPILLARY
GLUCOSE-CAPILLARY: 272 mg/dL — AB (ref 65–99)
GLUCOSE-CAPILLARY: 239 mg/dL — AB (ref 65–99)
GLUCOSE-CAPILLARY: 294 mg/dL — AB (ref 65–99)
Glucose-Capillary: 173 mg/dL — ABNORMAL HIGH (ref 65–99)
Glucose-Capillary: 267 mg/dL — ABNORMAL HIGH (ref 65–99)
Glucose-Capillary: 272 mg/dL — ABNORMAL HIGH (ref 65–99)

## 2017-03-19 LAB — CBC
HEMATOCRIT: 41.8 % (ref 39.0–52.0)
Hemoglobin: 14.2 g/dL (ref 13.0–17.0)
MCH: 28.9 pg (ref 26.0–34.0)
MCHC: 34 g/dL (ref 30.0–36.0)
MCV: 85.1 fL (ref 78.0–100.0)
Platelets: 171 10*3/uL (ref 150–400)
RBC: 4.91 MIL/uL (ref 4.22–5.81)
RDW: 12.9 % (ref 11.5–15.5)
WBC: 8.6 10*3/uL (ref 4.0–10.5)

## 2017-03-19 LAB — SURGICAL PCR SCREEN
MRSA, PCR: NEGATIVE
Staphylococcus aureus: POSITIVE — AB

## 2017-03-19 LAB — HEPARIN LEVEL (UNFRACTIONATED)
HEPARIN UNFRACTIONATED: 0.38 [IU]/mL (ref 0.30–0.70)
Heparin Unfractionated: 0.24 IU/mL — ABNORMAL LOW (ref 0.30–0.70)
Heparin Unfractionated: 0.1 IU/mL — ABNORMAL LOW (ref 0.30–0.70)

## 2017-03-19 MED ORDER — INSULIN DETEMIR 100 UNIT/ML ~~LOC~~ SOLN
25.0000 [IU] | Freq: Two times a day (BID) | SUBCUTANEOUS | Status: DC
  Administered 2017-03-19 – 2017-03-20 (×3): 25 [IU] via SUBCUTANEOUS
  Filled 2017-03-19 (×4): qty 0.25

## 2017-03-19 MED ORDER — INSULIN ASPART 100 UNIT/ML ~~LOC~~ SOLN
0.0000 [IU] | Freq: Every day | SUBCUTANEOUS | Status: DC
  Administered 2017-03-19: 3 [IU] via SUBCUTANEOUS
  Administered 2017-03-20: 2 [IU] via SUBCUTANEOUS
  Administered 2017-03-21: 3 [IU] via SUBCUTANEOUS
  Administered 2017-03-22: 4 [IU] via SUBCUTANEOUS

## 2017-03-19 MED ORDER — IOPAMIDOL (ISOVUE-300) INJECTION 61%
INTRAVENOUS | Status: AC
  Administered 2017-03-19: 100 mL
  Filled 2017-03-19: qty 100

## 2017-03-19 MED ORDER — INSULIN ASPART 100 UNIT/ML ~~LOC~~ SOLN
5.0000 [IU] | Freq: Three times a day (TID) | SUBCUTANEOUS | Status: DC
  Administered 2017-03-20 (×3): 5 [IU] via SUBCUTANEOUS

## 2017-03-19 MED ORDER — GLUCERNA SHAKE PO LIQD
237.0000 mL | Freq: Three times a day (TID) | ORAL | Status: DC
  Administered 2017-03-19 – 2017-03-22 (×10): 237 mL via ORAL

## 2017-03-19 MED ORDER — INSULIN ASPART 100 UNIT/ML ~~LOC~~ SOLN
0.0000 [IU] | Freq: Three times a day (TID) | SUBCUTANEOUS | Status: DC
  Administered 2017-03-19 (×2): 8 [IU] via SUBCUTANEOUS
  Administered 2017-03-20: 3 [IU] via SUBCUTANEOUS
  Administered 2017-03-20: 5 [IU] via SUBCUTANEOUS
  Administered 2017-03-20: 8 [IU] via SUBCUTANEOUS
  Administered 2017-03-21: 3 [IU] via SUBCUTANEOUS
  Administered 2017-03-21 (×2): 5 [IU] via SUBCUTANEOUS
  Administered 2017-03-22: 15 [IU] via SUBCUTANEOUS
  Administered 2017-03-22 (×2): 5 [IU] via SUBCUTANEOUS
  Administered 2017-03-23: 8 [IU] via SUBCUTANEOUS

## 2017-03-19 MED ORDER — ISOSORBIDE MONONITRATE ER 30 MG PO TB24
30.0000 mg | ORAL_TABLET | Freq: Every day | ORAL | Status: DC
  Administered 2017-03-19 – 2017-03-23 (×5): 30 mg via ORAL
  Filled 2017-03-19 (×5): qty 1

## 2017-03-19 MED ORDER — CARVEDILOL 6.25 MG PO TABS
9.3750 mg | ORAL_TABLET | Freq: Two times a day (BID) | ORAL | Status: AC
  Administered 2017-03-19 – 2017-03-22 (×6): 9.375 mg via ORAL
  Filled 2017-03-19 (×6): qty 1

## 2017-03-19 NOTE — Progress Notes (Signed)
ANTICOAGULATION CONSULT NOTE   Pharmacy Consult for heparin drip Indication: chest pain/ACS  No Known Allergies  Patient Measurements: Height: 5\' 3"  (160 cm) Weight: 164 lb 0.4 oz (74.4 kg) IBW/kg (Calculated) : 56.9 Heparin Dosing Weight: 79.4 kg  Vital Signs: Temp: 98.6 F (37 C) (07/23 2014) Temp Source: Oral (07/23 2014) BP: 96/59 (07/23 2014) Pulse Rate: 97 (07/23 2014)  Labs:  Recent Labs  03/17/17 0530  03/18/17 0428 03/19/17 0222 03/19/17 1216 03/19/17 2019  HGB 14.2  --  13.7 14.2  --   --   HCT 41.3  --  40.3 41.8  --   --   PLT 158  --  164 171  --   --   APTT  --   --  80*  --   --   --   LABPROT  --   --  12.8  --   --   --   INR  --   --  0.97  --   --   --   HEPARINUNFRC  --   < > 0.30 0.24* 0.10* 0.38  CREATININE 0.70  --  0.76  --   --   --   < > = values in this interval not displayed.  Estimated Creatinine Clearance: 93.2 mL/min (by C-G formula based on SCr of 0.76 mg/dL).   Medical History: Past Medical History:  Diagnosis Date  . Acquired hypothyroidism 10/05/2015  . CAD (coronary artery disease), native coronary artery 03/17/2017  . Constipation   . Diabetes mellitus    metfomin  . ED (erectile dysfunction)   . Hepatic steatosis 07/26/11   severe ct chest  . Hyperlipidemia   . Hypertension   . Ischemic cardiomyopathy 03/17/2017  . Lung cancer ([place])    lung ca dx5/02/2011  . Neuropathy of finger    mild s/p chemotherapy  . Non-STEMI (non-ST elevated myocardial infarction) ([place]) 03/16/2017  . Pneumonia    hx    Medications:  Scheduled:  . aspirin EC  81 mg Oral Daily  . atorvastatin  80 mg Oral q1800  . carvedilol  9.375 mg Oral BID WC  . feeding supplement (GLUCERNA SHAKE)  237 mL Oral TID BM  . insulin aspart  0-15 Units Subcutaneous TID WC  . insulin aspart  0-5 Units Subcutaneous QHS  . insulin aspart  5 Units Subcutaneous TID WC  . insulin detemir  25 Units Subcutaneous BID  . isosorbide mononitrate  30 mg Oral Daily  .  mouth rinse  15 mL Mouth Rinse BID    Assessment: 56 yo male s/p cath at [facility] with 3V CAD.  Transferred to [facility] for surgical consult.  IV heparin restarted after cath, heparin level 0.1 despite heaprin drip rate increase this am to 1200 uts/hr.  No problem with IV site or line.  No overt bleeding or complications noted.  CBC stable.  PM f/u > heparin level now at goal on heparin gtt at 1400 units/hr.   Goal of Therapy:  Heparin level 0.3-0.7 units/ml Monitor platelets by anticoagulation protocol: Yes   Plan:  Continue IV heparin at current rate. Confirm heparin level with AM labs. Daily heparin level and CBC. F/u plans for surgery.  Uvaldo Rising, BCPS  Clinical Pharmacist Pager [phone]  03/19/2017 9:20 PM

## 2017-03-19 NOTE — Care Management Note (Addendum)
Case Management Note  Patient Details  Name: George Griffin MRN: [id number] Date of Birth: [birth year]  Subjective/Objective:  Transfer from [facility], from home with family , he speaks [place], and little [place], NCM used the [employer], patient does not have insurance and no PCP, he would like to go to the [facility] for a follow up, he will need ast with medications at discharge.  If he is discharge during the week he can use the [facility] clinic, if he is discharged over the weekend he will need to use the [facility].   7/30 [place], BSN - POD 3 CABG, IV k runs x 3, milrinone drip, amio changed to po today, conts on iv lasix. NCM called to schedule apt at [facility] clinic, they are out of power right now and do not plan to be back up with power til around 12 noon today.  NCM did speak with [facility] cinic they had no apts, was told by rep at [facility] clinic  to make apt at [facility].  NCM called back today and got apt with [facility] on 8/6 at 2:30 for follow up.   Discussed in LOS on 7/31, appropriate for continued stay.                Action/Plan: NCM will follow for dc needs.   Expected Discharge Date:                  Expected Discharge Plan:     In-House Referral:     Discharge planning Services  CM Consult  Post Acute Care Choice:    Choice offered to:     DME Arranged:    DME Agency:     HH Arranged:    HH Agency:     Status of Service:  In process, will continue to follow  If discussed at Long Length of Stay Meetings, dates discussed:    Additional Comments:  Zenon Mayo, RN 03/19/2017, 4:48 PM

## 2017-03-19 NOTE — Progress Notes (Signed)
  Subjective: No chest pain PFTs still pending With severe LV dysfx, symptoms of CHF, no LVEDP at cath [facility] and hx thoracic high dose radiation he needs RHC pre- CABG  Objective: Vital signs in last 24 hours: Temp:  [97.9 F (36.6 C)-99.3 F (37.4 C)] 99.3 F (37.4 C) (07/23 0725) Pulse Rate:  [94-98] 95 (07/23 0725) Cardiac Rhythm: Normal sinus rhythm (07/23 0815) Resp:  [17-20] 17 (07/23 0725) BP: (100-126)/(49-83) 121/83 (07/23 0725) SpO2:  [95 %-100 %] 98 % (07/23 0725)  Hemodynamic parameters for last 24 hours:  stable  Intake/Output from previous day: 07/22 0701 - 07/23 0700 In: 1335.6 [P.O.:1080; I.V.:255.6] Out: 1501 [Urine:1500; Stool:1] Intake/Output this shift: Total I/O In: 240 [P.O.:240] Out: -   O2 sat 94% room air Breathing comfortably on room air  Lab Results:  Recent Labs  03/18/17 0428 03/19/17 0222  WBC 6.8 8.6  HGB 13.7 14.2  HCT 40.3 41.8  PLT 164 171   BMET:  Recent Labs  03/17/17 0530 03/18/17 0428  NA 138 137  K 4.0 4.1  CL 104 105  CO2 26 24  GLUCOSE 159* 257*  BUN 14 13  CREATININE 0.70 0.76  CALCIUM 8.3* 8.6*    PT/INR:  Recent Labs  03/18/17 0428  LABPROT 12.8  INR 0.97   ABG    Component Value Date/Time   PHART 7.425 03/18/2017 0255   HCO3 24.1 03/18/2017 0255   TCO2 27 12/04/2007 1742   O2SAT 90.3 03/18/2017 0255   CBG (last 3)   Recent Labs  03/19/17 0547 03/19/17 0724 03/19/17 1141  GLUCAP 272* 239* 294*    Assessment/Plan: S/P cath for non sremi eval for possible CABG on going    LOS: 2 days    George Griffin 03/19/2017

## 2017-03-19 NOTE — Progress Notes (Signed)
Initial Nutrition Assessment  DOCUMENTATION CODES:   Not applicable  INTERVENTION:   -Glucerna Shake po TID, each supplement provides 220 kcal and 10 grams of protein  NUTRITION DIAGNOSIS:   Increased nutrient needs related to chronic illness (CAD) as evidenced by estimated needs.  GOAL:   Patient will meet greater than or equal to 90% of their needs  MONITOR:   PO intake, Supplement acceptance, Labs, Weight trends, Skin, I & O's  REASON FOR ASSESSMENT:   Malnutrition Screening Tool    ASSESSMENT:   56 year old male with past history of non-insulin-dependent diabetes, hyperlipidemia, hypertension, lung CA who presented with ongoing dyspnea and chest pain.  Pt admitted with multi-vessel CAD undergoing CABG work-up.   Spoke with RN prior to visit, who reports that pt reported poor appetite PTA, however, is now eating everything provided to him. She suspects pt would benefit supplements to help curb his appetite.   Spoke with pt, who was able communicate in [place]. Attempted video interpreter x 2, however, was unable to establish connection. Pt shares that his appetite was poor prior to hospitalization. He states he consumed 3 meals per day, which consisted only of one cup of rice and steamed seafood or pork, due to early satiety. Per previous RD note, suspect pt was limiting portions to prevent hyperglycemia secondary to limited access to medications.   Pt reports that his UBW is 190#; he estimates he has lost 20#, however, unsure of time frame. However, per wt hx, wt has been stable over the past year.   Nutrition-Focused physical exam completed. Findings are no fat depletion, mild (lower extremities only) muscle depletion, and no edema. Pt reports decline in activity level, due to developing shortness of breath when participating in daily activities, such as walking.   Discussed importance of good meal completion to promote healing. Pt does not recall consuming Glucerna  supplements at [facility], stating "I only drank 7 Up".  Inpatient DM medications include: levemir 25 units BID and meal correction. Per DM coordinator note, pt was taking 1000 mg Metformin daily PTA.  Labs reviewed: CBGS: 239-390.   Diet Order:  Diet heart healthy/carb modified Room service appropriate? Yes; Fluid consistency: Thin  Skin:  Reviewed, no issues  Last BM:  03/18/17  Height:   Ht Readings from Last 1 Encounters:  03/17/17 5\' 3"  (1.6 m)    Weight:   Wt Readings from Last 1 Encounters:  03/17/17 164 lb 0.4 oz (74.4 kg)    Ideal Body Weight:  56.4 kg  BMI:  Body mass index is 29.06 kg/m.  Estimated Nutritional Needs:   Kcal:  1650-1850  Protein:  85-100 grams  Fluid:  1.6-1.8 L  EDUCATION NEEDS:   Education needs addressed  Mertha Clyatt A. Jimmye Norman, RD, LDN, CDE Pager: [phone] After hours Pager: [phone]

## 2017-03-19 NOTE — Progress Notes (Signed)
ANTICOAGULATION CONSULT NOTE - Follow Up Consult  Pharmacy Consult for Heparin  Indication: Multi-vessel CAD undergoing CABG work-up  No Known Allergies  Patient Measurements: Height: 5\' 3"  (160 cm) Weight: 164 lb 0.4 oz (74.4 kg) IBW/kg (Calculated) : 56.9  Vital Signs: Temp: 97.9 F (36.6 C) (07/23 0314) Temp Source: Oral (07/23 0314) BP: 124/80 (07/23 0314) Pulse Rate: 94 (07/23 0314)  Labs:  Recent Labs  03/16/17 0450 03/16/17 0453 03/16/17 0842  03/16/17 1426 03/16/17 2016  03/17/17 0530 03/17/17 1145 03/18/17 0428 03/19/17 0222  HGB 14.5  --   --   --   --   --   --  14.2  --  13.7 14.2  HCT 44.0  --   --   --   --   --   --  41.3  --  40.3 41.8  PLT 177  --   --   --   --   --   --  158  --  164 171  APTT  --  31  --   --   --   --   --   --   --  80*  --   LABPROT  --  13.0  --   --   --   --   --   --   --  12.8  --   INR  --  0.98  --   --   --   --   --   --   --  0.97  --   HEPARINUNFRC  --   --   --   < >  --   --   < >  --  0.63 0.30 0.24*  CREATININE 0.89  --   --   --   --   --   --  0.70  --  0.76  --   TROPONINI 0.47*  --  0.82*  --  1.64* 1.92*  --   --   --   --   --   < > = values in this interval not displayed.  Estimated Creatinine Clearance: 93.2 mL/min (by C-G formula based on SCr of 0.76 mg/dL).   Assessment: 56 y/o M with multi-vessel CAD undergoing CABG work-up. Heparin level is sub-therapeutic this AM. CBC stable.   Goal of Therapy:  Heparin level 0.3-0.7 units/ml Monitor platelets by anticoagulation protocol: Yes   Plan:  -Inc heparin to 1200 units/hr -1200 HL  Narda Bonds 03/19/2017,4:13 AM

## 2017-03-19 NOTE — Progress Notes (Addendum)
Inpatient Diabetes Program Recommendations  AACE/ADA: New Consensus Statement on Inpatient Glycemic Control (2015)  Target Ranges:  Prepandial:   less than 140 mg/dL      Peak postprandial:   less than 180 mg/dL (1-2 hours)      Critically ill patients:  140 - 180 mg/dL   Results for George Griffin, George Griffin (MRN [id number]) as of 03/19/2017 10:46  Ref. Range 03/18/2017 08:06 03/18/2017 11:36 03/18/2017 16:38 03/18/2017 21:04 03/19/2017 05:47 03/19/2017 07:24  Glucose-Capillary Latest Ref Range: 65 - 99 mg/dL 214 (H) 287 (H) 250 (H) 390 (H) 272 (H) 239 (H)   Review of Glycemic Control  Diabetes history: DM2 Outpatient Diabetes medications: Metformin 1066m qday, Janumet 50/1000mg tid, insulin noted 15 units tid (per chart review, pt did not know the name of the chart) Current orders for Inpatient glycemic control: Levemir 25 units BID, Novolog 0-9 units TID with meals  Inpatient Diabetes Program Recommendations: Insulin - Basal: Noted Levemir was increased from 40 units QHS to 25 units BID today. Patient received Levemir 40 units last night and has already received Levemir 25 units this morning. Correction (SSI): Please consider ordering Novolog 0-5 units QHS for bedtime correction scale. Insulin-Meal Coverage: If post prandial glucose continues to be consistently greater than 180 mg/dl despite increase in Levemir, please consider ordering Novolog 5 units TID with meals if patient is eating at least 50% of meals. Outpatient: Please note that patient has no insurance and no PCP. Patient will need assistance with getting medication and with establishing follow up care. Placed consult for CM to follow and assist.  NOTE: [facility] Patient was seen by Diabetes Coordinator on 03/16/17 and per note "Met with the patient and a [place]interpreter to discuss recent diabetes medications/care.  He tells me he was ordered 15 units of insulin tid but cannot tell me what  type it is.  He was not taking the insulin and it has been "a long time" since he saw a doctor.  He is taking Metformin 10038monce a day." Patient will need assistance with medications and with establishing follow up care. CM consult ordered.   Thanks, MaBarnie AldermanRN, MSN, CDE Diabetes Coordinator Inpatient Diabetes Program [phone] Pager from 8am to 5pm)

## 2017-03-19 NOTE — Progress Notes (Signed)
ANTICOAGULATION CONSULT NOTE   Pharmacy Consult for heparin drip Indication: chest pain/ACS  No Known Allergies  Patient Measurements: Height: 5\' 3"  (160 cm) Weight: 164 lb 0.4 oz (74.4 kg) IBW/kg (Calculated) : 56.9 Heparin Dosing Weight: 79.4 kg  Vital Signs: Temp: 99.3 F (37.4 C) (07/23 0725) Temp Source: Oral (07/23 0725) BP: 121/83 (07/23 0725) Pulse Rate: 95 (07/23 0725)  Labs:  Recent Labs  03/16/17 1426 03/16/17 2016  03/17/17 0530  03/18/17 0428 03/19/17 0222 03/19/17 1216  HGB  --   --   < > 14.2  --  13.7 14.2  --   HCT  --   --   --  41.3  --  40.3 41.8  --   PLT  --   --   --  158  --  164 171  --   APTT  --   --   --   --   --  80*  --   --   LABPROT  --   --   --   --   --  12.8  --   --   INR  --   --   --   --   --  0.97  --   --   HEPARINUNFRC  --   --   < >  --   < > 0.30 0.24* 0.10*  CREATININE  --   --   --  0.70  --  0.76  --   --   TROPONINI 1.64* 1.92*  --   --   --   --   --   --   < > = values in this interval not displayed.  Estimated Creatinine Clearance: 93.2 mL/min (by C-G formula based on SCr of 0.76 mg/dL).   Medical History: Past Medical History:  Diagnosis Date  . Acquired hypothyroidism 10/05/2015  . CAD (coronary artery disease), native coronary artery 03/17/2017  . Constipation   . Diabetes mellitus    metfomin  . ED (erectile dysfunction)   . Hepatic steatosis 07/26/11   severe ct chest  . Hyperlipidemia   . Hypertension   . Ischemic cardiomyopathy 03/17/2017  . Lung cancer ([place])    lung ca dx5/02/2011  . Neuropathy of finger    mild s/p chemotherapy  . Non-STEMI (non-ST elevated myocardial infarction) ([place]) 03/16/2017  . Pneumonia    hx    Medications:  Scheduled:  . aspirin EC  81 mg Oral Daily  . atorvastatin  80 mg Oral q1800  . carvedilol  9.375 mg Oral BID WC  . feeding supplement (GLUCERNA SHAKE)  237 mL Oral TID BM  . insulin aspart  0-15 Units Subcutaneous TID WC  . insulin aspart  0-5 Units  Subcutaneous QHS  . insulin aspart  5 Units Subcutaneous TID WC  . insulin detemir  25 Units Subcutaneous BID  . isosorbide mononitrate  30 mg Oral Daily  . mouth rinse  15 mL Mouth Rinse BID    Assessment: 56 yo male s/p cath at [facility] with 3V CAD.  Transferred to [facility] for surgical consult.  IV heparin restarted after cath, heparin level 0.1 despite heaprin drip rate increase this am to 1200 uts/hr.  No problem with IV site or line.  No overt bleeding or complications noted.  CBC stable.  Awaiting decision on CABG.  Goal of Therapy:  Heparin level 0.3-0.7 units/ml Monitor platelets by anticoagulation protocol: Yes   Plan:  Increase IV heparin drip rate 1400  uts/hr. CHeck 6hr HL  Daily heparin level and CBC. F/u plans for surgery.  Bonnita Nasuti Pharm.D. CPP, BCPS Clinical Pharmacist [phone] 03/19/2017 2:04 PM

## 2017-03-19 NOTE — Progress Notes (Signed)
Progress Note  Patient Name: George Griffin Date of Encounter: 03/19/2017  Primary Cardiologist: Paraschos at [facility]   Subjective   Has had 2 episodes of chest pain   Inpatient Medications    Scheduled Meds: . aspirin EC  81 mg Oral Daily  . atorvastatin  80 mg Oral q1800  . carvedilol  6.25 mg Oral BID WC  . insulin aspart  0-9 Units Subcutaneous TID WC  . insulin detemir  25 Units Subcutaneous BID  . mouth rinse  15 mL Mouth Rinse BID   Continuous Infusions: . heparin 1,200 Units/hr (03/19/17 0816)   PRN Meds: acetaminophen, nitroGLYCERIN, ondansetron (ZOFRAN) IV   Vital Signs    Vitals:   03/18/17 2315 03/19/17 0300 03/19/17 0314 03/19/17 0725  BP: 116/79  124/80 121/83  Pulse: 97 96 94 95  Resp: 19 19 19 17   Temp: 98.7 F (37.1 C)  97.9 F (36.6 C) 99.3 F (37.4 C)  TempSrc: Oral  Oral Oral  SpO2: 97% 95% 97% 98%  Weight:      Height:        Intake/Output Summary (Last 24 hours) at 03/19/17 1124 Last data filed at 03/19/17 0314  Gross per 24 hour  Intake          1095.57 ml  Output             1500 ml  Net          -404.43 ml   Filed Weights   03/17/17 1301  Weight: 164 lb 0.4 oz (74.4 kg)    Telemetry    SR - Personally Reviewed  ECG    No new - Personally Reviewed  Physical Exam   GEN: No acute distress.   Neck: No JVD Cardiac: RRR, no murmurs, rubs, or gallops.  Respiratory: Clear to auscultation bilaterally.with end exp wheeze in upper lobes GI: Soft, nontender, non-distended  MS: No edema; No deformity. Neuro:  Nonfocal  Psych: Normal affect   Labs    Chemistry Recent Labs Lab 03/16/17 0450 03/17/17 0530 03/18/17 0428  NA 137 138 137  K 4.3 4.0 4.1  CL 101 104 105  CO2 25 26 24   GLUCOSE 290* 159* 257*  BUN 22* 14 13  CREATININE 0.89 0.70 0.76  CALCIUM 9.0 8.3* 8.6*  PROT 7.3  --  6.5  ALBUMIN 4.1  --  3.2*  AST 32  --  21  ALT 29  --  20  ALKPHOS 39  --  37*  BILITOT 0.9  --  0.8  GFRNONAA >60 >60 >60    GFRAA >60 >60 >60  ANIONGAP 11 8 8      Hematology Recent Labs Lab 03/17/17 0530 03/18/17 0428 03/19/17 0222  WBC 7.5 6.8 8.6  RBC 4.76 4.71 4.91  HGB 14.2 13.7 14.2  HCT 41.3 40.3 41.8  MCV 86.9 85.6 85.1  MCH 29.9 29.1 28.9  MCHC 34.4 34.0 34.0  RDW 14.1 13.0 12.9  PLT 158 164 171    Cardiac Enzymes Recent Labs Lab 03/16/17 0450 03/16/17 0842 03/16/17 1426 03/16/17 2016  TROPONINI 0.47* 0.82* 1.64* 1.92*   No results for input(s): TROPIPOC in the last 168 hours.   BNP Recent Labs Lab 03/16/17 0450  BNP 240.0*     DDimer No results for input(s): DDIMER in the last 168 hours.   Radiology    Ct Angio Chest Pe W Or Wo Contrast  Result Date: 03/17/2017 CLINICAL DATA:  Dyspnea. History of lung cancer  post chemotherapy and radiation. EXAM: CT ANGIOGRAPHY CHEST WITH CONTRAST TECHNIQUE: Multidetector CT imaging of the chest was performed using the standard protocol during bolus administration of intravenous contrast. Multiplanar CT image reconstructions and MIPs were obtained to evaluate the vascular anatomy. CONTRAST:  100 cc Isovue 370 IV COMPARISON:  CXR 03/24/2017 and chest CT 04/17/2016 FINDINGS: Cardiovascular: Heart size is normal without pericardial effusion. There is coronary arteriosclerosis aortic atherosclerosis. No aneurysm. Satisfactory opacification of the pulmonary arterial system to the subsegmental level. No pulmonary embolus. Mediastinum/Nodes: Right distal paraesophageal 10 mm short axis lymph node is slightly larger than 5 mm seen on the 2017 comparison. No mediastinal adenopathy. Lungs/Pleura: Paraseptal emphysema with chronic right perihilar airway thickening and parenchymal hilar opacities likely representing chronic post therapy change. As there does appear to be more soft tissue consolidation, a new superimposed pneumonia is not entirely excluded. Moderate bilateral pleural effusions are seen with adjacent compressive atelectasis. Ground-glass bilateral  perihilar opacities may reflect changes of pulmonary edema given the pleural effusions present. Upper Abdomen: No acute abnormality. Musculoskeletal: Thoracic spondylosis. No acute lytic or blastic disease. Review of the MIP images confirms the above findings. IMPRESSION: 1. New moderate bilateral pleural effusions with compressive atelectasis and ground-glass perihilar airspace opacities suspicious for stigmata of pulmonary edema. Superimposed atypical infection, alveolitis or pneumonitis is not entirely excluded. 2. Chronic post therapy change involving the hila with peribronchial thickening and consolidations noted. Slightly more confluent soft tissue consolidation about the right hilum since prior exam may represent a new superimposed pneumonia. 3. Slight increase in distal paraesophageal lymph node size not 10 mm short axis versus 5 mm previously. 4. No acute pulmonary embolus. 5. Coronary arteriosclerosis aortic atherosclerosis. Aortic Atherosclerosis (ICD10-I70.0) and Emphysema (ICD10-J43.9). Electronically Signed   By: Ashley Royalty M.D.   On: 03/17/2017 19:45    Cardiac Studies   Cardiac cath 03/16/17  Procedures   Left Heart Cath and Coronary Angiography  Conclusion     Ost Cx to Prox Cx lesion, 75 %stenosed.  Prox LAD to Mid LAD lesion, 95 %stenosed.  Dist LAD lesion, 60 %stenosed.  Ost 1st Diag to 1st Diag lesion, 90 %stenosed.  Mid RCA lesion, 80 %stenosed.  Dist RCA lesion, 95 %stenosed.  Ost RPDA to RPDA lesion, 75 %stenosed.  2nd RPLB lesion, 75 %stenosed.  RPDA lesion, 90 %stenosed.   1. Severe three-vessel coronary artery disease with current crit high-grade stenosis mid LAD, 80% stenosis mid RCA with diffuse distal involvement, irregular 75% stenosis proximal left circumflex 2. Severe dilated cardiomyopathy  Recommendations  1. CABG 2. Probable ICD    ECHO 03/16/17 Study Conclusions  - Left ventricle: The cavity size was moderately dilated. LV Systolic  Fxn - moderately to severely reduced. EF ~ 30-35%. Apical AK - Aortic valve: There was mild regurgitation. Valve area (Vmax):   2.99 cm^2. - Mitral valve: There was mild regurgitation.  Patient Profile     56 y.o. male with CAD, acute systolic CHF, hx of lung cancer, IDDM, HLD, HTN.  Assessment & Plan    Principal Problem:   Non-STEMI (non-ST elevated myocardial infarction) ([place]) Active Problems:   History of lung cancer   Coronary artery disease involving native coronary artery of native heart with angina pectoris (HCC)   Ischemic cardiomyopathy   Acute systolic congestive heart failure (HCC)   Hyperlipidemia with target low density lipoprotein (LDL) cholesterol less than 70 mg/dL   DM2 (diabetes mellitus, type 2) ([place])   NSTEMI with need for CABG - on BB  coreg 6.25 BID, asa 81 mg   Angina on IV heparin and increase BB and add imdur  2 episodes of angina  Acute respiratory failure on admit with bipap improved   CAD multivessel plan for CABG pre CABG dopplers pending and PFTs  ICM with EF 30-35% for CABG on BB  IDDM with elevated glucose on SSI mild and Levemir increase SSI to mod.  And get diabetes coordinator to assist.  HLD on statin lipitor 80  Hx lung cancer, CT back PFTs pending    Signed, Cecilie Kicks, NP  03/19/2017, 11:24 AM    I have seen, examined and evaluated the patient this AM along with Cecilie Kicks, NP-C.  After reviewing all the available data and chart, we discussed the patients laboratory, study & physical findings as well as symptoms in detail. I agree with her findings, examination as well as impression recommendations as per our discussion.  Limited conversation with limited English.  Severe MV CAD with ICM --> hopeful for CABG pending PFT results.   Will need medication optimization post-op --> will increase BB dose gradually & add Imdur. --> would plan for continued optimization post revascularization prior to reassessing EF with ICM prior to  referral for ICD.  ASA/Statin DM-2 on Insulin - coronary Dz c/w Diabetic disease - small diffusely diseased vessels (not the best CABG targets, but truthfully his best option).  Levimer increased & Changing from Mild to Moderate Insulin Resistance on SSI - DM Consult team recommends QHS Coverage & CM consult for establishing appropriate OP PCP care.  Appears euvolemic - but will need close evaluation post-op for volume management - no longer on Lasix (monitor I/Os).  Will also have weaning difficulties given underlying lung disease.    Glenetta Hew, M.D., M.S. Interventional Cardiologist   Pager # [phone] Phone # [phone] [address] [place], [place] [address]

## 2017-03-20 ENCOUNTER — Inpatient Hospital Stay (HOSPITAL_COMMUNITY): Payer: Medicare Other

## 2017-03-20 ENCOUNTER — Ambulatory Visit (HOSPITAL_COMMUNITY): Payer: Medicare Other

## 2017-03-20 ENCOUNTER — Encounter (HOSPITAL_COMMUNITY): Payer: Self-pay

## 2017-03-20 DIAGNOSIS — I255 Ischemic cardiomyopathy: Secondary | ICD-10-CM

## 2017-03-20 DIAGNOSIS — E785 Hyperlipidemia, unspecified: Secondary | ICD-10-CM

## 2017-03-20 DIAGNOSIS — Z01810 Encounter for preprocedural cardiovascular examination: Secondary | ICD-10-CM

## 2017-03-20 DIAGNOSIS — E1100 Type 2 diabetes mellitus with hyperosmolarity without nonketotic hyperglycemic-hyperosmolar coma (NKHHC): Secondary | ICD-10-CM

## 2017-03-20 LAB — CBC
HCT: 41.2 % (ref 39.0–52.0)
Hemoglobin: 13.8 g/dL (ref 13.0–17.0)
MCH: 28.3 pg (ref 26.0–34.0)
MCHC: 33.5 g/dL (ref 30.0–36.0)
MCV: 84.4 fL (ref 78.0–100.0)
Platelets: 182 10*3/uL (ref 150–400)
RBC: 4.88 MIL/uL (ref 4.22–5.81)
RDW: 13.3 % (ref 11.5–15.5)
WBC: 7.6 10*3/uL (ref 4.0–10.5)

## 2017-03-20 LAB — URINALYSIS, ROUTINE W REFLEX MICROSCOPIC
Bacteria, UA: NONE SEEN
Bilirubin Urine: NEGATIVE
HGB URINE DIPSTICK: NEGATIVE
KETONES UR: NEGATIVE mg/dL
Leukocytes, UA: NEGATIVE
NITRITE: NEGATIVE
PH: 6 (ref 5.0–8.0)
Protein, ur: NEGATIVE mg/dL
Specific Gravity, Urine: 1.018 (ref 1.005–1.030)
Squamous Epithelial / LPF: NONE SEEN

## 2017-03-20 LAB — VAS US DOPPLER PRE CABG
LCCADDIAS: -29 cm/s
LEFT ECA DIAS: -19 cm/s
LEFT VERTEBRAL DIAS: 9 cm/s
LICADSYS: -52 cm/s
LICAPSYS: -69 cm/s
Left CCA dist sys: -79 cm/s
Left CCA prox dias: 19 cm/s
Left CCA prox sys: 66 cm/s
Left ICA dist dias: -24 cm/s
Left ICA prox dias: -27 cm/s
RCCADSYS: -55 cm/s
RCCAPSYS: 65 cm/s
RIGHT VERTEBRAL DIAS: -19 cm/s
Right CCA prox dias: 22 cm/s

## 2017-03-20 LAB — PULMONARY FUNCTION TEST
DL/VA % PRED: 90 %
DL/VA: 4.08 ml/min/mmHg/L
DLCO COR % PRED: 56 %
DLCO COR: 16.67 ml/min/mmHg
DLCO unc % pred: 54 %
DLCO unc: 16.28 ml/min/mmHg
FEF 25-75 POST: 2.47 L/s
FEF 25-75 Pre: 1.67 L/sec
FEF2575-%CHANGE-POST: 47 %
FEF2575-%PRED-POST: 82 %
FEF2575-%PRED-PRE: 56 %
FEV1-%CHANGE-POST: 6 %
FEV1-%Pred-Post: 65 %
FEV1-%Pred-Pre: 61 %
FEV1-POST: 2.28 L
FEV1-Pre: 2.14 L
FEV1FVC-%CHANGE-POST: -2 %
FEV1FVC-%PRED-PRE: 103 %
FEV6-%Change-Post: 7 %
FEV6-%Pred-Post: 67 %
FEV6-%Pred-Pre: 62 %
FEV6-POST: 2.92 L
FEV6-Pre: 2.72 L
FEV6FVC-%Change-Post: -1 %
FEV6FVC-%PRED-POST: 102 %
FEV6FVC-%Pred-Pre: 104 %
FVC-%CHANGE-POST: 8 %
FVC-%PRED-POST: 65 %
FVC-%PRED-PRE: 59 %
FVC-POST: 2.97 L
FVC-PRE: 2.73 L
PRE FEV1/FVC RATIO: 79 %
PRE FEV6/FVC RATIO: 100 %
Post FEV1/FVC ratio: 77 %
Post FEV6/FVC ratio: 99 %
RV % pred: 124 %
RV: 2.54 L
TLC % pred: 83 %
TLC: 5.47 L

## 2017-03-20 LAB — GLUCOSE, CAPILLARY
GLUCOSE-CAPILLARY: 184 mg/dL — AB (ref 65–99)
GLUCOSE-CAPILLARY: 235 mg/dL — AB (ref 65–99)
Glucose-Capillary: 229 mg/dL — ABNORMAL HIGH (ref 65–99)
Glucose-Capillary: 260 mg/dL — ABNORMAL HIGH (ref 65–99)
Glucose-Capillary: 237 mg/dL — ABNORMAL HIGH (ref 65–99)

## 2017-03-20 LAB — COMPREHENSIVE METABOLIC PANEL
ALT: 76 U/L — ABNORMAL HIGH (ref 17–63)
AST: 71 U/L — ABNORMAL HIGH (ref 15–41)
Albumin: 3.4 g/dL — ABNORMAL LOW (ref 3.5–5.0)
Alkaline Phosphatase: 51 U/L (ref 38–126)
Anion gap: 8 (ref 5–15)
BUN: 13 mg/dL (ref 6–20)
CO2: 25 mmol/L (ref 22–32)
Calcium: 9.5 mg/dL (ref 8.9–10.3)
Chloride: 103 mmol/L (ref 101–111)
Creatinine, Ser: 0.74 mg/dL (ref 0.61–1.24)
GFR calc Af Amer: >60 mL/min (ref >60)
GFR calc non Af Amer: >60 mL/min (ref >60)
Glucose, Bld: 200 mg/dL — ABNORMAL HIGH (ref 65–99)
Potassium: 4.1 mmol/L (ref 3.5–5.1)
Sodium: 136 mmol/L (ref 135–145)
Total Bilirubin: 0.8 mg/dL (ref 0.3–1.2)
Total Protein: 6.5 g/dL (ref 6.5–8.1)

## 2017-03-20 LAB — TSH: TSH: 3.154 u[IU]/mL (ref 0.350–4.500)

## 2017-03-20 LAB — HEPARIN LEVEL (UNFRACTIONATED): HEPARIN UNFRACTIONATED: 0.52 [IU]/mL (ref 0.30–0.70)

## 2017-03-20 MED ORDER — SODIUM CHLORIDE 0.9% FLUSH: 3.0000 mL | INTRAVENOUS | Status: DC | PRN

## 2017-03-20 MED ORDER — ASPIRIN 81 MG PO CHEW
81.0000 mg | CHEWABLE_TABLET | ORAL | Status: AC
  Administered 2017-03-21: 81 mg via ORAL
  Filled 2017-03-20: qty 1

## 2017-03-20 MED ORDER — SODIUM CHLORIDE 0.9 % IV SOLN: 250.0000 mL | INTRAVENOUS | Status: DC | PRN

## 2017-03-20 MED ORDER — CHLORHEXIDINE GLUCONATE CLOTH 2 % EX PADS
6.0000 | MEDICATED_PAD | Freq: Every day | CUTANEOUS | Status: DC
  Administered 2017-03-20 – 2017-03-23 (×4): 6 via TOPICAL
  Filled 2017-03-20: qty 6

## 2017-03-20 MED ORDER — ALBUTEROL SULFATE (2.5 MG/3ML) 0.083% IN NEBU
2.5000 mg | INHALATION_SOLUTION | Freq: Once | RESPIRATORY_TRACT | Status: AC
  Administered 2017-03-20: 2.5 mg via RESPIRATORY_TRACT

## 2017-03-20 MED ORDER — MUPIROCIN 2 % EX OINT
1.0000 "application " | TOPICAL_OINTMENT | Freq: Two times a day (BID) | CUTANEOUS | Status: DC
  Administered 2017-03-20 – 2017-03-23 (×7): 1 via NASAL
  Filled 2017-03-20 (×2): qty 22

## 2017-03-20 MED ORDER — SODIUM CHLORIDE 0.9% FLUSH
3.0000 mL | Freq: Two times a day (BID) | INTRAVENOUS | Status: DC
  Administered 2017-03-21: 3 mL via INTRAVENOUS

## 2017-03-20 MED ORDER — INSULIN DETEMIR 100 UNIT/ML ~~LOC~~ SOLN
28.0000 [IU] | Freq: Two times a day (BID) | SUBCUTANEOUS | Status: DC
  Administered 2017-03-20: 28 [IU] via SUBCUTANEOUS
  Filled 2017-03-20 (×2): qty 0.28

## 2017-03-20 MED ORDER — SODIUM CHLORIDE 0.9 % IV SOLN: INTRAVENOUS | Status: DC

## 2017-03-20 NOTE — Progress Notes (Signed)
ANTICOAGULATION CONSULT NOTE - Follow Up Consult  Pharmacy Consult for Heparin  Indication: Multi-vessel CAD undergoing CABG work-up  No Known Allergies  Patient Measurements: Height: 5\' 3"  (160 cm) Weight: 164 lb 0.4 oz (74.4 kg) IBW/kg (Calculated) : 56.9  Vital Signs: Temp: 98.3 F (36.8 C) (07/24 0345) Temp Source: Oral (07/24 0345) BP: 115/72 (07/24 0345) Pulse Rate: 85 (07/24 0345)  Labs:  Recent Labs  03/17/17 0530  03/18/17 0428 03/19/17 0222 03/19/17 1216 03/19/17 2019 03/20/17 0327  HGB 14.2  --  13.7 14.2  --   --  13.8  HCT 41.3  --  40.3 41.8  --   --  41.2  PLT 158  --  164 171  --   --  182  APTT  --   --  80*  --   --   --   --   LABPROT  --   --  12.8  --   --   --   --   INR  --   --  0.97  --   --   --   --   HEPARINUNFRC  --   < > 0.30 0.24* 0.10* 0.38 0.52  CREATININE 0.70  --  0.76  --   --   --   --   < > = values in this interval not displayed.  Estimated Creatinine Clearance: 93.2 mL/min (by C-G formula based on SCr of 0.76 mg/dL).   Assessment: 56 y/o M with multi-vessel CAD undergoing CABG work-up. Heparin level is therapeutic x 2. CBC stable.   Goal of Therapy:  Heparin level 0.3-0.7 units/ml Monitor platelets by anticoagulation protocol: Yes   Plan:  -Cont heparin 1400 units/hr -Daily CBC/HL -F/U CABG work-up  Narda Bonds 03/20/2017,4:26 AM

## 2017-03-20 NOTE — Progress Notes (Signed)
Progress Note  Patient Name: George Griffin Date of Encounter: 03/20/2017  Primary Cardiologist: Paraschos   Subjective   No chest pain but some SOB,HR up with much movement and resp are elevated.   Inpatient Medications    Scheduled Meds: . aspirin EC  81 mg Oral Daily  . atorvastatin  80 mg Oral q1800  . carvedilol  9.375 mg Oral BID WC  . Chlorhexidine Gluconate Cloth  6 each Topical Daily  . feeding supplement (GLUCERNA SHAKE)  237 mL Oral TID BM  . insulin aspart  0-15 Units Subcutaneous TID WC  . insulin aspart  0-5 Units Subcutaneous QHS  . insulin aspart  5 Units Subcutaneous TID WC  . insulin detemir  25 Units Subcutaneous BID  . isosorbide mononitrate  30 mg Oral Daily  . mouth rinse  15 mL Mouth Rinse BID  . mupirocin ointment  1 application Nasal BID   Continuous Infusions: . heparin 1,400 Units/hr (03/20/17 0300)   PRN Meds: acetaminophen, nitroGLYCERIN, ondansetron (ZOFRAN) IV   Vital Signs    Vitals:   03/20/17 0300 03/20/17 0345 03/20/17 0714 03/20/17 1000  BP:  115/72 110/71   Pulse: 87 85 88 90  Resp: 18 19 16    Temp:  98.3 F (36.8 C) 97.7 F (36.5 C)   TempSrc:  Oral Oral   SpO2: 95% 98% 95% 96%  Weight:      Height:        Intake/Output Summary (Last 24 hours) at 03/20/17 1543 Last data filed at 03/20/17 1300  Gross per 24 hour  Intake          1262.87 ml  Output             2100 ml  Net          -837.13 ml   Filed Weights   03/17/17 1301  Weight: 164 lb 0.4 oz (74.4 kg)    Telemetry    SR to ST - Personally Reviewed  ECG    No new  - Personally Reviewed  Physical Exam   GEN: No acute distress but easily SOB.   Neck: No JVD Cardiac: RRR, no murmurs, rubs, or gallops.  Respiratory: Clear to auscultation bilaterally. GI: Soft, nontender, non-distended  MS: No edema; No deformity. Neuro:  Nonfocal  Psych: Normal affect   Labs    Chemistry Recent Labs Lab 03/16/17 0450 03/17/17 0530 03/18/17 0428 03/20/17 0327    NA 137 138 137 136  K 4.3 4.0 4.1 4.1  CL 101 104 105 103  CO2 25 26 24 25   GLUCOSE 290* 159* 257* 200*  BUN 22* 14 13 13   CREATININE 0.89 0.70 0.76 0.74  CALCIUM 9.0 8.3* 8.6* 9.5  PROT 7.3  --  6.5 6.5  ALBUMIN 4.1  --  3.2* 3.4*  AST 32  --  21 71*  ALT 29  --  20 76*  ALKPHOS 39  --  37* 51  BILITOT 0.9  --  0.8 0.8  GFRNONAA >60 >60 >60 >60  GFRAA >60 >60 >60 >60  ANIONGAP 11 8 8 8      Hematology Recent Labs Lab 03/18/17 0428 03/19/17 0222 03/20/17 0327  WBC 6.8 8.6 7.6  RBC 4.71 4.91 4.88  HGB 13.7 14.2 13.8  HCT 40.3 41.8 41.2  MCV 85.6 85.1 84.4  MCH 29.1 28.9 28.3  MCHC 34.0 34.0 33.5  RDW 13.0 12.9 13.3  PLT 164 171 182    Cardiac Enzymes Recent Labs  Lab 03/16/17 0450 03/16/17 0842 03/16/17 1426 03/16/17 2016  TROPONINI 0.47* 0.82* 1.64* 1.92*   No results for input(s): TROPIPOC in the last 168 hours.   BNP Recent Labs Lab 03/16/17 0450  BNP 240.0*     DDimer No results for input(s): DDIMER in the last 168 hours.   Radiology    Ct Head W & Wo Contrast  Result Date: 03/19/2017 CLINICAL DATA:  F/U on lung cancer( for abd/pelvis too ). EXAM: CT HEAD WITHOUT AND WITH CONTRAST TECHNIQUE: Contiguous axial images were obtained from the base of the skull through the vertex without and with intravenous contrast CONTRAST:  100 ml isovue 300 iv COMPARISON:  11/01/2010 FINDINGS: Brain: Mild atrophy. No evidence of acute infarction, hemorrhage, hydrocephalus, extra-axial collection or mass lesion/mass effect. No areas of unexpected enhancement after IV contrast administration. Vascular: No hyperdense vessel or unexpected calcification. Visible vessels are patent.Calcified plaque in bilateral ICAs. Skull: Normal. Negative for fracture or focal lesion. Sinuses/Orbits: Mucoperiosteal thickening in bilateral maxillary sinuses. Retention cyst or polyp in the right maxillary sinus. Remainder visualized paranasal sinuses and mastoid air cells appear normally  developed and well aerated. Other: None IMPRESSION: 1. Negative for bleed, acute intracranial process, or metastatic disease. Electronically Signed   By: Lucrezia Europe M.D.   On: 03/19/2017 19:55   Ct Abdomen Pelvis W Contrast  Result Date: 03/19/2017 CLINICAL DATA:  Coronary artery disease. F/U on lung cancer, hilus EXAM: CT ABDOMEN AND PELVIS WITH CONTRAST TECHNIQUE: Multidetector CT imaging of the abdomen and pelvis was performed using the standard protocol following bolus administration of intravenous contrast. CONTRAST:  100 ml isovue 300 iv^<See Chart> ISOVUE-300 IOPAMIDOL (ISOVUE-300) INJECTION 61% COMPARISON:  CT chest 03/17/17; CT abdomen 03/25/2012 and previous FINDINGS: Lower chest: Small bilateral pleural effusions slightly improved. Improved aeration posteriorly in both lower lobes. Right perihilar soft tissue, incompletely visualized. Hepatobiliary: No focal liver abnormality is seen. No gallstones, gallbladder wall thickening, or biliary dilatation. Pancreas: Unremarkable. No pancreatic ductal dilatation or surrounding inflammatory changes. Spleen: Normal in size without focal abnormality. Adrenals/Urinary Tract: 13 mm soft tissue attenuation left adrenal nodule stable since 11/03/2010. Negative kidneys. No hydronephrosis or ureterectasis. Urinary bladder physiologically distended. Stomach/Bowel: Stomach, small bowel, colon nondilated. Appendix not discretely identified. No wall thickening or adjacent inflammatory/ edematous change identified. Vascular/Lymphatic: Moderate plaque in the infrarenal abdominal aorta and bilateral common iliac arteries. No aneurysm or high-grade stenosis. No abdominal or pelvic adenopathy localized. Early contrast opacification in the right common femoral and external iliac veins. Reproductive: Moderate prostatic enlargement with central coarse calcifications. Other: No ascites.  No free air. Musculoskeletal: Spurring in the visualized lower thoracic and lumbar spine.  Facet DJD L5-S1. No fracture or worrisome bone lesion. IMPRESSION: 1. Right perihilar disease, incompletely visualized. 2. Some interval decrease in bilateral pleural effusions. 3. No evidence of metastatic disease in the abdomen or pelvis. 4. Moderate infrarenal aortic and iliac arterial plaque. 5. 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 Note that the patient did have right femoral arterial access 03/16/2017 for cardiac catheterization. Consider ultrasound for further evaluation. Electronically Signed   By: Lucrezia Europe M.D.   On: 03/19/2017 19:52    Cardiac Studies   Carotid dopplers 03/20/17 - The vertebral arteries appear patent with antegrade flow. - Findings consistent with a 1- 34 percent stenosis involving the   right internal carotid artery and the left internal carotid   artery. Calcified plaque obscuring bifurcations bilaterally.  LE arterial dopplers Summary: No  obvious pseudoaneurysm or AVF noted on this exam  Procedures   Left Heart Cath and Coronary Angiography  Conclusion     Ost Cx to Prox Cx lesion, 75 %stenosed.  Prox LAD to Mid LAD lesion, 95 %stenosed.  Dist LAD lesion, 60 %stenosed.  Ost 1st Diag to 1st Diag lesion, 90 %stenosed.  Mid RCA lesion, 80 %stenosed.  Dist RCA lesion, 95 %stenosed.  Ost RPDA to RPDA lesion, 75 %stenosed.  2nd RPLB lesion, 75 %stenosed.  RPDA lesion, 90 %stenosed.   1. Severe three-vessel coronary artery disease with current crit high-grade stenosis mid LAD, 80% stenosis mid RCA with diffuse distal involvement, irregular 75% stenosis proximal left circumflex 2. Severe dilated cardiomyopathy  Recommendations  1. CABG 2. Probable ICD    Echo 03/16/17 Study Conclusions  - Left ventricle: The cavity size was moderately dilated. Systolic   function was moderately to severely reduced. The estimated   ejection fraction was in the range of 30% to 35%. Akinesis of the    apical myocardium. - Aortic valve: There was mild regurgitation. Valve area (Vmax):   2.99 cm^2. - Mitral valve: There was mild regurgitation.   Patient Profile     56 y.o. male 56 y.o. male with CAD, acute systolic CHF, hx of lung cancer, IDDM, HLD, HTN.and significant CAD with cath and NSTEMI, need for CABG.   Assessment & Plan    NSTEMI with need for CABG - on BB coreg 6.25 BID, imdur 30 - BP soft would bedifficult to increase BB asa 81 mg  Having work up for  CABG   Angina on IV heparin and increased BB and added imdur yesterday for 2 episodes of angina--no further angina  Acute respiratory failure on admit with bipap improved plan for Rt heart cath on Thursday with Dr. Aundra Dubin  Negative [id number] since admit   CAD multivessel plan for CABG pre CABG dopplers see above and PFTs in process   ICM with EF 30-35% for CABG on BB  IDDM with elevated glucose on SSI mild and Levemir increase SSI to mod.  will increase levemir to 28 units.per DM coordinator recommendations. Glucose [id number]  HLD on statin lipitor 80  LFTs increased mildly - recheck tomorrow  Hx lung cancer, CT back PFTs pending  Signed, Cecilie Kicks, NP  03/20/2017, 3:43 PM     I have seen, examined and evaluated the patient this PM along with Cecilie Kicks, NP.  After reviewing all the available data and chart, we discussed the patients laboratory, study & physical findings as well as symptoms in detail. I agree with her findings, examination as well as impression recommendations as per our discussion.    When I saw the patient this afternoon, he was resting in bed comfortably having just given himself a bath. Was not on oxygen, not complaining of dyspnea or any chest pain - since increasing beta blocker and Imdur addition.  He remains on IV heparin. Per Dr. Lucianne Lei Trigt's recommendations, he will undergo right heart catheterization tomorrow to evaluate LV filling pressures as well as pulmonary pressures given his  reduced EF.  We are adjusting glycemic control with diabetic consultation 213 systems.  My understanding is the patient is due to be scheduled for bypass surgery on Friday. We will continue IV heparin until then. Maintain a chest pain-free state.  Continue atorvastatin, carvedilol and Imdur.    Glenetta Hew, M.D., M.S. Interventional Cardiologist   Pager # [phone] Phone # [phone] [address]. [place], [place]  [address]   

## 2017-03-20 NOTE — Progress Notes (Signed)
Inpatient Diabetes Program Recommendations  AACE/ADA: New Consensus Statement on Inpatient Glycemic Control (2015)  Target Ranges:  Prepandial:   less than 140 mg/dL      Peak postprandial:   less than 180 mg/dL (1-2 hours)      Critically ill patients:  140 - 180 mg/dL   Results for KRISS, ISHLER (MRN [id number]) as of 03/20/2017 09:54  Ref. Range 03/19/2017 07:24 03/19/2017 11:41 03/19/2017 16:47 03/19/2017 21:17 03/20/2017 07:27 03/20/2017 08:17  Glucose-Capillary Latest Ref Range: 65 - 99 mg/dL 239 (H) 294 (H) 267 (H) 272 (H) 184 (H) 229 (H)   Review of Glycemic Control  Current orders for Inpatient glycemic control: Levemir 25 units BID, Novolog 0-15 units TID with meals, Novolog 0-5 units QHS, Novolog 5 units TID with meals for meal coverage  Inpatient Diabetes Program Recommendations: Insulin - Basal: Please consider increasing Levemir to 28 units BID.  Thanks, Barnie Alderman, RN, MSN, CDE Diabetes Coordinator Inpatient Diabetes Program [phone] (Team Pager from 8am to 5pm)

## 2017-03-20 NOTE — Progress Notes (Signed)
*  PRELIMINARY RESULTS* Vascular Ultrasound Lower Extremity Arterial Duplex has been completed.  Preliminary findings: No obvious signs of pseudoaneurysm or AVF on this exam.  Best images possible were obtained, limited due to cath bandage.  Everrett Coombe 03/20/2017, 12:17 PM

## 2017-03-20 NOTE — Progress Notes (Signed)
Pre-op Cardiac Surgery  Carotid Findings:  Bilateral 1-39% ICA stenosis, antegrade vertebral flow. Calcified plaque obscuring bilateral carotid bifurcations.   Upper Extremity Right Left  Brachial Pressures 102, Tri 101, Tri  Radial Waveforms Tri Tri  Ulnar Waveforms Tri Tri  Palmar Arch (Allen's Test) waveform increases greater than 50% with radial compression and nearly obliterates with ulnar compression. waveform increases greater than 50% with radial compression and diminished greater than 50% with ulnar compression.   *Normal ABI in April 2018  [place], RVT 12:29 PM  03/20/2017

## 2017-03-21 ENCOUNTER — Encounter (HOSPITAL_COMMUNITY)
Admission: AD | Disposition: A | Discharge status: Home / Self Care | Payer: Self-pay | Source: Other Acute Inpatient Hospital | Attending: Cardiothoracic Surgery

## 2017-03-21 DIAGNOSIS — Z85118 Personal history of other malignant neoplasm of bronchus and lung: Secondary | ICD-10-CM

## 2017-03-21 DIAGNOSIS — I429 Cardiomyopathy, unspecified: Secondary | ICD-10-CM

## 2017-03-21 HISTORY — PX: RIGHT HEART CATH: CATH118263

## 2017-03-21 LAB — POCT I-STAT 3, VENOUS BLOOD GAS (G3P V)
ACID-BASE EXCESS: 1 mmol/L (ref 0.0–2.0)
Acid-Base Excess: 3 mmol/L — ABNORMAL HIGH (ref 0.0–2.0)
Bicarbonate: 26.1 mmol/L (ref 20.0–28.0)
Bicarbonate: 27.5 mmol/L (ref 20.0–28.0)
O2 SAT: 59 %
O2 SAT: 60 %
PH VEN: 7.404 (ref 7.250–7.430)
TCO2: 29 mmol/L (ref 0–100)
TCO2: 27 mmol/L (ref 0–100)
pCO2, Ven: 41.8 mmHg — ABNORMAL LOW (ref 44.0–60.0)
pCO2, Ven: 42.2 mmHg — ABNORMAL LOW (ref 44.0–60.0)
pH, Ven: 7.422 (ref 7.250–7.430)
pO2, Ven: 30 mmHg — CL (ref 32.0–45.0)
pO2, Ven: 31 mmHg — CL (ref 32.0–45.0)

## 2017-03-21 LAB — GLUCOSE, CAPILLARY
GLUCOSE-CAPILLARY: 246 mg/dL — AB (ref 65–99)
GLUCOSE-CAPILLARY: 159 mg/dL — AB (ref 65–99)
Glucose-Capillary: 223 mg/dL — ABNORMAL HIGH (ref 65–99)
Glucose-Capillary: 297 mg/dL — ABNORMAL HIGH (ref 65–99)

## 2017-03-21 LAB — COMPREHENSIVE METABOLIC PANEL
ALBUMIN: 3.4 g/dL — AB (ref 3.5–5.0)
ALK PHOS: 53 U/L (ref 38–126)
ALT: 90 U/L — ABNORMAL HIGH (ref 17–63)
ANION GAP: 10 (ref 5–15)
AST: 62 U/L — ABNORMAL HIGH (ref 15–41)
BUN: 16 mg/dL (ref 6–20)
CALCIUM: 9.5 mg/dL (ref 8.9–10.3)
CHLORIDE: 102 mmol/L (ref 101–111)
CO2: 24 mmol/L (ref 22–32)
Creatinine, Ser: 0.84 mg/dL (ref 0.61–1.24)
GFR calc non Af Amer: >60 mL/min (ref >60)
GLUCOSE: 202 mg/dL — AB (ref 65–99)
Potassium: 4 mmol/L (ref 3.5–5.1)
SODIUM: 136 mmol/L (ref 135–145)
Total Bilirubin: 0.5 mg/dL (ref 0.3–1.2)
Total Protein: 7.1 g/dL (ref 6.5–8.1)

## 2017-03-21 LAB — HEMOGLOBIN A1C
HEMOGLOBIN A1C: 11.9 % — AB (ref 4.8–5.6)
Hgb A1c MFr Bld: 11.7 % — ABNORMAL HIGH (ref 4.8–5.6)
MEAN PLASMA GLUCOSE: 295 mg/dL
Mean Plasma Glucose: 289 mg/dL

## 2017-03-21 LAB — HEPARIN LEVEL (UNFRACTIONATED): HEPARIN UNFRACTIONATED: 0.62 [IU]/mL (ref 0.30–0.70)

## 2017-03-21 LAB — CBC
HCT: 41 % (ref 39.0–52.0)
HEMOGLOBIN: 14.1 g/dL (ref 13.0–17.0)
MCH: 29 pg (ref 26.0–34.0)
MCHC: 34.4 g/dL (ref 30.0–36.0)
MCV: 84.4 fL (ref 78.0–100.0)
PLATELETS: 190 10*3/uL (ref 150–400)
RBC: 4.86 MIL/uL (ref 4.22–5.81)
RDW: 12.9 % (ref 11.5–15.5)
WBC: 9.2 10*3/uL (ref 4.0–10.5)

## 2017-03-21 SURGERY — RIGHT HEART CATH
Anesthesia: LOCAL

## 2017-03-21 MED ORDER — LIDOCAINE HCL (PF) 1 % IJ SOLN
INTRAMUSCULAR | Status: DC | PRN
  Administered 2017-03-21: 2 mL

## 2017-03-21 MED ORDER — SODIUM CHLORIDE 0.9 % IV SOLN: 250.0000 mL | INTRAVENOUS | Status: DC | PRN

## 2017-03-21 MED ORDER — ONDANSETRON HCL 4 MG/2ML IJ SOLN: 4.0000 mg | Freq: Four times a day (QID) | INTRAMUSCULAR | Status: DC | PRN

## 2017-03-21 MED ORDER — SODIUM CHLORIDE 0.9 % IV SOLN
INTRAVENOUS | Status: DC | PRN
  Administered 2017-03-21: 10 mL/h via INTRAVENOUS

## 2017-03-21 MED ORDER — INSULIN ASPART 100 UNIT/ML ~~LOC~~ SOLN
8.0000 [IU] | Freq: Three times a day (TID) | SUBCUTANEOUS | Status: DC
  Administered 2017-03-21 – 2017-03-22 (×4): 8 [IU] via SUBCUTANEOUS

## 2017-03-21 MED ORDER — SODIUM CHLORIDE 0.9% FLUSH: 3.0000 mL | INTRAVENOUS | Status: DC | PRN

## 2017-03-21 MED ORDER — HEPARIN (PORCINE) IN NACL 2-0.9 UNIT/ML-% IJ SOLN
INTRAMUSCULAR | Status: AC
  Filled 2017-03-21: qty 500

## 2017-03-21 MED ORDER — HEPARIN (PORCINE) IN NACL 100-0.45 UNIT/ML-% IJ SOLN
1300.0000 [IU]/h | INTRAMUSCULAR | Status: DC
  Administered 2017-03-22 (×2): 1400 [IU]/h via INTRAVENOUS
  Filled 2017-03-21 (×2): qty 250

## 2017-03-21 MED ORDER — LIDOCAINE HCL (PF) 1 % IJ SOLN
INTRAMUSCULAR | Status: AC
Start: 2017-03-21 — End: ?
  Filled 2017-03-21: qty 30

## 2017-03-21 MED ORDER — INSULIN DETEMIR 100 UNIT/ML ~~LOC~~ SOLN
30.0000 [IU] | Freq: Two times a day (BID) | SUBCUTANEOUS | Status: DC
  Filled 2017-03-21 (×2): qty 0.3

## 2017-03-21 MED ORDER — SODIUM CHLORIDE 0.9% FLUSH
3.0000 mL | Freq: Two times a day (BID) | INTRAVENOUS | Status: DC
  Administered 2017-03-21 – 2017-03-23 (×3): 3 mL via INTRAVENOUS

## 2017-03-21 MED ORDER — HEPARIN (PORCINE) IN NACL 2-0.9 UNIT/ML-% IJ SOLN
INTRAMUSCULAR | Status: AC | PRN
  Administered 2017-03-21: 500 mL

## 2017-03-21 MED ORDER — ACETAMINOPHEN 325 MG PO TABS
650.0000 mg | ORAL_TABLET | ORAL | Status: DC | PRN
Start: 2017-03-21 — End: 2017-03-23

## 2017-03-21 SURGICAL SUPPLY — 7 items
CATH BALLN WEDGE 5F 110CM (CATHETERS) ×2 IMPLANT
KIT HEART LEFT (KITS) ×2 IMPLANT
PACK CARDIAC CATHETERIZATION (CUSTOM PROCEDURE TRAY) ×2 IMPLANT
SHEATH GLIDE SLENDER 4/5FR (SHEATH) ×2 IMPLANT
TRANSDUCER W/STOPCOCK (MISCELLANEOUS) ×2 IMPLANT
TUBING ART PRESS 72  MALE/FEM (TUBING) ×1
TUBING ART PRESS 72 MALE/FEM (TUBING) ×1 IMPLANT

## 2017-03-21 NOTE — Progress Notes (Deleted)
Thanks  [facility]

## 2017-03-21 NOTE — Interval H&P Note (Signed)
History and Physical Interval Note:  03/21/2017 5:02 PM  George Griffin  has presented today for surgery, with the diagnosis of hf  The various methods of treatment have been discussed with the patient and family. After consideration of risks, benefits and other options for treatment, the patient has consented to  Procedure(s): Right Heart Cath (N/A) as a surgical intervention .  The patient's history has been reviewed, patient examined, no change in status, stable for surgery.  I have reviewed the patient's chart and labs.  Questions were answered to the patient's satisfaction.     [name] [employer]

## 2017-03-21 NOTE — Progress Notes (Signed)
ANTICOAGULATION CONSULT NOTE - Initial Consult  Pharmacy Consult for Heparin Indication: chest pain/ACS  No Known Allergies  Patient Measurements: Height: 5\' 3"  (160 cm) Weight: 164 lb 0.4 oz (74.4 kg) IBW/kg (Calculated) : 56.9 Heparin Dosing Weight: 72  Vital Signs: Temp: 98.4 F (36.9 C) (07/25 1519) Temp Source: Oral (07/25 1519) BP: 107/70 (07/25 1721) Pulse Rate: 93 (07/25 1721)  Labs:  Recent Labs  03/19/17 0222  03/19/17 2019 03/20/17 0327 03/21/17 0248  HGB 14.2  --   --  13.8 14.1  HCT 41.8  --   --  41.2 41.0  PLT 171  --   --  182 190  HEPARINUNFRC 0.24*  < > 0.38 0.52 0.62  CREATININE  --   --   --  0.74 0.84  < > = values in this interval not displayed.  Estimated Creatinine Clearance: 88.8 mL/min (by C-G formula based on SCr of 0.84 mg/dL).  Assessment: 30 yoM s/p RHC. Pharmacy consulted to restart heparin 8 hours post sheath removal. Sheath removed at 17:22 on 7/25. No issues with bleeding per nurse. Patient was previously therapeutic on 1400 units/hr, so will restart at this rate.   Goal of Therapy:  Heparin level 0.3-0.7 units/ml Monitor platelets by anticoagulation protocol: Yes   Plan:  Start heparin 1400 units/hr on 7/26 at 0130 Check 6 hour heparin level Monitor daily heparin level, CBC Monitor S/Sx of bleeding  Dierdre Harness, PharmD Clinical Pharmacist [phone] (Pager) 03/21/2017 6:51 PM

## 2017-03-21 NOTE — Progress Notes (Signed)
ANTICOAGULATION CONSULT NOTE - Follow Up Consult  Pharmacy Consult for Heparin  Indication: Multi-vessel CAD undergoing CABG work-up  No Known Allergies  Patient Measurements: Height: 5\' 3"  (160 cm) Weight: 164 lb 0.4 oz (74.4 kg) IBW/kg (Calculated) : 56.9  Vital Signs: Temp: 98.6 F (37 C) (07/25 1259) Temp Source: Oral (07/25 1259) BP: 104/68 (07/25 0704) Pulse Rate: 87 (07/25 0704)  Labs:  Recent Labs  03/19/17 0222  03/19/17 2019 03/20/17 0327 03/21/17 0248  HGB 14.2  --   --  13.8 14.1  HCT 41.8  --   --  41.2 41.0  PLT 171  --   --  182 190  HEPARINUNFRC 0.24*  < > 0.38 0.52 0.62  CREATININE  --   --   --  0.74 0.84  < > = values in this interval not displayed.  Estimated Creatinine Clearance: 88.8 mL/min (by C-G formula based on SCr of 0.84 mg/dL).   Assessment: 56 y/o M with multi-vessel CAD undergoing CABG work-up. Heparin drip 1400 uts/hr heparin level is therapeutic 0.6. CBC stable.   Goal of Therapy:  Heparin level 0.3-0.7 units/ml Monitor platelets by anticoagulation protocol: Yes   Plan:  -Cont heparin 1400 units/hr -Daily CBC/HL -F/U CABG work-up  Bonnita Nasuti Pharm.D. CPP, BCPS Clinical Pharmacist [phone] 03/21/2017 2:54 PM

## 2017-03-21 NOTE — Progress Notes (Signed)
Progress Note  Patient Name: George Griffin Date of Encounter: 03/21/2017  Primary Cardiologist: Paraschos   Subjective   Doing well today.  Sitting up in bed. NO CP or dyspnea.  No palpitations. Hungry.  Inpatient Medications    Scheduled Meds: . aspirin EC  81 mg Oral Daily  . atorvastatin  80 mg Oral q1800  . carvedilol  9.375 mg Oral BID WC  . Chlorhexidine Gluconate Cloth  6 each Topical Daily  . feeding supplement (GLUCERNA SHAKE)  237 mL Oral TID BM  . insulin aspart  0-15 Units Subcutaneous TID WC  . insulin aspart  0-5 Units Subcutaneous QHS  . insulin aspart  8 Units Subcutaneous TID WC  . insulin detemir  30 Units Subcutaneous BID  . isosorbide mononitrate  30 mg Oral Daily  . mouth rinse  15 mL Mouth Rinse BID  . mupirocin ointment  1 application Nasal BID  . sodium chloride flush  3 mL Intravenous Q12H   Continuous Infusions: . sodium chloride    . sodium chloride    . heparin 1,400 Units/hr (03/21/17 0555)   PRN Meds: sodium chloride, acetaminophen, nitroGLYCERIN, ondansetron (ZOFRAN) IV, sodium chloride flush   Vital Signs    Vitals:   03/20/17 2333 03/21/17 0300 03/21/17 0438 03/21/17 0704  BP: 108/72  104/75 104/68  Pulse: 91 88 (!) 101 87  Resp: 13 19 (!) 28 13  Temp: 98.8 F (37.1 C)  98.6 F (37 C) 97.8 F (36.6 C)  TempSrc: Oral  Oral Oral  SpO2: 93% 94% 97% 98%  Weight:      Height:        Intake/Output Summary (Last 24 hours) at 03/21/17 0839 Last data filed at 03/21/17 0439  Gross per 24 hour  Intake             1143 ml  Output             1950 ml  Net             -807 ml   Filed Weights   03/17/17 1301  Weight: 164 lb 0.4 oz (74.4 kg)    Telemetry    SR to ST - Personally Reviewed  ECG    No new  - Personally Reviewed  Physical Exam    GEN: No acute distress - no increased WOB Neck: No JVD or bruit. Cardiac: RRR, Nl S1 & S2. No M/R/G. Laterally displaced PMI  Respiratory: CTAB, non-labored. No W/R/R GI: soft/  NT/ND/NABS MS: No C/C/E.  Neuro:  Grossly normal. Non-focal Psych: Pleasant mood & affect   Labs    Chemistry  Recent Labs Lab 03/18/17 0428 03/20/17 0327 03/21/17 0248  NA 137 136 136  K 4.1 4.1 4.0  CL 105 103 102  CO2 24 25 24   GLUCOSE 257* 200* 202*  BUN 13 13 16   CREATININE 0.76 0.74 0.84  CALCIUM 8.6* 9.5 9.5  PROT 6.5 6.5 7.1  ALBUMIN 3.2* 3.4* 3.4*  AST 21 71* 62*  ALT 20 76* 90*  ALKPHOS 37* 51 53  BILITOT 0.8 0.8 0.5  GFRNONAA >60 >60 >60  GFRAA >60 >60 >60  ANIONGAP 8 8 10      Hematology  Recent Labs Lab 03/19/17 0222 03/20/17 0327 03/21/17 0248  WBC 8.6 7.6 9.2  RBC 4.91 4.88 4.86  HGB 14.2 13.8 14.1  HCT 41.8 41.2 41.0  MCV 85.1 84.4 84.4  MCH 28.9 28.3 29.0  MCHC 34.0 33.5 34.4  RDW  12.9 13.3 12.9  PLT 171 182 190    Cardiac Enzymes  Recent Labs Lab 03/16/17 0450 03/16/17 0842 03/16/17 1426 03/16/17 2016  TROPONINI 0.47* 0.82* 1.64* 1.92*   No results for input(s): TROPIPOC in the last 168 hours.   BNP  Recent Labs Lab 03/16/17 0450  BNP 240.0*     DDimer No results for input(s): DDIMER in the last 168 hours.   Radiology    Ct Head W & Wo Contrast  Result Date: 03/19/2017 CLINICAL DATA:  F/U on lung cancer( for abd/pelvis too ). EXAM: CT HEAD WITHOUT AND WITH CONTRAST TECHNIQUE: Contiguous axial images were obtained from the base of the skull through the vertex without and with intravenous contrast CONTRAST:  100 ml isovue 300 iv COMPARISON:  11/01/2010 FINDINGS: Brain: Mild atrophy. No evidence of acute infarction, hemorrhage, hydrocephalus, extra-axial collection or mass lesion/mass effect. No areas of unexpected enhancement after IV contrast administration. Vascular: No hyperdense vessel or unexpected calcification. Visible vessels are patent.Calcified plaque in bilateral ICAs. Skull: Normal. Negative for fracture or focal lesion. Sinuses/Orbits: Mucoperiosteal thickening in bilateral maxillary sinuses. Retention cyst or  polyp in the right maxillary sinus. Remainder visualized paranasal sinuses and mastoid air cells appear normally developed and well aerated. Other: None IMPRESSION: 1. Negative for bleed, acute intracranial process, or metastatic disease. Electronically Signed   By: Lucrezia Europe M.D.   On: 03/19/2017 19:55   Ct Abdomen Pelvis W Contrast  Result Date: 03/19/2017 CLINICAL DATA:  Coronary artery disease. F/U on lung cancer, hilus EXAM: CT ABDOMEN AND PELVIS WITH CONTRAST TECHNIQUE: Multidetector CT imaging of the abdomen and pelvis was performed using the standard protocol following bolus administration of intravenous contrast. CONTRAST:  100 ml isovue 300 iv^<See Chart> ISOVUE-300 IOPAMIDOL (ISOVUE-300) INJECTION 61% COMPARISON:  CT chest 03/17/17; CT abdomen 03/25/2012 and previous FINDINGS: Lower chest: Small bilateral pleural effusions slightly improved. Improved aeration posteriorly in both lower lobes. Right perihilar soft tissue, incompletely visualized. Hepatobiliary: No focal liver abnormality is seen. No gallstones, gallbladder wall thickening, or biliary dilatation. Pancreas: Unremarkable. No pancreatic ductal dilatation or surrounding inflammatory changes. Spleen: Normal in size without focal abnormality. Adrenals/Urinary Tract: 13 mm soft tissue attenuation left adrenal nodule stable since 11/03/2010. Negative kidneys. No hydronephrosis or ureterectasis. Urinary bladder physiologically distended. Stomach/Bowel: Stomach, small bowel, colon nondilated. Appendix not discretely identified. No wall thickening or adjacent inflammatory/ edematous change identified. Vascular/Lymphatic: Moderate plaque in the infrarenal abdominal aorta and bilateral common iliac arteries. No aneurysm or high-grade stenosis. No abdominal or pelvic adenopathy localized. Early contrast opacification in the right common femoral and external iliac veins. Reproductive: Moderate prostatic enlargement with central coarse calcifications.  Other: No ascites.  No free air. Musculoskeletal: Spurring in the visualized lower thoracic and lumbar spine. Facet DJD L5-S1. No fracture or worrisome bone lesion. IMPRESSION: 1. Right perihilar disease, incompletely visualized. 2. Some interval decrease in bilateral pleural effusions. 3. No evidence of metastatic disease in the abdomen or pelvis. 4. Moderate infrarenal aortic and iliac arterial plaque. 5. 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 Note that the patient did have right femoral arterial access 03/16/2017 for cardiac catheterization. Consider ultrasound for further evaluation. Electronically Signed   By: Lucrezia Europe M.D.   On: 03/19/2017 19:52    Cardiac Studies   Carotid dopplers 03/20/17 - The vertebral arteries appear patent with antegrade flow. - Findings consistent with a 1- 39 percent stenosis involving the   right internal carotid artery and the  left internal carotid   artery. Calcified plaque obscuring bifurcations bilaterally.  LE arterial dopplers Summary: No obvious pseudoaneurysm or AVF noted on this exam  Procedures   Left Heart Cath and Coronary Angiography  Conclusion     Ost Cx to Prox Cx lesion, 75 %stenosed.  Prox LAD to Mid LAD lesion, 95 %stenosed.  Dist LAD lesion, 60 %stenosed.  Ost 1st Diag to 1st Diag lesion, 90 %stenosed.  Mid RCA lesion, 80 %stenosed.  Dist RCA lesion, 95 %stenosed.  Ost RPDA to RPDA lesion, 75 %stenosed.  2nd RPLB lesion, 75 %stenosed.  RPDA lesion, 90 %stenosed.   1. Severe three-vessel coronary artery disease with current crit high-grade stenosis mid LAD, 80% stenosis mid RCA with diffuse distal involvement, irregular 75% stenosis proximal left circumflex 2. Severe dilated cardiomyopathy  Recommendations  1. CABG 2. Probable ICD    Echo 03/16/17 Study Conclusions  - Left ventricle: The cavity size was moderately dilated. Systolic   function was  moderately to severely reduced. The estimated   ejection fraction was in the range of 30% to 35%. Akinesis of the   apical myocardium. - Aortic valve: There was mild regurgitation. Valve area (Vmax):   2.99 cm^2. - Mitral valve: There was mild regurgitation.   Patient Profile     56 y.o. male 56 y.o. male with CAD, acute systolic CHF, hx of lung cancer, IDDM, HLD, HTN.and significant CAD with cath and NSTEMI, need for CABG.   Assessment & Plan   Principal Problem:   Non-STEMI (non-ST elevated myocardial infarction) ([place]) Active Problems:   History of lung cancer   Coronary artery disease involving native coronary artery of native heart with angina pectoris (HCC)   Ischemic cardiomyopathy   Acute systolic congestive heart failure (HCC)   Hyperlipidemia with target low density lipoprotein (LDL) cholesterol less than 70 mg/dL   DM2 (diabetes mellitus, type 2) (HCC)    Principal Problem:   Non-STEMI (non-ST elevated myocardial infarction) ([place]) /Coronary artery disease involving native coronary artery of native heart with angina pectoris ([place]) - MV CAD on Cath - referred for CABG (planned for Friday)  Initially had peri-infarct Angina --> back on IV Heparin.  Now, no further Sx with increase BB & addition of Imdur   Active Problems:   History of lung cancer - per DR. Prescott Gum - would like RHC to measure PAP/RVP  Per CVTS - PFTs & CT chest ordered & reviewed - defer to their expertise.      Ischemic cardiomyopathy /   Acute systolic congestive heart failure (HCC)  Euvolemic on Exam; off of diuretic.  RHC today (Dr. Aundra Dubin) for PAP/PCWP & RVP as part of pre-op eval. --> if #s are relatively controlled, can transfer to [facility] today.  LFTs trending back down - if still high would hold statin.    Hyperlipidemia with target low density lipoprotein (LDL) cholesterol less than 70 mg/dL -- on high dose Statin   DM2 (diabetes mellitus, type 2) (HCC) - glycemic control not quite  adequate on BID Levimer / Meal Coverage & SSI -->   per DM C/s team, will increase Levimer to 30 Units BID & meal coverage insulin to 8 Units (holding today for RHC)  Signed, Glenetta Hew, MD  03/21/2017, 8:39 AM    Glenetta Hew, M.D., M.S. Interventional Cardiologist   Pager # [phone] Phone # [phone] [address] [place], [place] [address]

## 2017-03-21 NOTE — Progress Notes (Signed)
CARDIAC REHAB PHASE I   PRE:  Rate/Rhythm: 87 SR  BP:  Sitting: 89/55        SaO2: 96 RA  MODE:  Ambulation: 680 ft   POST:  Rate/Rhythm: 96 SR  BP:  Sitting: 104/60         SaO2: 94 RA  Pt ambulated 680 ft on RA, IV, assist x1, steady gait, tolerated well with no complaints other than mild hip pain. Cardiac surgery pre-op education completed with pt using video interpreter at bedside. Reviewed IS, sternal precautions, activity progression, cardiac surgery booklet and cardiac surgery guidelines. Pt verbalized understanding. Pt states he has a wife who is currently out of town, but mostly he lives alone, states he may have a friend that could stay with him after surgery. Will need follow up. Pt to bed per pt request after walk, call bell within reach. Will follow.   [id number] Lenna Sciara, RN, BSN 03/21/2017 11:07 AM

## 2017-03-21 NOTE — H&P (View-Only) (Signed)
Progress Note  Patient Name: George Griffin Date of Encounter: 03/21/2017  Primary Cardiologist: Paraschos   Subjective   Doing well today.  Sitting up in bed. NO CP or dyspnea.  No palpitations. Hungry.  Inpatient Medications    Scheduled Meds: . aspirin EC  81 mg Oral Daily  . atorvastatin  80 mg Oral q1800  . carvedilol  9.375 mg Oral BID WC  . Chlorhexidine Gluconate Cloth  6 each Topical Daily  . feeding supplement (GLUCERNA SHAKE)  237 mL Oral TID BM  . insulin aspart  0-15 Units Subcutaneous TID WC  . insulin aspart  0-5 Units Subcutaneous QHS  . insulin aspart  8 Units Subcutaneous TID WC  . insulin detemir  30 Units Subcutaneous BID  . isosorbide mononitrate  30 mg Oral Daily  . mouth rinse  15 mL Mouth Rinse BID  . mupirocin ointment  1 application Nasal BID  . sodium chloride flush  3 mL Intravenous Q12H   Continuous Infusions: . sodium chloride    . sodium chloride    . heparin 1,400 Units/hr (03/21/17 0555)   PRN Meds: sodium chloride, acetaminophen, nitroGLYCERIN, ondansetron (ZOFRAN) IV, sodium chloride flush   Vital Signs    Vitals:   03/20/17 2333 03/21/17 0300 03/21/17 0438 03/21/17 0704  BP: 108/72  104/75 104/68  Pulse: 91 88 (!) 101 87  Resp: 13 19 (!) 28 13  Temp: 98.8 F (37.1 C)  98.6 F (37 C) 97.8 F (36.6 C)  TempSrc: Oral  Oral Oral  SpO2: 93% 94% 97% 98%  Weight:      Height:        Intake/Output Summary (Last 24 hours) at 03/21/17 0839 Last data filed at 03/21/17 0439  Gross per 24 hour  Intake             1143 ml  Output             1950 ml  Net             -807 ml   Filed Weights   03/17/17 1301  Weight: 164 lb 0.4 oz (74.4 kg)    Telemetry    SR to ST - Personally Reviewed  ECG    No new  - Personally Reviewed  Physical Exam    GEN: No acute distress - no increased WOB Neck: No JVD or bruit. Cardiac: RRR, Nl S1 & S2. No M/R/G. Laterally displaced PMI  Respiratory: CTAB, non-labored. No W/R/R GI: soft/  NT/ND/NABS MS: No C/C/E.  Neuro:  Grossly normal. Non-focal Psych: Pleasant mood & affect   Labs    Chemistry  Recent Labs Lab 03/18/17 0428 03/20/17 0327 03/21/17 0248  NA 137 136 136  K 4.1 4.1 4.0  CL 105 103 102  CO2 24 25 24   GLUCOSE 257* 200* 202*  BUN 13 13 16   CREATININE 0.76 0.74 0.84  CALCIUM 8.6* 9.5 9.5  PROT 6.5 6.5 7.1  ALBUMIN 3.2* 3.4* 3.4*  AST 21 71* 62*  ALT 20 76* 90*  ALKPHOS 37* 51 53  BILITOT 0.8 0.8 0.5  GFRNONAA >60 >60 >60  GFRAA >60 >60 >60  ANIONGAP 8 8 10      Hematology  Recent Labs Lab 03/19/17 0222 03/20/17 0327 03/21/17 0248  WBC 8.6 7.6 9.2  RBC 4.91 4.88 4.86  HGB 14.2 13.8 14.1  HCT 41.8 41.2 41.0  MCV 85.1 84.4 84.4  MCH 28.9 28.3 29.0  MCHC 34.0 33.5 34.4  RDW  12.9 13.3 12.9  PLT 171 182 190    Cardiac Enzymes  Recent Labs Lab 03/16/17 0450 03/16/17 0842 03/16/17 1426 03/16/17 2016  TROPONINI 0.47* 0.82* 1.64* 1.92*   No results for input(s): TROPIPOC in the last 168 hours.   BNP  Recent Labs Lab 03/16/17 0450  BNP 240.0*     DDimer No results for input(s): DDIMER in the last 168 hours.   Radiology    Ct Head W & Wo Contrast  Result Date: 03/19/2017 CLINICAL DATA:  F/U on lung cancer( for abd/pelvis too ). EXAM: CT HEAD WITHOUT AND WITH CONTRAST TECHNIQUE: Contiguous axial images were obtained from the base of the skull through the vertex without and with intravenous contrast CONTRAST:  100 ml isovue 300 iv COMPARISON:  11/01/2010 FINDINGS: Brain: Mild atrophy. No evidence of acute infarction, hemorrhage, hydrocephalus, extra-axial collection or mass lesion/mass effect. No areas of unexpected enhancement after IV contrast administration. Vascular: No hyperdense vessel or unexpected calcification. Visible vessels are patent.Calcified plaque in bilateral ICAs. Skull: Normal. Negative for fracture or focal lesion. Sinuses/Orbits: Mucoperiosteal thickening in bilateral maxillary sinuses. Retention cyst or  polyp in the right maxillary sinus. Remainder visualized paranasal sinuses and mastoid air cells appear normally developed and well aerated. Other: None IMPRESSION: 1. Negative for bleed, acute intracranial process, or metastatic disease. Electronically Signed   By: Lucrezia Europe M.D.   On: 03/19/2017 19:55   Ct Abdomen Pelvis W Contrast  Result Date: 03/19/2017 CLINICAL DATA:  Coronary artery disease. F/U on lung cancer, hilus EXAM: CT ABDOMEN AND PELVIS WITH CONTRAST TECHNIQUE: Multidetector CT imaging of the abdomen and pelvis was performed using the standard protocol following bolus administration of intravenous contrast. CONTRAST:  100 ml isovue 300 iv^<See Chart> ISOVUE-300 IOPAMIDOL (ISOVUE-300) INJECTION 61% COMPARISON:  CT chest 03/17/17; CT abdomen 03/25/2012 and previous FINDINGS: Lower chest: Small bilateral pleural effusions slightly improved. Improved aeration posteriorly in both lower lobes. Right perihilar soft tissue, incompletely visualized. Hepatobiliary: No focal liver abnormality is seen. No gallstones, gallbladder wall thickening, or biliary dilatation. Pancreas: Unremarkable. No pancreatic ductal dilatation or surrounding inflammatory changes. Spleen: Normal in size without focal abnormality. Adrenals/Urinary Tract: 13 mm soft tissue attenuation left adrenal nodule stable since 11/03/2010. Negative kidneys. No hydronephrosis or ureterectasis. Urinary bladder physiologically distended. Stomach/Bowel: Stomach, small bowel, colon nondilated. Appendix not discretely identified. No wall thickening or adjacent inflammatory/ edematous change identified. Vascular/Lymphatic: Moderate plaque in the infrarenal abdominal aorta and bilateral common iliac arteries. No aneurysm or high-grade stenosis. No abdominal or pelvic adenopathy localized. Early contrast opacification in the right common femoral and external iliac veins. Reproductive: Moderate prostatic enlargement with central coarse calcifications.  Other: No ascites.  No free air. Musculoskeletal: Spurring in the visualized lower thoracic and lumbar spine. Facet DJD L5-S1. No fracture or worrisome bone lesion. IMPRESSION: 1. Right perihilar disease, incompletely visualized. 2. Some interval decrease in bilateral pleural effusions. 3. No evidence of metastatic disease in the abdomen or pelvis. 4. Moderate infrarenal aortic and iliac arterial plaque. 5. 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 Note that the patient did have right femoral arterial access 03/16/2017 for cardiac catheterization. Consider ultrasound for further evaluation. Electronically Signed   By: Lucrezia Europe M.D.   On: 03/19/2017 19:52    Cardiac Studies   Carotid dopplers 03/20/17 - The vertebral arteries appear patent with antegrade flow. - Findings consistent with a 1- 44 percent stenosis involving the   right internal carotid artery and the  left internal carotid   artery. Calcified plaque obscuring bifurcations bilaterally.  LE arterial dopplers Summary: No obvious pseudoaneurysm or AVF noted on this exam  Procedures   Left Heart Cath and Coronary Angiography  Conclusion     Ost Cx to Prox Cx lesion, 75 %stenosed.  Prox LAD to Mid LAD lesion, 95 %stenosed.  Dist LAD lesion, 60 %stenosed.  Ost 1st Diag to 1st Diag lesion, 90 %stenosed.  Mid RCA lesion, 80 %stenosed.  Dist RCA lesion, 95 %stenosed.  Ost RPDA to RPDA lesion, 75 %stenosed.  2nd RPLB lesion, 75 %stenosed.  RPDA lesion, 90 %stenosed.   1. Severe three-vessel coronary artery disease with current crit high-grade stenosis mid LAD, 80% stenosis mid RCA with diffuse distal involvement, irregular 75% stenosis proximal left circumflex 2. Severe dilated cardiomyopathy  Recommendations  1. CABG 2. Probable ICD    Echo 03/16/17 Study Conclusions  - Left ventricle: The cavity size was moderately dilated. Systolic   function was  moderately to severely reduced. The estimated   ejection fraction was in the range of 30% to 35%. Akinesis of the   apical myocardium. - Aortic valve: There was mild regurgitation. Valve area (Vmax):   2.99 cm^2. - Mitral valve: There was mild regurgitation.   Patient Profile     56 y.o. male 56 y.o. male with CAD, acute systolic CHF, hx of lung cancer, IDDM, HLD, HTN.and significant CAD with cath and NSTEMI, need for CABG.   Assessment & Plan   Principal Problem:   Non-STEMI (non-ST elevated myocardial infarction) ([place]) Active Problems:   History of lung cancer   Coronary artery disease involving native coronary artery of native heart with angina pectoris (HCC)   Ischemic cardiomyopathy   Acute systolic congestive heart failure (HCC)   Hyperlipidemia with target low density lipoprotein (LDL) cholesterol less than 70 mg/dL   DM2 (diabetes mellitus, type 2) (HCC)    Principal Problem:   Non-STEMI (non-ST elevated myocardial infarction) ([place]) /Coronary artery disease involving native coronary artery of native heart with angina pectoris ([place]) - MV CAD on Cath - referred for CABG (planned for Friday)  Initially had peri-infarct Angina --> back on IV Heparin.  Now, no further Sx with increase BB & addition of Imdur   Active Problems:   History of lung cancer - per DR. Prescott Gum - would like RHC to measure PAP/RVP  Per CVTS - PFTs & CT chest ordered & reviewed - defer to their expertise.      Ischemic cardiomyopathy /   Acute systolic congestive heart failure (HCC)  Euvolemic on Exam; off of diuretic.  RHC today (Dr. Aundra Dubin) for PAP/PCWP & RVP as part of pre-op eval. --> if #s are relatively controlled, can transfer to [facility] today.  LFTs trending back down - if still high would hold statin.    Hyperlipidemia with target low density lipoprotein (LDL) cholesterol less than 70 mg/dL -- on high dose Statin   DM2 (diabetes mellitus, type 2) (HCC) - glycemic control not quite  adequate on BID Levimer / Meal Coverage & SSI -->   per DM C/s team, will increase Levimer to 30 Units BID & meal coverage insulin to 8 Units (holding today for RHC)  Signed, Glenetta Hew, MD  03/21/2017, 8:39 AM    Glenetta Hew, M.D., M.S. Interventional Cardiologist   Pager # [phone] Phone # [phone] [address]. [place], [place] [address]

## 2017-03-21 NOTE — Progress Notes (Signed)
Inpatient Diabetes Program Recommendations  AACE/ADA: New Consensus Statement on Inpatient Glycemic Control (2015)  Target Ranges:  Prepandial:   less than 140 mg/dL      Peak postprandial:   less than 180 mg/dL (1-2 hours)      Critically ill patients:  140 - 180 mg/dL   Results for George Griffin, George Griffin (MRN [id number]) as of 03/21/2017 07:38  Ref. Range 03/20/2017 07:27 03/20/2017 08:17 03/20/2017 11:20 03/20/2017 17:01 03/20/2017 20:58  Glucose-Capillary Latest Ref Range: 65 - 99 mg/dL 184 (H) 229 (H) 235 (H) 260 (H) 237 (H)  Results for George Griffin, George Griffin (MRN [id number]) as of 03/21/2017 07:38  Ref. Range 03/20/2017 03:27 03/21/2017 02:48  Glucose Latest Ref Range: 65 - 99 mg/dL 200 (H) 202 (H)  Hemoglobin A1C Latest Ref Range: 4.8 - 5.6 % 11.7 (H)    Review of Glycemic Control  Current orders for Inpatient glycemic control: Levemir 28 units BID, Novolog 5 units TID with meals, Novolog 0-15 units TID with meals, Novolog 0-5 units QHS  Inpatient Diabetes Program Recommendations:  Insulin - Basal: Please consider increasing Levemir to 30 units BID. Insulin - Meal Coverage: Please consider increasing meal coverage to Novolog 8 units TID with meals.  Thanks, Barnie Alderman, RN, MSN, CDE Diabetes Coordinator Inpatient Diabetes Program [phone] (Team Pager from 8am to 5pm)

## 2017-03-21 NOTE — Progress Notes (Addendum)
Procedure(s) (LRB): CORONARY ARTERY BYPASS GRAFTING (CABG) (N/A) TRANSESOPHAGEAL ECHOCARDIOGRAM (TEE) (N/A) Subjective: Non-ST elevation MI Severe three-vessel coronary disease-diabetic pattern Severe LV dysfunction History of right hilar lung cancer treated with radiation therapy and chemotherapy PFTs demonstrate FVC 2.7, FEV1 2.1, DLCO 56% Follow-up CT scans of chest and abdomen show no evidence of recurrent cancer Right heart cath is pending preop due to patient's ischemic cardiomyopathy and no LVEDP determination at the time of catheterization at [place] multivessel CABG Friday 7-27  Objective: Vital signs in last 24 hours: Temp:  [97.8 F (36.6 C)-98.9 F (37.2 C)] 98.4 F (36.9 C) (07/25 1519) Pulse Rate:  [87-101] 88 (07/25 1519) Cardiac Rhythm: Normal sinus rhythm;Other (Comment) (07/25 0704) Resp:  [10-28] 10 (07/25 1519) BP: (96-114)/(64-75) 96/64 (07/25 1519) SpO2:  [93 %-98 %] 95 % (07/25 1519)  Hemodynamic parameters for last 24 hours:  sinus rhythm  Intake/Output from previous day: 07/24 0701 - 07/25 0700 In: 1143 [P.O.:807; I.V.:336] Out: 2750 [Urine:2750] Intake/Output this shift: No intake/output data recorded.       Exam    General- alert and comfortable   Lungs- clear without rales, wheezes   Cor- regular rate and rhythm, no murmur , gallop   Abdomen- soft, non-tender   Extremities - warm, non-tender, minimal edema   Neuro- oriented, appropriate, no focal weakness   Lab Results:  Recent Labs  03/20/17 0327 03/21/17 0248  WBC 7.6 9.2  HGB 13.8 14.1  HCT 41.2 41.0  PLT 182 190   BMET:  Recent Labs  03/20/17 0327 03/21/17 0248  NA 136 136  K 4.1 4.0  CL 103 102  CO2 25 24  GLUCOSE 200* 202*  BUN 13 16  CREATININE 0.74 0.84  CALCIUM 9.5 9.5    PT/INR: No results for input(s): LABPROT, INR in the last 72 hours. ABG    Component Value Date/Time   PHART 7.425 03/18/2017 0255   HCO3 24.1 03/18/2017 0255   TCO2 27  12/04/2007 1742   O2SAT 90.3 03/18/2017 0255   CBG (last 3)   Recent Labs  03/20/17 2058 03/21/17 0750 03/21/17 1258  GLUCAP 237* 223* 246*    Assessment/Plan: S/P Procedure(s) (LRB): CORONARY ARTERY BYPASS GRAFTING (CABG) (N/A) TRANSESOPHAGEAL ECHOCARDIOGRAM (TEE) (N/A) Continue heparin Contro blood sugar-A1c 11.1 Right heart cath in a.m. CABG on Friday 7-27 if no severe pulmonary hypertension found at cath   LOS: 4 days    Tharon Aquas Trigt III 03/21/2017

## 2017-03-22 ENCOUNTER — Encounter (HOSPITAL_COMMUNITY)
Admission: AD | Disposition: A | Discharge status: Home / Self Care | Payer: Self-pay | Source: Other Acute Inpatient Hospital | Attending: Cardiothoracic Surgery

## 2017-03-22 ENCOUNTER — Encounter (HOSPITAL_COMMUNITY): Payer: Self-pay | Admitting: Cardiology

## 2017-03-22 DIAGNOSIS — E1165 Type 2 diabetes mellitus with hyperglycemia: Secondary | ICD-10-CM

## 2017-03-22 LAB — BASIC METABOLIC PANEL
ANION GAP: 10 (ref 5–15)
BUN: 19 mg/dL (ref 6–20)
CHLORIDE: 103 mmol/L (ref 101–111)
CO2: 22 mmol/L (ref 22–32)
Calcium: 9.2 mg/dL (ref 8.9–10.3)
Creatinine, Ser: 0.8 mg/dL (ref 0.61–1.24)
GFR calc Af Amer: >60 mL/min (ref >60)
GLUCOSE: 236 mg/dL — AB (ref 65–99)
POTASSIUM: 4.6 mmol/L (ref 3.5–5.1)
Sodium: 135 mmol/L (ref 135–145)

## 2017-03-22 LAB — GLUCOSE, CAPILLARY
GLUCOSE-CAPILLARY: 248 mg/dL — AB (ref 65–99)
GLUCOSE-CAPILLARY: 352 mg/dL — AB (ref 65–99)
GLUCOSE-CAPILLARY: 250 mg/dL — AB (ref 65–99)
Glucose-Capillary: 341 mg/dL — ABNORMAL HIGH (ref 65–99)

## 2017-03-22 LAB — CBC
HEMATOCRIT: 41.7 % (ref 39.0–52.0)
HEMOGLOBIN: 14.1 g/dL (ref 13.0–17.0)
MCH: 28.6 pg (ref 26.0–34.0)
MCHC: 33.8 g/dL (ref 30.0–36.0)
MCV: 84.6 fL (ref 78.0–100.0)
Platelets: 201 10*3/uL (ref 150–400)
RBC: 4.93 MIL/uL (ref 4.22–5.81)
RDW: 13 % (ref 11.5–15.5)
WBC: 9.1 10*3/uL (ref 4.0–10.5)

## 2017-03-22 LAB — ABO/RH: ABO/RH(D): B POS

## 2017-03-22 LAB — HEPARIN LEVEL (UNFRACTIONATED)
HEPARIN UNFRACTIONATED: 0.49 [IU]/mL (ref 0.30–0.70)
Heparin Unfractionated: 0.35 IU/mL (ref 0.30–0.70)

## 2017-03-22 LAB — HIV ANTIBODY (ROUTINE TESTING W REFLEX): HIV Screen 4th Generation wRfx: NONREACTIVE

## 2017-03-22 LAB — PROTIME-INR
INR: 1.09
Prothrombin Time: 14.1 seconds (ref 11.4–15.2)

## 2017-03-22 LAB — PREPARE RBC (CROSSMATCH)

## 2017-03-22 SURGERY — RIGHT HEART CATH
Anesthesia: LOCAL

## 2017-03-22 MED ORDER — TRANEXAMIC ACID (OHS) BOLUS VIA INFUSION
15.0000 mg/kg | INTRAVENOUS | Status: AC
  Administered 2017-03-23: 1116 mg via INTRAVENOUS
  Filled 2017-03-22: qty 1116

## 2017-03-22 MED ORDER — DEXTROSE 5 % IV SOLN
750.0000 mg | INTRAVENOUS | Status: DC
  Filled 2017-03-22: qty 750

## 2017-03-22 MED ORDER — HEPARIN SODIUM (PORCINE) 1000 UNIT/ML IJ SOLN
INTRAMUSCULAR | Status: DC
  Filled 2017-03-22: qty 30

## 2017-03-22 MED ORDER — MAGNESIUM SULFATE 50 % IJ SOLN
40.0000 meq | INTRAMUSCULAR | Status: DC
  Filled 2017-03-22: qty 10

## 2017-03-22 MED ORDER — CHLORHEXIDINE GLUCONATE 0.12 % MT SOLN: 15.0000 mL | Freq: Once | OROMUCOSAL | Status: DC

## 2017-03-22 MED ORDER — BISACODYL 5 MG PO TBEC
5.0000 mg | DELAYED_RELEASE_TABLET | Freq: Once | ORAL | Status: AC
  Administered 2017-03-22: 5 mg via ORAL
  Filled 2017-03-22: qty 1

## 2017-03-22 MED ORDER — SODIUM CHLORIDE 0.9 % IV SOLN
30.0000 ug/min | INTRAVENOUS | Status: AC
  Administered 2017-03-23: 40 ug/min via INTRAVENOUS
  Filled 2017-03-22: qty 2

## 2017-03-22 MED ORDER — TRANEXAMIC ACID 1000 MG/10ML IV SOLN
1.5000 mg/kg/h | INTRAVENOUS | Status: AC
  Administered 2017-03-23: 1.5 mg/kg/h via INTRAVENOUS
  Filled 2017-03-22: qty 25

## 2017-03-22 MED ORDER — POTASSIUM CHLORIDE 2 MEQ/ML IV SOLN
80.0000 meq | INTRAVENOUS | Status: DC
  Filled 2017-03-22: qty 40

## 2017-03-22 MED ORDER — TRANEXAMIC ACID (OHS) PUMP PRIME SOLUTION
2.0000 mg/kg | INTRAVENOUS | Status: DC
  Filled 2017-03-22: qty 1.49

## 2017-03-22 MED ORDER — MAGNESIUM HYDROXIDE 400 MG/5ML PO SUSP
30.0000 mL | Freq: Every day | ORAL | Status: DC | PRN
  Administered 2017-03-22: 30 mL via ORAL
  Filled 2017-03-22: qty 30

## 2017-03-22 MED ORDER — CARVEDILOL 3.125 MG PO TABS
3.1250 mg | ORAL_TABLET | Freq: Once | ORAL | Status: AC
  Administered 2017-03-23: 3.125 mg via ORAL
  Filled 2017-03-22: qty 1

## 2017-03-22 MED ORDER — SODIUM CHLORIDE 0.9 % IV SOLN
INTRAVENOUS | Status: AC
  Administered 2017-03-23: 3 [IU]/h via INTRAVENOUS
  Filled 2017-03-22: qty 1

## 2017-03-22 MED ORDER — NITROGLYCERIN IN D5W 200-5 MCG/ML-% IV SOLN
2.0000 ug/min | INTRAVENOUS | Status: DC
  Filled 2017-03-22: qty 250

## 2017-03-22 MED ORDER — CHLORHEXIDINE GLUCONATE 4 % EX LIQD: 60.0000 mL | Freq: Once | CUTANEOUS | Status: DC

## 2017-03-22 MED ORDER — INSULIN DETEMIR 100 UNIT/ML ~~LOC~~ SOLN
30.0000 [IU] | Freq: Two times a day (BID) | SUBCUTANEOUS | Status: DC
  Administered 2017-03-22 (×2): 30 [IU] via SUBCUTANEOUS
  Filled 2017-03-22 (×4): qty 0.3

## 2017-03-22 MED ORDER — DEXTROSE 5 % IV SOLN
1.5000 g | INTRAVENOUS | Status: AC
  Administered 2017-03-23: 1.5 g via INTRAVENOUS
  Administered 2017-03-23: .75 g via INTRAVENOUS
  Filled 2017-03-22: qty 1.5

## 2017-03-22 MED ORDER — VANCOMYCIN HCL 10 G IV SOLR
1250.0000 mg | INTRAVENOUS | Status: AC
  Administered 2017-03-23: 1250 mg via INTRAVENOUS
  Filled 2017-03-22: qty 1250

## 2017-03-22 MED ORDER — PLASMA-LYTE 148 IV SOLN
INTRAVENOUS | Status: AC
  Administered 2017-03-23: 14:00:00
  Filled 2017-03-22: qty 2.5

## 2017-03-22 MED ORDER — TEMAZEPAM 15 MG PO CAPS: 15.0000 mg | ORAL_CAPSULE | Freq: Once | ORAL | Status: DC | PRN

## 2017-03-22 MED ORDER — DOPAMINE-DEXTROSE 3.2-5 MG/ML-% IV SOLN
0.0000 ug/kg/min | INTRAVENOUS | Status: AC
  Administered 2017-03-23: 3 ug/kg/min via INTRAVENOUS
  Filled 2017-03-22: qty 250

## 2017-03-22 MED ORDER — DEXMEDETOMIDINE HCL IN NACL 400 MCG/100ML IV SOLN
0.1000 ug/kg/h | INTRAVENOUS | Status: AC
  Administered 2017-03-23: .2 ug/kg/h via INTRAVENOUS
  Filled 2017-03-22: qty 100

## 2017-03-22 MED ORDER — EPINEPHRINE PF 1 MG/ML IJ SOLN
0.0000 ug/min | INTRAVENOUS | Status: DC
  Filled 2017-03-22: qty 4

## 2017-03-22 MED ORDER — INSULIN DETEMIR 100 UNIT/ML ~~LOC~~ SOLN
30.0000 [IU] | Freq: Two times a day (BID) | SUBCUTANEOUS | Status: DC
  Filled 2017-03-22: qty 0.3

## 2017-03-22 NOTE — Progress Notes (Signed)
Pt given print out information in [place] regarding CABG tomorrow.

## 2017-03-22 NOTE — Progress Notes (Signed)
Progress Note  Patient Name: George Griffin Date of Encounter: 03/22/2017  Primary Cardiologist: Paraschos   Subjective   No major complaints today. Lying comfortably in bed no dyspnea, palpitations or chest pain. He is happy about right heart cath results Excited for CABG tomorrow  Inpatient Medications    Scheduled Meds: . aspirin EC  81 mg Oral Daily  . atorvastatin  80 mg Oral q1800  . bisacodyl  5 mg Oral Once  . [START ON 03/23/2017] carvedilol  3.125 mg Oral Once  . carvedilol  9.375 mg Oral BID WC  . chlorhexidine  60 mL Topical Once   And  . [START ON 03/23/2017] chlorhexidine  60 mL Topical Once  . [START ON 03/23/2017] chlorhexidine  15 mL Mouth/Throat Once  . Chlorhexidine Gluconate Cloth  6 each Topical Daily  . feeding supplement (GLUCERNA SHAKE)  237 mL Oral TID BM  . insulin aspart  0-15 Units Subcutaneous TID WC  . insulin aspart  0-5 Units Subcutaneous QHS  . insulin aspart  8 Units Subcutaneous TID WC  . isosorbide mononitrate  30 mg Oral Daily  . mouth rinse  15 mL Mouth Rinse BID  . mupirocin ointment  1 application Nasal BID  . sodium chloride flush  3 mL Intravenous Q12H   Continuous Infusions: . sodium chloride    . heparin 1,400 Units/hr (03/22/17 0130)   PRN Meds: sodium chloride, acetaminophen, nitroGLYCERIN, ondansetron (ZOFRAN) IV, sodium chloride flush, temazepam   Vital Signs    Vitals:   03/21/17 1721 03/21/17 1942 03/22/17 0000 03/22/17 0400  BP: 107/70  112/74 112/77  Pulse: 93  97 93  Resp: 12  17 17   Temp:  99.1 F (37.3 C) 99.1 F (37.3 C) 98.5 F (36.9 C)  TempSrc:  Oral Oral Oral  SpO2: 94%  94% 94%  Weight:      Height:        Intake/Output Summary (Last 24 hours) at 03/22/17 0748 Last data filed at 03/22/17 0500  Gross per 24 hour  Intake           190.17 ml  Output             1400 ml  Net         -1209.83 ml   Filed Weights   03/17/17 1301  Weight: 164 lb 0.4 oz (74.4 kg)    Telemetry    Continues to have  sinus rhythm with occasional sinus tachycardia and PACs- Personally Reviewed  ECG    No new  - Personally Reviewed  Physical Exam    GEN: NAD. Pleasant. Healthy-appearing Neck:  no JVD or bruit Cardiac: RRR, normal S1-S2, no M/R/G. Laterally displaced PMI but non-sustained Respiratory:  CTA B, nonlabored, and no W/R/R GI: Soft/NT/ND/NABS MS:  no C/C/E Neuro:   nonfocal Psych: Pleasant mood and affect  Labs    Chemistry  Recent Labs Lab 03/18/17 0428 03/20/17 0327 03/21/17 0248 03/22/17 0442  NA 137 136 136 135  K 4.1 4.1 4.0 4.6  CL 105 103 102 103  CO2 24 25 24 22   GLUCOSE 257* 200* 202* 236*  BUN 13 13 16 19   CREATININE 0.76 0.74 0.84 0.80  CALCIUM 8.6* 9.5 9.5 9.2  PROT 6.5 6.5 7.1  --   ALBUMIN 3.2* 3.4* 3.4*  --   AST 21 71* 62*  --   ALT 20 76* 90*  --   ALKPHOS 37* 51 53  --   BILITOT 0.8 0.8  0.5  --   GFRNONAA >60 >60 >60 >60  GFRAA >60 >60 >60 >60  ANIONGAP 8 8 10 10      Hematology  Recent Labs Lab 03/20/17 0327 03/21/17 0248 03/22/17 0442  WBC 7.6 9.2 9.1  RBC 4.88 4.86 4.93  HGB 13.8 14.1 14.1  HCT 41.2 41.0 41.7  MCV 84.4 84.4 84.6  MCH 28.3 29.0 28.6  MCHC 33.5 34.4 33.8  RDW 13.3 12.9 13.0  PLT 182 190 201    Cardiac Enzymes  Recent Labs Lab 03/16/17 0450 03/16/17 0842 03/16/17 1426 03/16/17 2016  TROPONINI 0.47* 0.82* 1.64* 1.92*   No results for input(s): TROPIPOC in the last 168 hours.   BNP  Recent Labs Lab 03/16/17 0450  BNP 240.0*     DDimer No results for input(s): DDIMER in the last 168 hours.   Radiology    No results found.  Cardiac Studies   Carotid dopplers 03/20/17 - The vertebral arteries appear patent with antegrade flow. - Findings consistent with a 1- 1 percent stenosis involving the   right internal carotid artery and the left internal carotid   artery. Calcified plaque obscuring bifurcations bilaterally.  LE arterial dopplers Summary: No obvious pseudoaneurysm or AVF noted on this  exam  Procedures   Left Heart Cath and Coronary Angiography  Conclusion     Ost Cx to Prox Cx lesion, 75 %stenosed.  Prox LAD to Mid LAD lesion, 95 %stenosed.  Dist LAD lesion, 60 %stenosed.  Ost 1st Diag to 1st Diag lesion, 90 %stenosed.  Mid RCA lesion, 80 %stenosed.  Dist RCA lesion, 95 %stenosed.  Ost RPDA to RPDA lesion, 75 %stenosed.  2nd RPLB lesion, 75 %stenosed.  RPDA lesion, 90 %stenosed.   1. Severe three-vessel coronary artery disease with current crit high-grade stenosis mid LAD, 80% stenosis mid RCA with diffuse distal involvement, irregular 75% stenosis proximal left circumflex 2. Severe dilated cardiomyopathy  Recommendations  1. CABG 2. Probable ICD    Echo 03/16/17 Study Conclusions  - Left ventricle: The cavity size was moderately dilated. Systolic   function was moderately to severely reduced. The estimated   ejection fraction was in the range of 30% to 35%. Akinesis of the   apical myocardium. - Aortic valve: There was mild regurgitation. Valve area (Vmax):   2.99 cm^2. - Mitral valve: There was mild regurgitation.  Right Heart Cath 03/20/2017:  Normal right heart and left heart filling pressures.: RAP 4 mmHg, RVP 23/5 mmHg, PAP-mean 26/8-18mmHg, PCO2 be 7 mmHg  Low cardiac index, but adequate of inotropes: Cardiac output 3.72, index 2.1  Patient Profile     56 y.o. male 56 y.o. male with CAD, acute systolic CHF, hx of lung cancer, IDDM, HLD, HTN.and significant CAD with cath and NSTEMI, need for CABG.   Assessment & Plan   Principal Problem:   Non-STEMI (non-ST elevated myocardial infarction) ([facility]) Active Problems:   History of lung cancer   Coronary artery disease involving native coronary artery of native heart with angina pectoris (HCC)   Ischemic cardiomyopathy   Acute systolic congestive heart failure (HCC)   Hyperlipidemia with target low density lipoprotein (LDL) cholesterol less than 70 mg/dL   DM2 (diabetes  mellitus, type 2) (HCC)    Principal Problem:    Non-STEMI (non-ST elevated myocardial infarction) ([place]) /Coronary artery disease involving native coronary artery of native heart with angina pectoris ([place]) - MV CAD on Cath - referred for CABG (planned for Friday)  No further angina since titration to  higher dose carvedilol and Imdur.  On aspirin and high-dose statin.  Remains on IV heparin pending CABG Active Problems:   History of lung cancer - per DR. Lucianne Lei Trigt - normal right heart pressures.   Per CVTS - PFTs & CT chest ordered & reviewed - okay for CABG on Friday      Ischemic cardiomyopathy /   Acute systolic congestive heart failure (HCC)  Euvolemic on Exam; off of diuretic. - Confirmed by Right Heart Cath      Hyperlipidemia with target low density lipoprotein (LDL) cholesterol less than 70 mg/dL -- on high dose Statin   DM2 (diabetes mellitus, type 2) (HCC) - glycemic control not quite adequate on BID Levimer / Meal Coverage & SSI -->   Blood sugar still difficult control, I just increased the Levemir dosing and meal coverage dose yesterday will allow stabilization today, may need more aggressive control postop -  Okay to transfer to telemetry today if bed available  Signed, Glenetta Hew, MD  03/22/2017, 7:48 AM    Glenetta Hew, M.D., M.S. Interventional Cardiologist   Pager # [phone] Phone # [phone] [address] [place], [place] [address]

## 2017-03-22 NOTE — Progress Notes (Signed)
CARDIAC REHAB PHASE I   PRE:  Rate/Rhythm: 95 SR  BP:  Sitting: 113/78        SaO2: 96 RA  MODE:  Ambulation: 680 ft   POST:  Rate/Rhythm: 103 ST  BP:  Sitting: 113/78         SaO2: 96 RA  Pt ambulated 680 ft on RA, IV, assist x1 (to push IV, otherwise independent), steady gait, tolerated well with no complaints. Pt states he feels better today, states he is nervous about surgery tomorrow. Pt to recliner after walk, call bell within reach. Will follow post-op.   [id number] Lenna Sciara, RN, BSN 03/22/2017 9:04 AM

## 2017-03-22 NOTE — Progress Notes (Signed)
[facility] for Heparin Indication: chest pain/ACS  No Known Allergies  Patient Measurements: Height: 5\' 3"  (160 cm) Weight: 164 lb 0.4 oz (74.4 kg) IBW/kg (Calculated) : 56.9 Heparin Dosing Weight: 72  Vital Signs: Temp: 98.8 F (37.1 C) (07/26 0804) Temp Source: Oral (07/26 0804) BP: 113/78 (07/26 0804) Pulse Rate: 90 (07/26 0804)  Labs:  Recent Labs  03/20/17 0327 03/21/17 0248 03/22/17 0442 03/22/17 0755  HGB 13.8 14.1 14.1  --   HCT 41.2 41.0 41.7  --   PLT 182 190 201  --   LABPROT  --   --  14.1  --   INR  --   --  1.09  --   HEPARINUNFRC 0.52 0.62  --  0.35  CREATININE 0.74 0.84 0.80  --     Estimated Creatinine Clearance: 93.2 mL/min (by C-G formula based on SCr of 0.8 mg/dL).  Assessment: 69 yoM with NSTEMI s/p RHC on 7/25. Pharmacy consulted to restart heparin in anticipation of CABG 7/27. Not on anticoagulation PTA  Heparin level therapeutic at 0.35 x 1 on restart. CBC wnl, no bleed documented.  Goal of Therapy:  Heparin level 0.3-0.7 units/ml Monitor platelets by anticoagulation protocol: Yes   Plan:  Start heparin 1400 units/hr Check 6 hour heparin level to confirm Monitor daily heparin level, CBC Monitor S/Sx of bleeding CABG 7/27   Elicia Lamp, PharmD, BCPS Clinical Pharmacist Rx Phone # for today: [phone] After 3:30PM, please call Main Rx: [phone] 03/22/2017 10:41 AM

## 2017-03-22 NOTE — Progress Notes (Signed)
[facility] for Heparin Indication: chest pain/ACS  Allergies  Allergen Reactions  . No Known Allergies     Patient Measurements: Height: 5\' 3"  (160 cm) Weight: 164 lb 0.4 oz (74.4 kg) IBW/kg (Calculated) : 56.9 Heparin Dosing Weight: 72  Vital Signs: Temp: 99 F (37.2 C) (07/26 1614) Temp Source: Oral (07/26 1614) BP: 97/59 (07/26 1614) Pulse Rate: 114 (07/26 1614)  Labs:  Recent Labs  03/20/17 0327 03/21/17 0248 03/22/17 0442 03/22/17 0755 03/22/17 1339  HGB 13.8 14.1 14.1  --   --   HCT 41.2 41.0 41.7  --   --   PLT 182 190 201  --   --   LABPROT  --   --  14.1  --   --   INR  --   --  1.09  --   --   HEPARINUNFRC 0.52 0.62  --  0.35 0.49  CREATININE 0.74 0.84 0.80  --   --     Estimated Creatinine Clearance: 93.2 mL/min (by C-G formula based on SCr of 0.8 mg/dL).  Assessment: 70 yoM with NSTEMI s/p RHC on 7/25. Pharmacy consulted to restart heparin in anticipation of CABG 7/27. Not on anticoagulation PTA  Heparin level remains therapeutic on heparin gtt. CBC wnl, no bleed documented.  Goal of Therapy:  Heparin level 0.3-0.7 units/ml Monitor platelets by anticoagulation protocol: Yes   Plan:  Continue IV heparin at 1400 units/hr Monitor daily heparin level, CBC Monitor S/Sx of bleeding CABG 7/27  Uvaldo Rising, BCPS  Clinical Pharmacist Pager [phone]  03/22/2017 4:47 PM

## 2017-03-22 NOTE — Progress Notes (Signed)
Received pt from [facility] oriented x4. Tele on. VS taken and recorded. Requested [facility] for prep. Pt for CABG in am.instructed NPO post midnight.

## 2017-03-22 NOTE — Care Management Note (Addendum)
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 Post Cardiac Cath 03-16-17 revealed 3 vessel CAD. Right Heart Cath 03-22-17. Plan for CABG 03-23-17. Pt lives in an apartment in [place], [place]. Pt's wife is in [place] for [place] x 1 month. Staff RN states pt had large amount of cash, $7,591 along with wallet that has 3 credit cards- money locked up. Pt has no PCP and no Insurance. Pt Needs PCP at d/c. Pt may can utilize Clinic in [facility] vs [facility] if has transportation.                    Action/Plan: CM will continue to monitor post procedure for disposition needs.   Expected Discharge Date:                  Expected Discharge Plan:  [place]  In-House Referral:  NA  Discharge planning Services  CM Consult  Post Acute Care Choice:  Home Health Choice offered to:     DME Arranged:    DME Agency:     HH Arranged:    HH Agency:     Status of Service:  In process, will continue to follow  If discussed at Long Length of Stay Meetings, dates discussed:    Additional Comments:  Bethena Roys, RN 03/22/2017, 3:30 PM

## 2017-03-22 NOTE — Progress Notes (Signed)
Patient transferred to 3E20 with all belongings.

## 2017-03-22 NOTE — Progress Notes (Signed)
1 Day Post-Op Procedure(s) (LRB): Right Heart Cath (N/A) Subjective: Ischemic cardiomyopathy, MI Hx stage III treated lung cancer RHC with normal R sided pressures, PFTs with low diffusion capacity, FEV1 ok CABG in am Objective: Vital signs in last 24 hours: Temp:  [98.4 F (36.9 C)-99.1 F (37.3 C)] 98.5 F (36.9 C) (07/26 0400) Pulse Rate:  [88-97] 93 (07/26 0400) Cardiac Rhythm: Normal sinus rhythm (07/26 0700) Resp:  [10-18] 17 (07/26 0400) BP: (96-129)/(63-88) 112/77 (07/26 0400) SpO2:  [93 %-97 %] 94 % (07/26 0400)  Hemodynamic parameters for last 24 hours:  stable  Intake/Output from previous day: 07/25 0701 - 07/26 0700 In: 190.2 [I.V.:190.2] Out: 1400 [Urine:1400] Intake/Output this shift: No intake/output data recorded.       Exam    General- alert and comfortable   Lungs- clear without rales, wheezes   Cor- regular rate and rhythm, no murmur , gallop   Abdomen- soft, non-tender   Extremities - warm, non-tender, minimal edema   Neuro- oriented, appropriate, no focal weakness   Lab Results:  Recent Labs  03/21/17 0248 03/22/17 0442  WBC 9.2 9.1  HGB 14.1 14.1  HCT 41.0 41.7  PLT 190 201   BMET:  Recent Labs  03/21/17 0248 03/22/17 0442  NA 136 135  K 4.0 4.6  CL 102 103  CO2 24 22  GLUCOSE 202* 236*  BUN 16 19  CREATININE 0.84 0.80  CALCIUM 9.5 9.2    PT/INR:  Recent Labs  03/22/17 0442  LABPROT 14.1  INR 1.09   ABG    Component Value Date/Time   PHART 7.425 03/18/2017 0255   HCO3 26.1 03/21/2017 1723   TCO2 27 03/21/2017 1723   O2SAT 59.0 03/21/2017 1723   CBG (last 3)   Recent Labs  03/21/17 1258 03/21/17 1745 03/21/17 2122  GLUCAP 246* 159* 297*    Assessment/Plan: S/P Procedure(s) (LRB): Right Heart Cath (N/A) CABG in am   LOS: 5 days    Tharon Aquas Trigt III 03/22/2017

## 2017-03-22 NOTE — Progress Notes (Addendum)
Inpatient Diabetes Program Recommendations  AACE/ADA: New Consensus Statement on Inpatient Glycemic Control (2015)  Target Ranges:  Prepandial:   less than 140 mg/dL      Peak postprandial:   less than 180 mg/dL (1-2 hours)      Critically ill patients:  140 - 180 mg/dL   Results for ERSKIN, ZINDA (MRN [id number]) as of 03/22/2017 12:57  Ref. Range 03/21/2017 07:50 03/21/2017 12:58 03/21/2017 17:45 03/21/2017 21:22 03/22/2017 08:00 03/22/2017 12:02  Glucose-Capillary Latest Ref Range: 65 - 99 mg/dL 223 (H) 246 (H) 159 (H) 297 (H) 248 (H) 352 (H)  Review of Glycemic Control  Current orders for Inpatient glycemic control: Novolog 0-15 units TID with meals, Novolog 0-5 units QHS  Inpatient Diabetes Program Recommendations: Insulin - Basal: Please reorder Levemir 30 units BID.  NOTE: In reviewing the chart, noted Levemir and meal coverage was placed on hold for heart cath yesterday but Levemir was never reordered. Current CBG up to 352 mg/dl and patient is planned to have CABG tomorrow. Recommend reordering Levemir.  Thanks, Barnie Alderman, RN, MSN, CDE Diabetes Coordinator Inpatient Diabetes Program [phone] (Team Pager from 8am to 5pm)

## 2017-03-22 NOTE — Discharge Instructions (Signed)
Ghp b?c c?u ??ng m?ch vnh (Coronary Artery Bypass Grafting) Ghp b?c c?u ??ng m?ch vnh (CABG) l m?t th? thu?t th?c hi?n ?? b?c c?u ho?c s?a ch?a cc ??ng m?ch c?a tim (??ng m?ch vnh) ? b? h?p l?i ho?c b? t?c. Tnh tr?ng h?p ny th??ng l k?t qu? c?a m?ng bm tch t? trong cc thnh m?ch. Cc ??ng m?ch vnh cung c?p xy v ch?t dinh d??ng c?n thi?t cho tim ?? b?m mu cho c? th? qu v?. Trong th? thu?t CABG, m?t ph?n c?a m?ch mu t? m?t b? ph?n khc c?a c? th? (th??ng l chn, cnh tay, ho?c thnh ng?c) ???c l?y ra v sau ? ???c ??t vo n?i m n s? cho php mu v??t qua ph?n h? h?ng c?a ??ng m?ch vnh.  HY CHO CHUYN GIA CH?M [place] S?C KH?E BI?T V?:  B?t k? v?n ?? d? ?ng no m qu v? c.   T?t c? cc lo?i thu?c m qu v? ?ang s? d?ng, bao g?m c? thu?c lm long mu, vitamin, th?o d??c, thu?c nh? m?t, v thu?c khng c?n k ??n.   Vi?c s? d?ng steroid (u?ng ho?c kem).   Nh?ng v?n ?? tr??c ?y qu v? ho?c ng??i trong gia ?nh qu v? ? b? khi s? d?ng thu?c gy m.   B?t k? cc b?nh l v? mu no m qu v? c.   Cc ph?u thu?t tr??c ? qu v? ? ???c lm.   Cc tnh tr?ng b?nh l m qu v? c.  NGUY C? V BI?N CH?NG Ni chung, ?y l m?t th? thu?t an ton. Tuy nhin, cc v?n ?? c th? x?y ra v bao g?m:   M?t mu.   ??t qu?Marland Kitchen   Nhi?m trng.   ?au t?i v?t m?.   Nh?i mu c? tim trong khi ho?c sau khi ph?u thu?t.   Suy th?n.  TR??C KHI TH?C HI?N TH? THU?T  Ch? s? d?ng thu?c theo ch? d?n c?a chuyn gia ch?m [place] s?c kh?e. Qu v? c th? ???c yu c?u b?t ??u dng cc lo?i thu?c m?i v ng?ng cc thu?c khc. Khng t? mnh d?ng thu?c ho?c ?i?u ch?nh li?u l??ng.  Khng ?n ho?c u?ng b?t k? th? g sau n?a ?m vo ?m tr??c khi lm th? thu?t ho?c theo ch? d?n c?a chuyn gia ch?m [facility] s?c kh?e. Hy h?i chuyn gia ch?m [place] y t? xem c th? u?ng m?t ng?m n??c v?i cc lo?i thu?c c?n thi?t hay khng. THU? THU?T Bc s? ph?u thu?t c th? s? d?ng k? thu?t m? ho?c k? thu?t xm l?n t?i  thi?u cho ph?u thu?t ny. Ph?u thu?t m? truy?n th?ng  Qu v? s? ???c cho dng thu?c ?? lm cho qu v? ng? trong qu trnh ph?u thu?t (gy m ton thn).  Khi qu v? ? ng?, m?t v?t c?t (v?t m?) s? ???c r?ch tr??c ng?c qua x??ng ng?c (x??ng ?c). X??ng ?c s? ???c m? r?ng m? ?? c th? nhn th?y tim c?a qu v?.  Sau ? qu v? s? ???c n?i v?i my tim-ph?i nhn t?o. My ny s? cung c?p oxy cho mu c?a qu v? khi tim ?ang ???c ph?u thu?t.  Tim qu v? sau ? s? t?m th?i d?ng l?i ?? bc s? ph?u thu?t c th? lm cc b??c ti?p theo. ? M?t ph?n c?a t?nh m?ch c kh? n?ng s? ???c l?y t? chn qu v? v ???c s? d?ng ?? b?c c?u cc ??ng m?ch b? t?c trong tim qu v?. ?  i khi cc ph?n ??ng m?ch t? bn trong thnh ng?c ho?c t? cnh tay c?a qu v? s? ???c s? d?ng, ho?c m?t mnh ho?c k?t h?p v?i t?nh m?ch chn. ? Khi vi?c b?c c?u ? xong, qu v? s? ???c ng?t kh?i my. ? Tim c?a qu v? s? kh?i ??ng l?i v s? l?i ho?t ??ng bnh th??ng. ? Ti xung quanh tim s? ???c ?ng l?i.  Sau ? ng?c qu v? s? ???c ?ng l?i b?ng ch? ph?u thu?t ho?c ghim.  Cc ?ng s? v?n ? trong ng?c qu v? v s? ???c k?t n?i v?i m?t thi?t b? ht ?? gip d?n l?u ch?t d?ch v lm ph?ng l?i ph?i. Ph?u thu?t xm l?n t?i thi?u K? thu?t ny ???c th?c hi?n t? m?t v?t r?ch trn vng ng?c tri c?a qu v?. N?u thch h?p, bc s? ph?u thu?t c th? khng ph?i lm ch?m ho?c d?ng tim c?a qu v?. N?u tnh tr?ng c?a qu v? cho php lm th? thu?t ny, qu v? th??ng s? m?t t mu h?n, t ?au h?n, th?i gian n?m vi?n ng?n h?n v h?i ph?c nhanh h?n so v?i ph?u thu?t m? truy?n th?ng. SAU KHI TH?C HI?N TH? THU?Angela Nevin v? s? ???c ??a ??n khu v?c ph?c h?i ?? theo di.  Qu v? c th? th?c d?y v?i m?t ?ng trong c? h?ng ?? gip qu v? th?. Qu v? c th? ???c k?t n?i v?i m?t my th?. Qu v? s? khng th? ni chuy?n khi v?n c ?ng ? c? h?ng. ?ng s? ???c l?y ra ngay sau khi an ton.  Qu v? s? l?o ??o v c th? ?au m?t cht. Qu v? s? ???c cho thu?c gi?m ?au ?? ki?m sot c?n  ?au. Thng tin ny khng nh?m m?c ?ch thay th? cho l?i khuyn m chuyn gia ch?m [place] s?c kh?e ni v?i qu v?. Hy b?o ??m qu v? ph?i th?o lu?n b?t k? v?n ?? g m qu v? c v?i chuyn gia ch?m [place] s?c kh?e c?a qu v?. Document Released: 09/10/2015 Elsevier Interactive Patient Education  2017 [employer].

## 2017-03-23 ENCOUNTER — Inpatient Hospital Stay (HOSPITAL_COMMUNITY)
Admission: AD | Disposition: A | Discharge status: Home / Self Care | Payer: Self-pay | Source: Other Acute Inpatient Hospital | Attending: Cardiothoracic Surgery

## 2017-03-23 ENCOUNTER — Inpatient Hospital Stay (HOSPITAL_COMMUNITY): Payer: Medicare Other

## 2017-03-23 ENCOUNTER — Inpatient Hospital Stay (HOSPITAL_COMMUNITY): Payer: Medicare Other | Admitting: Certified Registered Nurse Anesthetist

## 2017-03-23 ENCOUNTER — Ambulatory Visit (HOSPITAL_COMMUNITY): Payer: Medicare Other

## 2017-03-23 ENCOUNTER — Encounter (HOSPITAL_COMMUNITY): Payer: Self-pay | Admitting: Surgery

## 2017-03-23 DIAGNOSIS — I214 Non-ST elevation (NSTEMI) myocardial infarction: Secondary | ICD-10-CM

## 2017-03-23 DIAGNOSIS — I25110 Atherosclerotic heart disease of native coronary artery with unstable angina pectoris: Secondary | ICD-10-CM

## 2017-03-23 DIAGNOSIS — Z951 Presence of aortocoronary bypass graft: Secondary | ICD-10-CM

## 2017-03-23 HISTORY — PX: CORONARY ARTERY BYPASS GRAFT: SHX141

## 2017-03-23 HISTORY — PX: TEE WITHOUT CARDIOVERSION: SHX5443

## 2017-03-23 HISTORY — PX: ENDOVEIN HARVEST OF GREATER SAPHENOUS VEIN: SHX5059

## 2017-03-23 LAB — POCT I-STAT, CHEM 8
BUN: 21 mg/dL — AB (ref 6–20)
BUN: 19 mg/dL (ref 6–20)
BUN: 16 mg/dL (ref 6–20)
BUN: 15 mg/dL (ref 6–20)
BUN: 14 mg/dL (ref 6–20)
BUN: 14 mg/dL (ref 6–20)
CALCIUM ION: 1.2 mmol/L (ref 1.15–1.40)
CALCIUM ION: 1.05 mmol/L — AB (ref 1.15–1.40)
CALCIUM ION: 0.93 mmol/L — AB (ref 1.15–1.40)
CALCIUM ION: 1.01 mmol/L — AB (ref 1.15–1.40)
CHLORIDE: 101 mmol/L (ref 101–111)
CHLORIDE: 102 mmol/L (ref 101–111)
CHLORIDE: 98 mmol/L — AB (ref 101–111)
CHLORIDE: 99 mmol/L — AB (ref 101–111)
CREATININE: 0.5 mg/dL — AB (ref 0.61–1.24)
CREATININE: 0.5 mg/dL — AB (ref 0.61–1.24)
CREATININE: 0.4 mg/dL — AB (ref 0.61–1.24)
Calcium, Ion: 1.25 mmol/L (ref 1.15–1.40)
Calcium, Ion: 0.94 mmol/L — ABNORMAL LOW (ref 1.15–1.40)
Chloride: 101 mmol/L (ref 101–111)
Chloride: 101 mmol/L (ref 101–111)
Creatinine, Ser: 0.6 mg/dL — ABNORMAL LOW (ref 0.61–1.24)
Creatinine, Ser: 0.4 mg/dL — ABNORMAL LOW (ref 0.61–1.24)
Creatinine, Ser: 0.5 mg/dL — ABNORMAL LOW (ref 0.61–1.24)
GLUCOSE: 130 mg/dL — AB (ref 65–99)
GLUCOSE: 113 mg/dL — AB (ref 65–99)
GLUCOSE: 93 mg/dL (ref 65–99)
Glucose, Bld: 176 mg/dL — ABNORMAL HIGH (ref 65–99)
Glucose, Bld: 118 mg/dL — ABNORMAL HIGH (ref 65–99)
Glucose, Bld: 166 mg/dL — ABNORMAL HIGH (ref 65–99)
HCT: 38 % — ABNORMAL LOW (ref 39.0–52.0)
HCT: 35 % — ABNORMAL LOW (ref 39.0–52.0)
HCT: 29 % — ABNORMAL LOW (ref 39.0–52.0)
HCT: 26 % — ABNORMAL LOW (ref 39.0–52.0)
HCT: 26 % — ABNORMAL LOW (ref 39.0–52.0)
HEMATOCRIT: 27 % — AB (ref 39.0–52.0)
HEMOGLOBIN: 9.2 g/dL — AB (ref 13.0–17.0)
Hemoglobin: 12.9 g/dL — ABNORMAL LOW (ref 13.0–17.0)
Hemoglobin: 11.9 g/dL — ABNORMAL LOW (ref 13.0–17.0)
Hemoglobin: 9.9 g/dL — ABNORMAL LOW (ref 13.0–17.0)
Hemoglobin: 8.8 g/dL — ABNORMAL LOW (ref 13.0–17.0)
Hemoglobin: 8.8 g/dL — ABNORMAL LOW (ref 13.0–17.0)
POTASSIUM: 4.3 mmol/L (ref 3.5–5.1)
POTASSIUM: 4.3 mmol/L (ref 3.5–5.1)
Potassium: 3.7 mmol/L (ref 3.5–5.1)
Potassium: 4.4 mmol/L (ref 3.5–5.1)
Potassium: 4.2 mmol/L (ref 3.5–5.1)
Potassium: 4.7 mmol/L (ref 3.5–5.1)
SODIUM: 138 mmol/L (ref 135–145)
SODIUM: 139 mmol/L (ref 135–145)
Sodium: 138 mmol/L (ref 135–145)
Sodium: 139 mmol/L (ref 135–145)
Sodium: 139 mmol/L (ref 135–145)
Sodium: 140 mmol/L (ref 135–145)
TCO2: 29 mmol/L (ref 0–100)
TCO2: 28 mmol/L (ref 0–100)
TCO2: 29 mmol/L (ref 0–100)
TCO2: 28 mmol/L (ref 0–100)
TCO2: 26 mmol/L (ref 0–100)
TCO2: 28 mmol/L (ref 0–100)

## 2017-03-23 LAB — POCT I-STAT 3, ART BLOOD GAS (G3+)
ACID-BASE EXCESS: 3 mmol/L — AB (ref 0.0–2.0)
ACID-BASE EXCESS: 2 mmol/L (ref 0.0–2.0)
Acid-Base Excess: 3 mmol/L — ABNORMAL HIGH (ref 0.0–2.0)
BICARBONATE: 29.3 mmol/L — AB (ref 20.0–28.0)
BICARBONATE: 27 mmol/L (ref 20.0–28.0)
Bicarbonate: 29 mmol/L — ABNORMAL HIGH (ref 20.0–28.0)
O2 SAT: 100 %
O2 SAT: 100 %
O2 Saturation: 100 %
PCO2 ART: 49.3 mmHg — AB (ref 32.0–48.0)
PCO2 ART: 51.3 mmHg — AB (ref 32.0–48.0)
PH ART: 7.382 (ref 7.350–7.450)
PO2 ART: 330 mmHg — AB (ref 83.0–108.0)
TCO2: 31 mmol/L (ref 0–100)
TCO2: 31 mmol/L (ref 0–100)
TCO2: 28 mmol/L (ref 0–100)
pCO2 arterial: 43.3 mmHg (ref 32.0–48.0)
pH, Arterial: 7.36 (ref 7.350–7.450)
pH, Arterial: 7.404 (ref 7.350–7.450)
pO2, Arterial: 460 mmHg — ABNORMAL HIGH (ref 83.0–108.0)
pO2, Arterial: 438 mmHg — ABNORMAL HIGH (ref 83.0–108.0)

## 2017-03-23 LAB — CBC
HCT: 27.8 % — ABNORMAL LOW (ref 39.0–52.0)
HEMATOCRIT: 39.7 % (ref 39.0–52.0)
HEMOGLOBIN: 13.4 g/dL (ref 13.0–17.0)
HEMOGLOBIN: 9.1 g/dL — AB (ref 13.0–17.0)
MCH: 28.6 pg (ref 26.0–34.0)
MCH: 27.9 pg (ref 26.0–34.0)
MCHC: 33.8 g/dL (ref 30.0–36.0)
MCHC: 32.7 g/dL (ref 30.0–36.0)
MCV: 84.6 fL (ref 78.0–100.0)
MCV: 85.3 fL (ref 78.0–100.0)
PLATELETS: 106 10*3/uL — AB (ref 150–400)
Platelets: 181 10*3/uL (ref 150–400)
RBC: 4.69 MIL/uL (ref 4.22–5.81)
RBC: 3.26 MIL/uL — AB (ref 4.22–5.81)
RDW: 12.9 % (ref 11.5–15.5)
RDW: 13 % (ref 11.5–15.5)
WBC: 9 10*3/uL (ref 4.0–10.5)
WBC: 6.3 10*3/uL (ref 4.0–10.5)

## 2017-03-23 LAB — GLUCOSE, CAPILLARY
Glucose-Capillary: 261 mg/dL — ABNORMAL HIGH (ref 65–99)
Glucose-Capillary: 169 mg/dL — ABNORMAL HIGH (ref 65–99)
Glucose-Capillary: 162 mg/dL — ABNORMAL HIGH (ref 65–99)

## 2017-03-23 LAB — BASIC METABOLIC PANEL
Anion gap: 8 (ref 5–15)
BUN: 22 mg/dL — AB (ref 6–20)
CHLORIDE: 101 mmol/L (ref 101–111)
CO2: 26 mmol/L (ref 22–32)
CREATININE: 0.92 mg/dL (ref 0.61–1.24)
Calcium: 9.2 mg/dL (ref 8.9–10.3)
GFR calc non Af Amer: >60 mL/min (ref >60)
Glucose, Bld: 220 mg/dL — ABNORMAL HIGH (ref 65–99)
POTASSIUM: 4.6 mmol/L (ref 3.5–5.1)
SODIUM: 135 mmol/L (ref 135–145)

## 2017-03-23 LAB — HEMOGLOBIN AND HEMATOCRIT, BLOOD
HCT: 24.7 % — ABNORMAL LOW (ref 39.0–52.0)
Hemoglobin: 8.2 g/dL — ABNORMAL LOW (ref 13.0–17.0)

## 2017-03-23 LAB — HEPARIN LEVEL (UNFRACTIONATED): HEPARIN UNFRACTIONATED: 0.74 [IU]/mL — AB (ref 0.30–0.70)

## 2017-03-23 LAB — PLATELET COUNT: Platelets: 102 10*3/uL — ABNORMAL LOW (ref 150–400)

## 2017-03-23 LAB — APTT: aPTT: 35 seconds (ref 24–36)

## 2017-03-23 LAB — PROTIME-INR
INR: 1.45
PROTHROMBIN TIME: 17.8 s — AB (ref 11.4–15.2)

## 2017-03-23 SURGERY — CORONARY ARTERY BYPASS GRAFTING (CABG)
Anesthesia: General | Site: Leg Upper | Laterality: Right

## 2017-03-23 MED ORDER — MORPHINE SULFATE (PF) 4 MG/ML IV SOLN
1.0000 mg | INTRAVENOUS | Status: AC | PRN
  Administered 2017-03-23: 2 mg via INTRAVENOUS
  Administered 2017-03-24 (×2): 4 mg via INTRAVENOUS
  Filled 2017-03-23 (×2): qty 1

## 2017-03-23 MED ORDER — LEVALBUTEROL HCL 0.63 MG/3ML IN NEBU: 0.6300 mg | INHALATION_SOLUTION | Freq: Four times a day (QID) | RESPIRATORY_TRACT | Status: DC | PRN

## 2017-03-23 MED ORDER — SODIUM CHLORIDE 0.9% FLUSH: 3.0000 mL | INTRAVENOUS | Status: DC | PRN

## 2017-03-23 MED ORDER — DEXTROSE 5 % IV SOLN
1.5000 g | Freq: Two times a day (BID) | INTRAVENOUS | Status: AC
  Administered 2017-03-24 – 2017-03-25 (×4): 1.5 g via INTRAVENOUS
  Filled 2017-03-23 (×4): qty 1.5

## 2017-03-23 MED ORDER — MIDAZOLAM HCL 2 MG/2ML IJ SOLN
2.0000 mg | INTRAMUSCULAR | Status: DC | PRN
  Filled 2017-03-23: qty 2

## 2017-03-23 MED ORDER — CHLORHEXIDINE GLUCONATE 0.12 % MT SOLN
15.0000 mL | OROMUCOSAL | Status: AC
Start: 2017-03-23 — End: 2017-03-23
  Administered 2017-03-23: 15 mL via OROMUCOSAL

## 2017-03-23 MED ORDER — ROCURONIUM BROMIDE 10 MG/ML (PF) SYRINGE
PREFILLED_SYRINGE | INTRAVENOUS | Status: AC
  Filled 2017-03-23: qty 5

## 2017-03-23 MED ORDER — LACTATED RINGERS IV SOLN: 500.0000 mL | Freq: Once | INTRAVENOUS | Status: DC | PRN

## 2017-03-23 MED ORDER — SODIUM CHLORIDE 0.9 % IV SOLN
0.0000 ug/kg/h | INTRAVENOUS | Status: DC
  Filled 2017-03-23: qty 2

## 2017-03-23 MED ORDER — METOPROLOL TARTRATE 25 MG/10 ML ORAL SUSPENSION: 12.5000 mg | Freq: Two times a day (BID) | ORAL | Status: DC

## 2017-03-23 MED ORDER — ARTIFICIAL TEARS OPHTHALMIC OINT
TOPICAL_OINTMENT | OPHTHALMIC | Status: DC | PRN
  Administered 2017-03-23: 1 via OPHTHALMIC

## 2017-03-23 MED ORDER — SODIUM CHLORIDE 0.9 % IV SOLN
INTRAVENOUS | Status: DC | PRN
  Administered 2017-03-23: 19:00:00 via INTRAVENOUS

## 2017-03-23 MED ORDER — ACETAMINOPHEN 160 MG/5ML PO SOLN: 650.0000 mg | Freq: Once | ORAL | Status: DC

## 2017-03-23 MED ORDER — NOREPINEPHRINE BITARTRATE 1 MG/ML IV SOLN
0.0000 ug/min | INTRAVENOUS | Status: DC
  Administered 2017-03-23: 2 ug/min via INTRAVENOUS
  Filled 2017-03-23: qty 4

## 2017-03-23 MED ORDER — DEXMEDETOMIDINE HCL IN NACL 200 MCG/50ML IV SOLN
INTRAVENOUS | Status: AC
  Filled 2017-03-23: qty 100

## 2017-03-23 MED ORDER — MILRINONE LACTATE IN DEXTROSE 20-5 MG/100ML-% IV SOLN
0.1250 ug/kg/min | INTRAVENOUS | Status: AC
  Administered 2017-03-23: .25 ug/kg/min via INTRAVENOUS
  Filled 2017-03-23: qty 100

## 2017-03-23 MED ORDER — ASPIRIN EC 325 MG PO TBEC
325.0000 mg | DELAYED_RELEASE_TABLET | Freq: Every day | ORAL | Status: DC
  Administered 2017-03-24 – 2017-03-31 (×8): 325 mg via ORAL
  Filled 2017-03-23 (×8): qty 1

## 2017-03-23 MED ORDER — FENTANYL CITRATE (PF) 250 MCG/5ML IJ SOLN
INTRAMUSCULAR | Status: AC
  Filled 2017-03-23: qty 20

## 2017-03-23 MED ORDER — PHENYLEPHRINE HCL 10 MG/ML IJ SOLN: 0.0000 ug/min | INTRAMUSCULAR | Status: DC

## 2017-03-23 MED ORDER — LACTATED RINGERS IV SOLN
INTRAVENOUS | Status: DC
Start: 2017-03-23 — End: 2017-03-23
  Administered 2017-03-23 (×4): via INTRAVENOUS

## 2017-03-23 MED ORDER — HEMOSTATIC AGENTS (NO CHARGE) OPTIME
TOPICAL | Status: DC | PRN
  Administered 2017-03-23 (×2): 1 via TOPICAL

## 2017-03-23 MED ORDER — METOPROLOL TARTRATE 5 MG/5ML IV SOLN: 2.5000 mg | INTRAVENOUS | Status: DC | PRN

## 2017-03-23 MED ORDER — CALCIUM CHLORIDE 10 % IV SOLN
INTRAVENOUS | Status: AC
  Filled 2017-03-23: qty 10

## 2017-03-23 MED ORDER — HEPARIN SODIUM (PORCINE) 1000 UNIT/ML IJ SOLN
INTRAMUSCULAR | Status: DC | PRN
  Administered 2017-03-23: 2000 [IU] via INTRAVENOUS
  Administered 2017-03-23: 30000 [IU] via INTRAVENOUS

## 2017-03-23 MED ORDER — SODIUM CHLORIDE 0.45 % IV SOLN: INTRAVENOUS | Status: DC | PRN

## 2017-03-23 MED ORDER — SODIUM CHLORIDE 0.9 % IV SOLN
20.0000 ug | Freq: Once | INTRAVENOUS | Status: AC
  Administered 2017-03-23: 20 ug via INTRAVENOUS
  Filled 2017-03-23: qty 5

## 2017-03-23 MED ORDER — ONDANSETRON HCL 4 MG/2ML IJ SOLN
4.0000 mg | Freq: Four times a day (QID) | INTRAMUSCULAR | Status: DC | PRN
  Administered 2017-03-24: 4 mg via INTRAVENOUS
  Filled 2017-03-23: qty 2

## 2017-03-23 MED ORDER — ACETAMINOPHEN 650 MG RE SUPP: 650.0000 mg | Freq: Once | RECTAL | Status: DC

## 2017-03-23 MED ORDER — CALCIUM CHLORIDE 10 % IV SOLN
INTRAVENOUS | Status: DC | PRN
  Administered 2017-03-23: 100 mg via INTRAVENOUS
  Administered 2017-03-23: 200 mg via INTRAVENOUS
  Administered 2017-03-23: 100 mg via INTRAVENOUS
  Administered 2017-03-23: 200 mg via INTRAVENOUS

## 2017-03-23 MED ORDER — LACTATED RINGERS IV SOLN: INTRAVENOUS | Status: DC

## 2017-03-23 MED ORDER — FENTANYL CITRATE (PF) 250 MCG/5ML IJ SOLN
INTRAMUSCULAR | Status: AC
  Filled 2017-03-23: qty 5

## 2017-03-23 MED ORDER — METOPROLOL TARTRATE 12.5 MG HALF TABLET
12.5000 mg | ORAL_TABLET | Freq: Two times a day (BID) | ORAL | Status: DC
  Administered 2017-03-24 – 2017-03-27 (×7): 12.5 mg via ORAL
  Filled 2017-03-23 (×7): qty 1

## 2017-03-23 MED ORDER — SODIUM CHLORIDE 0.9 % IV SOLN
INTRAVENOUS | Status: DC
  Filled 2017-03-23: qty 1

## 2017-03-23 MED ORDER — HEMOSTATIC AGENTS (NO CHARGE) OPTIME
TOPICAL | Status: DC | PRN
  Administered 2017-03-23: 1 via TOPICAL

## 2017-03-23 MED ORDER — SODIUM CHLORIDE 0.9 % IV SOLN
INTRAVENOUS | Status: DC
  Administered 2017-03-23: 20 mL/h via INTRAVENOUS

## 2017-03-23 MED ORDER — DOCUSATE SODIUM 100 MG PO CAPS
200.0000 mg | ORAL_CAPSULE | Freq: Every day | ORAL | Status: DC
  Administered 2017-03-24 – 2017-03-31 (×8): 200 mg via ORAL
  Filled 2017-03-23 (×8): qty 2

## 2017-03-23 MED ORDER — ALBUMIN HUMAN 5 % IV SOLN
250.0000 mL | INTRAVENOUS | Status: AC | PRN
  Administered 2017-03-23 – 2017-03-24 (×2): 250 mL via INTRAVENOUS
  Filled 2017-03-23: qty 250

## 2017-03-23 MED ORDER — MAGNESIUM SULFATE 4 GM/100ML IV SOLN
4.0000 g | Freq: Once | INTRAVENOUS | Status: AC
  Administered 2017-03-23: 4 g via INTRAVENOUS
  Filled 2017-03-23: qty 100

## 2017-03-23 MED ORDER — FAMOTIDINE IN NACL 20-0.9 MG/50ML-% IV SOLN
20.0000 mg | Freq: Two times a day (BID) | INTRAVENOUS | Status: AC
  Administered 2017-03-23 – 2017-03-24 (×2): 20 mg via INTRAVENOUS
  Filled 2017-03-23 (×2): qty 50

## 2017-03-23 MED ORDER — NITROGLYCERIN IN D5W 200-5 MCG/ML-% IV SOLN: 0.0000 ug/min | INTRAVENOUS | Status: DC

## 2017-03-23 MED ORDER — DOPAMINE-DEXTROSE 3.2-5 MG/ML-% IV SOLN: 0.0000 ug/kg/min | INTRAVENOUS | Status: DC

## 2017-03-23 MED ORDER — HEPARIN SODIUM (PORCINE) 1000 UNIT/ML IJ SOLN
INTRAMUSCULAR | Status: AC
  Filled 2017-03-23: qty 3

## 2017-03-23 MED ORDER — 0.9 % SODIUM CHLORIDE (POUR BTL) OPTIME
TOPICAL | Status: DC | PRN
  Administered 2017-03-23: 7000 mL

## 2017-03-23 MED ORDER — MIDAZOLAM HCL 5 MG/ML IJ SOLN
INTRAMUSCULAR | Status: DC | PRN
  Administered 2017-03-23 (×2): 2 mg via INTRAVENOUS
  Administered 2017-03-23: 4 mg via INTRAVENOUS
  Administered 2017-03-23 (×2): 2 mg via INTRAVENOUS

## 2017-03-23 MED ORDER — MORPHINE SULFATE (PF) 4 MG/ML IV SOLN
2.0000 mg | INTRAVENOUS | Status: DC | PRN
  Administered 2017-03-24 – 2017-03-25 (×7): 4 mg via INTRAVENOUS
  Filled 2017-03-23 (×8): qty 1

## 2017-03-23 MED ORDER — BISACODYL 5 MG PO TBEC
10.0000 mg | DELAYED_RELEASE_TABLET | Freq: Every day | ORAL | Status: DC
  Administered 2017-03-24 – 2017-03-29 (×6): 10 mg via ORAL
  Filled 2017-03-23 (×7): qty 2

## 2017-03-23 MED ORDER — MIDAZOLAM HCL 10 MG/2ML IJ SOLN
INTRAMUSCULAR | Status: AC
  Filled 2017-03-23: qty 2

## 2017-03-23 MED ORDER — ROCURONIUM BROMIDE 10 MG/ML (PF) SYRINGE
PREFILLED_SYRINGE | INTRAVENOUS | Status: DC | PRN
  Administered 2017-03-23: 30 mg via INTRAVENOUS
  Administered 2017-03-23: 50 mg via INTRAVENOUS
  Administered 2017-03-23: 30 mg via INTRAVENOUS
  Administered 2017-03-23: 20 mg via INTRAVENOUS
  Administered 2017-03-23: 50 mg via INTRAVENOUS

## 2017-03-23 MED ORDER — PHENYLEPHRINE 40 MCG/ML (10ML) SYRINGE FOR IV PUSH (FOR BLOOD PRESSURE SUPPORT)
PREFILLED_SYRINGE | INTRAVENOUS | Status: AC
  Filled 2017-03-23: qty 10

## 2017-03-23 MED ORDER — ASPIRIN 81 MG PO CHEW
324.0000 mg | CHEWABLE_TABLET | Freq: Every day | ORAL | Status: DC
  Filled 2017-03-23 (×2): qty 4

## 2017-03-23 MED ORDER — PHENYLEPHRINE 40 MCG/ML (10ML) SYRINGE FOR IV PUSH (FOR BLOOD PRESSURE SUPPORT)
PREFILLED_SYRINGE | INTRAVENOUS | Status: DC | PRN
  Administered 2017-03-23: 80 ug via INTRAVENOUS
  Administered 2017-03-23: 120 ug via INTRAVENOUS

## 2017-03-23 MED ORDER — VANCOMYCIN HCL IN DEXTROSE 1-5 GM/200ML-% IV SOLN
1000.0000 mg | Freq: Once | INTRAVENOUS | Status: AC
  Administered 2017-03-24: 1000 mg via INTRAVENOUS
  Filled 2017-03-23: qty 200

## 2017-03-23 MED ORDER — ACETAMINOPHEN 500 MG PO TABS
1000.0000 mg | ORAL_TABLET | Freq: Four times a day (QID) | ORAL | Status: AC
  Administered 2017-03-24 – 2017-03-28 (×18): 1000 mg via ORAL
  Filled 2017-03-23 (×19): qty 2

## 2017-03-23 MED ORDER — MIDAZOLAM HCL 2 MG/2ML IJ SOLN
INTRAMUSCULAR | Status: AC
  Filled 2017-03-23: qty 2

## 2017-03-23 MED ORDER — METOCLOPRAMIDE HCL 5 MG/ML IJ SOLN
10.0000 mg | Freq: Four times a day (QID) | INTRAMUSCULAR | Status: AC
  Administered 2017-03-23 – 2017-03-28 (×20): 10 mg via INTRAVENOUS
  Filled 2017-03-23 (×21): qty 2

## 2017-03-23 MED ORDER — ACETAMINOPHEN 160 MG/5ML PO SOLN
1000.0000 mg | Freq: Four times a day (QID) | ORAL | Status: AC
  Administered 2017-03-23: 1000 mg
  Filled 2017-03-23: qty 40.6

## 2017-03-23 MED ORDER — PROPOFOL 10 MG/ML IV BOLUS
INTRAVENOUS | Status: AC
  Filled 2017-03-23: qty 20

## 2017-03-23 MED ORDER — SODIUM CHLORIDE 0.9 % IJ SOLN
OROMUCOSAL | Status: DC | PRN
  Administered 2017-03-23 (×4): via TOPICAL

## 2017-03-23 MED ORDER — TRAMADOL HCL 50 MG PO TABS
50.0000 mg | ORAL_TABLET | ORAL | Status: DC | PRN
  Administered 2017-03-26 – 2017-03-30 (×6): 100 mg via ORAL
  Filled 2017-03-23 (×6): qty 2

## 2017-03-23 MED ORDER — PANTOPRAZOLE SODIUM 40 MG PO TBEC
40.0000 mg | DELAYED_RELEASE_TABLET | Freq: Every day | ORAL | Status: DC
  Administered 2017-03-25 – 2017-03-31 (×7): 40 mg via ORAL
  Filled 2017-03-23 (×7): qty 1

## 2017-03-23 MED ORDER — BISACODYL 10 MG RE SUPP: 10.0000 mg | Freq: Every day | RECTAL | Status: DC

## 2017-03-23 MED ORDER — OXYCODONE HCL 5 MG PO TABS
5.0000 mg | ORAL_TABLET | ORAL | Status: DC | PRN
  Administered 2017-03-24 – 2017-03-28 (×14): 10 mg via ORAL
  Filled 2017-03-23 (×14): qty 2

## 2017-03-23 MED ORDER — POTASSIUM CHLORIDE 10 MEQ/50ML IV SOLN
10.0000 meq | INTRAVENOUS | Status: AC
  Administered 2017-03-23 (×2): 10 meq via INTRAVENOUS

## 2017-03-23 MED ORDER — PROTAMINE SULFATE 10 MG/ML IV SOLN
INTRAVENOUS | Status: DC | PRN
  Administered 2017-03-23: 20 mg via INTRAVENOUS
  Administered 2017-03-23: 50 mg via INTRAVENOUS
  Administered 2017-03-23: 100 mg via INTRAVENOUS
  Administered 2017-03-23: 30 mg via INTRAVENOUS
  Administered 2017-03-23: 100 mg via INTRAVENOUS

## 2017-03-23 MED ORDER — PROPOFOL 10 MG/ML IV BOLUS
INTRAVENOUS | Status: DC | PRN
  Administered 2017-03-23: 50 mg via INTRAVENOUS

## 2017-03-23 MED ORDER — SODIUM CHLORIDE 0.9% FLUSH
3.0000 mL | Freq: Two times a day (BID) | INTRAVENOUS | Status: DC
  Administered 2017-03-24 – 2017-03-31 (×13): 3 mL via INTRAVENOUS

## 2017-03-23 MED ORDER — INSULIN REGULAR BOLUS VIA INFUSION
0.0000 [IU] | Freq: Three times a day (TID) | INTRAVENOUS | Status: DC
  Filled 2017-03-23: qty 10

## 2017-03-23 MED ORDER — ATORVASTATIN CALCIUM 80 MG PO TABS
80.0000 mg | ORAL_TABLET | Freq: Every day | ORAL | Status: DC
  Administered 2017-03-24 – 2017-03-30 (×8): 80 mg via ORAL
  Filled 2017-03-23 (×8): qty 1

## 2017-03-23 MED ORDER — FENTANYL CITRATE (PF) 100 MCG/2ML IJ SOLN
INTRAMUSCULAR | Status: DC | PRN
Start: 2017-03-23 — End: 2017-03-23
  Administered 2017-03-23: 100 ug via INTRAVENOUS
  Administered 2017-03-23: 300 ug via INTRAVENOUS
  Administered 2017-03-23: 150 ug via INTRAVENOUS
  Administered 2017-03-23: 100 ug via INTRAVENOUS
  Administered 2017-03-23: 150 ug via INTRAVENOUS
  Administered 2017-03-23 (×2): 100 ug via INTRAVENOUS
  Administered 2017-03-23: 250 ug via INTRAVENOUS

## 2017-03-23 MED ORDER — MILRINONE LACTATE IN DEXTROSE 20-5 MG/100ML-% IV SOLN
0.1250 ug/kg/min | INTRAVENOUS | Status: DC
  Administered 2017-03-24 – 2017-03-25 (×3): 0.3 ug/kg/min via INTRAVENOUS
  Administered 2017-03-26 – 2017-03-29 (×3): 0.125 ug/kg/min via INTRAVENOUS
  Filled 2017-03-23 (×6): qty 100

## 2017-03-23 MED ORDER — SODIUM CHLORIDE 0.9 % IV SOLN: 250.0000 mL | INTRAVENOUS | Status: DC

## 2017-03-23 MED ORDER — ALBUMIN HUMAN 5 % IV SOLN
INTRAVENOUS | Status: DC | PRN
  Administered 2017-03-23 (×2): via INTRAVENOUS

## 2017-03-23 MED FILL — Magnesium Sulfate Inj 50%: INTRAMUSCULAR | Qty: 10 | Status: AC

## 2017-03-23 MED FILL — Potassium Chloride Inj 2 mEq/ML: INTRAVENOUS | Qty: 10 | Status: AC

## 2017-03-23 MED FILL — Heparin Sodium (Porcine) Inj 1000 Unit/ML: INTRAMUSCULAR | Qty: 30 | Status: AC

## 2017-03-23 SURGICAL SUPPLY — 105 items
ADAPTER CARDIO PERF ANTE/RETRO (ADAPTER) ×5 IMPLANT
BAG DECANTER FOR FLEXI CONT (MISCELLANEOUS) ×5 IMPLANT
BANDAGE ACE 4X5 VEL STRL LF (GAUZE/BANDAGES/DRESSINGS) ×5 IMPLANT
BANDAGE ACE 6X5 VEL STRL LF (GAUZE/BANDAGES/DRESSINGS) ×5 IMPLANT
BASKET HEART  (ORDER IN 25'S) (MISCELLANEOUS) ×1
BASKET HEART (ORDER IN 25'S) (MISCELLANEOUS) ×1
BASKET HEART (ORDER IN 25S) (MISCELLANEOUS) ×3 IMPLANT
BLADE CLIPPER SURG (BLADE) IMPLANT
BLADE MINI RND TIP GREEN BEAV (BLADE) ×5 IMPLANT
BLADE STERNUM SYSTEM 6 (BLADE) ×5 IMPLANT
BLADE SURG 12 STRL SS (BLADE) ×5 IMPLANT
BNDG GAUZE ELAST 4 BULKY (GAUZE/BANDAGES/DRESSINGS) ×5 IMPLANT
CABLE PACING FASLOC BIEGE (MISCELLANEOUS) ×5 IMPLANT
CANISTER SUCT 3000ML PPV (MISCELLANEOUS) ×5 IMPLANT
CANNULA GUNDRY RCSP 15FR (MISCELLANEOUS) ×5 IMPLANT
CATH CPB KIT VANTRIGT (MISCELLANEOUS) ×5 IMPLANT
CATH ROBINSON RED A/P 18FR (CATHETERS) ×15 IMPLANT
CATH THORACIC 36FR RT ANG (CATHETERS) ×5 IMPLANT
CLIP FOGARTY SPRING 6M (CLIP) ×5 IMPLANT
CLIP TI WIDE RED SMALL 24 (CLIP) ×10 IMPLANT
CRADLE DONUT ADULT HEAD (MISCELLANEOUS) ×5 IMPLANT
DRAIN CHANNEL 32F RND 10.7 FF (WOUND CARE) ×5 IMPLANT
DRAPE CARDIOVASCULAR INCISE (DRAPES) ×2
DRAPE SLUSH/WARMER DISC (DRAPES) ×5 IMPLANT
DRAPE SRG 135X102X78XABS (DRAPES) ×3 IMPLANT
DRSG AQUACEL AG ADV 3.5X14 (GAUZE/BANDAGES/DRESSINGS) ×5 IMPLANT
ELECT BLADE 4.0 EZ CLEAN MEGAD (MISCELLANEOUS) ×5
ELECT BLADE 6.5 EXT (BLADE) ×5 IMPLANT
ELECT CAUTERY BLADE 6.4 (BLADE) ×5 IMPLANT
ELECT REM PT RETURN 9FT ADLT (ELECTROSURGICAL) ×10
ELECTRODE BLDE 4.0 EZ CLN MEGD (MISCELLANEOUS) ×3 IMPLANT
ELECTRODE REM PT RTRN 9FT ADLT (ELECTROSURGICAL) ×6 IMPLANT
FELT TEFLON 1X6 (MISCELLANEOUS) ×10 IMPLANT
GAUZE SPONGE 4X4 12PLY STRL LF (GAUZE/BANDAGES/DRESSINGS) ×10 IMPLANT
GLOVE BIO SURGEON STRL SZ 6 (GLOVE) ×10 IMPLANT
GLOVE BIO SURGEON STRL SZ 6.5 (GLOVE) ×12 IMPLANT
GLOVE BIO SURGEON STRL SZ7 (GLOVE) ×10 IMPLANT
GLOVE BIO SURGEON STRL SZ7.5 (GLOVE) ×25 IMPLANT
GLOVE BIO SURGEONS STRL SZ 6.5 (GLOVE) ×3
GLOVE BIOGEL M STER SZ 6 (GLOVE) ×5 IMPLANT
GLOVE BIOGEL PI IND STRL 7.0 (GLOVE) ×6 IMPLANT
GLOVE BIOGEL PI INDICATOR 7.0 (GLOVE) ×4
GOWN STRL REUS W/ TWL LRG LVL3 (GOWN DISPOSABLE) ×24 IMPLANT
GOWN STRL REUS W/TWL LRG LVL3 (GOWN DISPOSABLE) ×16
HEMOSTAT POWDER SURGIFOAM 1G (HEMOSTASIS) ×20 IMPLANT
HEMOSTAT SURGICEL 2X14 (HEMOSTASIS) ×5 IMPLANT
INSERT FOGARTY XLG (MISCELLANEOUS) IMPLANT
KIT BASIN OR (CUSTOM PROCEDURE TRAY) ×5 IMPLANT
KIT ROOM TURNOVER OR (KITS) ×5 IMPLANT
KIT SUCTION CATH 14FR (SUCTIONS) ×5 IMPLANT
KIT VASOVIEW HEMOPRO VH 3000 (KITS) ×5 IMPLANT
LEAD PACING MYOCARDI (MISCELLANEOUS) ×5 IMPLANT
MARKER GRAFT CORONARY BYPASS (MISCELLANEOUS) ×15 IMPLANT
NS IRRIG 1000ML POUR BTL (IV SOLUTION) ×35 IMPLANT
PACK OPEN HEART (CUSTOM PROCEDURE TRAY) ×5 IMPLANT
PAD ARMBOARD 7.5X6 YLW CONV (MISCELLANEOUS) ×10 IMPLANT
PAD ELECT DEFIB RADIOL ZOLL (MISCELLANEOUS) ×5 IMPLANT
PENCIL BUTTON HOLSTER BLD 10FT (ELECTRODE) ×5 IMPLANT
PUNCH AORTIC ROTATE 4.0MM (MISCELLANEOUS) IMPLANT
PUNCH AORTIC ROTATE 4.5MM 8IN (MISCELLANEOUS) ×5 IMPLANT
PUNCH AORTIC ROTATE 5MM 8IN (MISCELLANEOUS) IMPLANT
SET CARDIOPLEGIA MPS 5001102 (MISCELLANEOUS) ×5 IMPLANT
SPONGE LAP 18X18 X RAY DECT (DISPOSABLE) ×5 IMPLANT
SURGIFLO W/THROMBIN 8M KIT (HEMOSTASIS) ×5 IMPLANT
SUT BONE WAX W31G (SUTURE) ×5 IMPLANT
SUT ETHIBOND 2 0 SH (SUTURE) ×10
SUT ETHIBOND 2 0 SH 36X2 (SUTURE) ×15 IMPLANT
SUT MNCRL AB 4-0 PS2 18 (SUTURE) ×5 IMPLANT
SUT PROLENE 3 0 SH DA (SUTURE) IMPLANT
SUT PROLENE 3 0 SH1 36 (SUTURE) IMPLANT
SUT PROLENE 4 0 RB 1 (SUTURE) ×4
SUT PROLENE 4 0 SH DA (SUTURE) ×5 IMPLANT
SUT PROLENE 4-0 RB1 .5 CRCL 36 (SUTURE) ×6 IMPLANT
SUT PROLENE 5 0 C 1 36 (SUTURE) ×10 IMPLANT
SUT PROLENE 6 0 C 1 30 (SUTURE) ×30 IMPLANT
SUT PROLENE 6 0 CC (SUTURE) ×15 IMPLANT
SUT PROLENE 8 0 BV175 6 (SUTURE) ×15 IMPLANT
SUT PROLENE BLUE 7 0 (SUTURE) ×15 IMPLANT
SUT SILK  1 MH (SUTURE) ×6
SUT SILK 1 MH (SUTURE) ×9 IMPLANT
SUT SILK 1 TIES 10X30 (SUTURE) ×5 IMPLANT
SUT SILK 2 0 SH CR/8 (SUTURE) ×15 IMPLANT
SUT SILK 2 0 TIES 10X30 (SUTURE) ×5 IMPLANT
SUT SILK 2 0 TIES 17X18 (SUTURE) ×4
SUT SILK 2-0 18XBRD TIE BLK (SUTURE) ×6 IMPLANT
SUT SILK 3 0 SH CR/8 (SUTURE) ×5 IMPLANT
SUT SILK 4 0 TIE 10X30 (SUTURE) ×5 IMPLANT
SUT STEEL 6MS V (SUTURE) ×10 IMPLANT
SUT STEEL SZ 6 DBL 3X14 BALL (SUTURE) ×5 IMPLANT
SUT TEM PAC WIRE 2 0 SH (SUTURE) ×20 IMPLANT
SUT VIC AB 1 CTX 27 (SUTURE) ×25 IMPLANT
SUT VIC AB 1 CTX 36 (SUTURE) ×4
SUT VIC AB 1 CTX36XBRD ANBCTR (SUTURE) ×6 IMPLANT
SUT VIC AB 2-0 CT1 27 (SUTURE) ×2
SUT VIC AB 2-0 CT1 TAPERPNT 27 (SUTURE) ×3 IMPLANT
SUT VIC AB 2-0 CTX 27 (SUTURE) ×10 IMPLANT
SUT VIC AB 3-0 X1 27 (SUTURE) ×10 IMPLANT
SYSTEM SAHARA CHEST DRAIN ATS (WOUND CARE) ×5 IMPLANT
TAPE CLOTH SURG 4X10 WHT LF (GAUZE/BANDAGES/DRESSINGS) ×5 IMPLANT
TAPE PAPER 2X10 WHT MICROPORE (GAUZE/BANDAGES/DRESSINGS) ×5 IMPLANT
TOWEL GREEN STERILE FF (TOWEL DISPOSABLE) ×5 IMPLANT
TRAY FOLEY SILVER 16FR TEMP (SET/KITS/TRAYS/PACK) ×5 IMPLANT
TUBING INSUFFLATION (TUBING) ×5 IMPLANT
UNDERPAD 30X30 (UNDERPADS AND DIAPERS) ×5 IMPLANT
WATER STERILE IRR 1000ML POUR (IV SOLUTION) ×10 IMPLANT

## 2017-03-23 NOTE — Anesthesia Postprocedure Evaluation (Signed)
Anesthesia Post Note  Patient: George Griffin  Procedure(s) Performed: Procedure(s) (LRB): CORONARY ARTERY BYPASS GRAFTING (CABG) x , four using left internal mammary artery and right greater saphenous vein harvested endoscopically (N/A) TRANSESOPHAGEAL ECHOCARDIOGRAM (TEE) (N/A) ENDOVEIN HARVEST OF GREATER SAPHENOUS VEIN (Right)     Patient location during evaluation: SICU Anesthesia Type: General Level of consciousness: sedated Pain management: pain level controlled Vital Signs Assessment: post-procedure vital signs reviewed and stable Respiratory status: patient remains intubated per anesthesia plan Cardiovascular status: stable Anesthetic complications: no    Last Vitals:  Vitals:   03/23/17 2050 03/23/17 2115  BP:    Pulse: 99 (!) 113  Resp: 14 19  Temp:  36.6 C    Last Pain:  Vitals:   03/23/17 0541  TempSrc: Oral  PainSc:                  Jaycey Gens,W. EDMOND

## 2017-03-23 NOTE — Transfer of Care (Signed)
Immediate Anesthesia Transfer of Care Note  Patient: Peyten Weare  Procedure(s) Performed: Procedure(s): CORONARY ARTERY BYPASS GRAFTING (CABG) x , four using left internal mammary artery and right greater saphenous vein harvested endoscopically (N/A) TRANSESOPHAGEAL ECHOCARDIOGRAM (TEE) (N/A) ENDOVEIN HARVEST OF GREATER SAPHENOUS VEIN (Right)  Patient Location: SICU  Anesthesia Type:General  Level of Consciousness: sedated, unresponsive and Patient remains intubated per anesthesia plan  Airway & Oxygen Therapy: Patient remains intubated per anesthesia plan and Patient placed on Ventilator (see vital sign flow sheet for setting)  Post-op Assessment: Report given to RN and Post -op Vital signs reviewed and stable  Post vital signs: Reviewed and stable  Last Vitals:  Vitals:   03/23/17 1348 03/23/17 1349  BP:    Pulse: 90 92  Resp: 15 17  Temp:      Last Pain:  Vitals:   03/23/17 0541  TempSrc: Oral  PainSc:          Complications: No apparent anesthesia complications

## 2017-03-23 NOTE — Brief Op Note (Signed)
03/17/2017 - 03/23/2017  6:24 PM  PATIENT:  George Griffin  56 y.o. male  PRE-OPERATIVE DIAGNOSIS:  CAD  POST-OPERATIVE DIAGNOSIS:  CAD  PROCEDURE:  Procedure(s): CORONARY ARTERY BYPASS GRAFTING (CABG) x , using left internal mammary artery and right greater saphenous vein harvested endoscopically (N/A) LIMA to LAD SVG to PDA SVG to OM1 SVG to Diag 1  TRANSESOPHAGEAL ECHOCARDIOGRAM (TEE) (N/A) ENDOVEIN HARVEST OF GREATER SAPHENOUS VEIN (Right)  SURGEON:  Surgeon(s) and Role:    Ivin Poot, MD - Primary  PHYSICIAN ASSISTANT:  Nicholes Rough, PA-C   ANESTHESIA:   general  EBL:  Total I/O In: -  Out: 6168 [HFGBM:2111]  BLOOD ADMINISTERED:none  DRAINS: routine   LOCAL MEDICATIONS USED:  NONE  SPECIMEN:  No Specimen  DISPOSITION OF SPECIMEN:  N/A  COUNTS:  YES  TOURNIQUET:  * No tourniquets in log *  DICTATION: .Dragon Dictation  PLAN OF CARE: Admit to inpatient   PATIENT DISPOSITION:  ICU - intubated and hemodynamically stable.   Delay start of Pharmacological VTE agent (>24hrs) due to surgical blood loss or risk of bleeding: yes

## 2017-03-23 NOTE — OR Nursing (Signed)
2nd call to SICU charge 2010.

## 2017-03-23 NOTE — Anesthesia Procedure Notes (Signed)
Central Venous Catheter Insertion Performed by: Roberts Gaudy, anesthesiologist Start/End7/27/2018 1:35 PM, 03/23/2017 1:38 AM Patient location: Pre-op. Preanesthetic checklist: patient identified, IV checked, site marked, risks and benefits discussed, surgical consent, monitors and equipment checked, pre-op evaluation and timeout performed Position: supine Hand hygiene performed , maximum sterile barriers used  and Seldinger technique used Catheter size: 8 Fr Central line was placed.Double lumen Procedure performed using ultrasound guided technique. Ultrasound Notes:anatomy identified and image(s) printed for medical record Attempts: 1 Following insertion, line sutured, dressing applied and Biopatch. Post procedure assessment: blood return through all ports, free fluid flow and no air  Patient tolerated the procedure well with no immediate complications.

## 2017-03-23 NOTE — Anesthesia Procedure Notes (Signed)
Central Venous Catheter Insertion Performed by: Roberts Gaudy, anesthesiologist Start/End7/27/2018 1:36 PM, 03/23/2017 1:40 AM Patient location: Pre-op. Preanesthetic checklist: patient identified, IV checked, site marked, risks and benefits discussed, surgical consent, monitors and equipment checked, pre-op evaluation and timeout performed Position: supine Hand hygiene performed , maximum sterile barriers used  and Seldinger technique used PA cath was placed.Following insertion, line sutured, dressing applied and Biopatch.

## 2017-03-23 NOTE — Anesthesia Preprocedure Evaluation (Addendum)
Anesthesia Evaluation  Patient identified by MRN, date of birth, ID band Patient awake    Reviewed: Allergy & Precautions, NPO status , Patient's Chart, lab work & pertinent test results  Airway Mallampati: II  TM Distance: >3 FB Neck ROM: Full    Dental  (+) Teeth Intact, Dental Advisory Given,    Pulmonary former smoker,    breath sounds clear to auscultation       Cardiovascular hypertension,  Rhythm:Regular Rate:Normal     Neuro/Psych    GI/Hepatic   Endo/Other  diabetes  Renal/GU      Musculoskeletal   Abdominal   Peds  Hematology   Anesthesia Other Findings   Reproductive/Obstetrics                           Anesthesia Physical Anesthesia Plan  ASA: IV  Anesthesia Plan: General   Post-op Pain Management:    Induction: Intravenous  PONV Risk Score and Plan: Ondansetron  Airway Management Planned: Oral ETT  Additional Equipment: CVP, PA Cath, 3D TEE and Arterial line  Intra-op Plan:   Post-operative Plan: Post-operative intubation/ventilation  Informed Consent: I have reviewed the patients History and Physical, chart, labs and discussed the procedure including the risks, benefits and alternatives for the proposed anesthesia with the patient or authorized representative who has indicated his/her understanding and acceptance.   Dental advisory given  Plan Discussed with: CRNA and Anesthesiologist  Anesthesia Plan Comments:         Anesthesia Quick Evaluation

## 2017-03-23 NOTE — Anesthesia Procedure Notes (Signed)
Procedure Name: Intubation Date/Time: 03/23/2017 2:14 PM Performed by: Myna Bright Pre-anesthesia Checklist: Patient identified, Emergency Drugs available, Suction available and Patient being monitored Patient Re-evaluated:Patient Re-evaluated prior to induction Oxygen Delivery Method: Circle system utilized Preoxygenation: Pre-oxygenation with 100% oxygen Induction Type: IV induction Ventilation: Mask ventilation without difficulty Laryngoscope Size: Mac and 3 Grade View: Grade I Tube type: Oral Tube size: 8.0 mm Number of attempts: 1 Airway Equipment and Method: Stylet Placement Confirmation: ETT inserted through vocal cords under direct vision,  positive ETCO2 and breath sounds checked- equal and bilateral Secured at: 22 cm Tube secured with: Tape Dental Injury: Teeth and Oropharynx as per pre-operative assessment

## 2017-03-23 NOTE — OR Nursing (Signed)
1st call to SICU 1917.

## 2017-03-23 NOTE — Anesthesia Procedure Notes (Signed)
Central Venous Catheter Insertion Performed by: Roberts Gaudy, anesthesiologist Start/End7/27/2018 1:32 PM, 03/23/2017 1:36 AM Patient location: OR. Preanesthetic checklist: patient identified, IV checked, site marked, risks and benefits discussed, surgical consent, monitors and equipment checked, pre-op evaluation and timeout performed Position: supine Hand hygiene performed , maximum sterile barriers used  and Seldinger technique used Catheter size: 8.5 Fr Central line was placed.Sheath introducer Procedure performed using ultrasound guided technique. Ultrasound Notes:anatomy identified, needle tip was noted to be adjacent to the nerve/plexus identified, no ultrasound evidence of intravascular and/or intraneural injection and image(s) printed for medical record Attempts: 1 Following insertion, line sutured, dressing applied and Biopatch. Post procedure assessment: blood return through all ports, free fluid flow and no air  Patient tolerated the procedure well with no immediate complications.

## 2017-03-23 NOTE — Anesthesia Procedure Notes (Signed)
Arterial Line Insertion Start/End7/27/2018 11:26 AM Performed by: Micheline Rough, CRNA  Patient location: Pre-op. Preanesthetic checklist: patient identified, IV checked, risks and benefits discussed, monitors and equipment checked and pre-op evaluation Lidocaine 1% used for infiltration Left, radial was placed Catheter size: 20 G Hand hygiene performed  and maximum sterile barriers used  Allen's test indicative of satisfactory collateral circulation Attempts: 1 Procedure performed without using ultrasound guided technique. Following insertion, dressing applied and Biopatch. Post procedure assessment: normal  Patient tolerated the procedure well with no immediate complications.

## 2017-03-23 NOTE — Progress Notes (Signed)
The patient was examined and preop studies reviewed. There has been no change from the prior exam and the patient is ready for surgery.  plan CABG on [name]

## 2017-03-23 NOTE — Progress Notes (Signed)
ANTICOAGULATION CONSULT NOTE - Follow Up Consult  Pharmacy Consult for heparin Indication: chest pain/ACS  Allergies  Allergen Reactions  . No Known Allergies     Patient Measurements: Height: 5\' 3"  (160 cm) Weight: 161 lb 6 oz (73.2 kg) IBW/kg (Calculated) : 56.9 Heparin Dosing Weight: 72  Vital Signs: Temp: 98.6 F (37 C) (07/26 2234) Temp Source: Oral (07/26 2234) BP: 110/64 (07/26 2234) Pulse Rate: 99 (07/26 2234)  Labs:  Recent Labs  03/21/17 0248 03/22/17 0442 03/22/17 0755 03/22/17 1339 03/23/17 0347  HGB 14.1 14.1  --   --  13.4  HCT 41.0 41.7  --   --  39.7  PLT 190 201  --   --  181  LABPROT  --  14.1  --   --   --   INR  --  1.09  --   --   --   HEPARINUNFRC 0.62  --  0.35 0.49 0.74*  CREATININE 0.84 0.80  --   --  0.92    Estimated Creatinine Clearance: 80.4 mL/min (by C-G formula based on SCr of 0.92 mg/dL).   Medications:  Scheduled:  . aspirin EC  81 mg Oral Daily  . atorvastatin  80 mg Oral q1800  . carvedilol  3.125 mg Oral Once  . Chlorhexidine Gluconate Cloth  6 each Topical Daily  . feeding supplement (GLUCERNA SHAKE)  237 mL Oral TID BM  . heparin-papaverine-plasmalyte irrigation   Irrigation To OR  . insulin aspart  0-15 Units Subcutaneous TID WC  . insulin aspart  0-5 Units Subcutaneous QHS  . insulin aspart  8 Units Subcutaneous TID WC  . insulin detemir  30 Units Subcutaneous BID  . isosorbide mononitrate  30 mg Oral Daily  . magnesium sulfate  40 mEq Other To OR  . mouth rinse  15 mL Mouth Rinse BID  . mupirocin ointment  1 application Nasal BID  . potassium chloride  80 mEq Other To OR  . sodium chloride flush  3 mL Intravenous Q12H  . tranexamic acid  15 mg/kg Intravenous To OR  . tranexamic acid  2 mg/kg Intracatheter To OR    Assessment: 56yo male with NSTEMI, to CABG today.  Heparin level is above goal on current rate of 1400 units/hr.  Hg and pltc are wnl.  No bleeding noted per RN, pump setting is correct.  Goal of  Therapy:  Heparin level 0.3-0.7 units/ml Monitor platelets by anticoagulation protocol: Yes   Plan:  Decrease heparin to 1300 units/hr F/U after OR  Lewie Chamber., PharmD [facility]

## 2017-03-24 ENCOUNTER — Inpatient Hospital Stay (HOSPITAL_COMMUNITY): Payer: Medicare Other

## 2017-03-24 LAB — GLUCOSE, CAPILLARY
GLUCOSE-CAPILLARY: 148 mg/dL — AB (ref 65–99)
GLUCOSE-CAPILLARY: 108 mg/dL — AB (ref 65–99)
GLUCOSE-CAPILLARY: 139 mg/dL — AB (ref 65–99)
GLUCOSE-CAPILLARY: 145 mg/dL — AB (ref 65–99)
GLUCOSE-CAPILLARY: 148 mg/dL — AB (ref 65–99)
GLUCOSE-CAPILLARY: 154 mg/dL — AB (ref 65–99)
GLUCOSE-CAPILLARY: 167 mg/dL — AB (ref 65–99)
GLUCOSE-CAPILLARY: 171 mg/dL — AB (ref 65–99)
Glucose-Capillary: 122 mg/dL — ABNORMAL HIGH (ref 65–99)
Glucose-Capillary: 146 mg/dL — ABNORMAL HIGH (ref 65–99)
Glucose-Capillary: 152 mg/dL — ABNORMAL HIGH (ref 65–99)

## 2017-03-24 LAB — POCT I-STAT, CHEM 8
BUN: 15 mg/dL (ref 6–20)
CREATININE: 0.6 mg/dL — AB (ref 0.61–1.24)
Calcium, Ion: 1.07 mmol/L — ABNORMAL LOW (ref 1.15–1.40)
Chloride: 99 mmol/L — ABNORMAL LOW (ref 101–111)
Glucose, Bld: 195 mg/dL — ABNORMAL HIGH (ref 65–99)
HEMATOCRIT: 28 % — AB (ref 39.0–52.0)
HEMOGLOBIN: 9.5 g/dL — AB (ref 13.0–17.0)
POTASSIUM: 4.3 mmol/L (ref 3.5–5.1)
SODIUM: 136 mmol/L (ref 135–145)
TCO2: 22 mmol/L (ref 0–100)

## 2017-03-24 LAB — POCT I-STAT 3, ART BLOOD GAS (G3+)
Acid-base deficit: 2 mmol/L (ref 0.0–2.0)
Acid-base deficit: 1 mmol/L (ref 0.0–2.0)
BICARBONATE: 23.1 mmol/L (ref 20.0–28.0)
Bicarbonate: 22.7 mmol/L (ref 20.0–28.0)
O2 SAT: 99 %
O2 Saturation: 96 %
PCO2 ART: 35.8 mmHg (ref 32.0–48.0)
PH ART: 7.413 (ref 7.350–7.450)
PO2 ART: 82 mmHg — AB (ref 83.0–108.0)
Patient temperature: 37.5
Patient temperature: 37.6
TCO2: 24 mmol/L (ref 0–100)
TCO2: 24 mmol/L (ref 0–100)
pCO2 arterial: 37.9 mmHg (ref 32.0–48.0)
pH, Arterial: 7.395 (ref 7.350–7.450)
pO2, Arterial: 120 mmHg — ABNORMAL HIGH (ref 83.0–108.0)

## 2017-03-24 LAB — BASIC METABOLIC PANEL
ANION GAP: 6 (ref 5–15)
BUN: 12 mg/dL (ref 6–20)
CALCIUM: 7.4 mg/dL — AB (ref 8.9–10.3)
CO2: 23 mmol/L (ref 22–32)
CREATININE: 0.65 mg/dL (ref 0.61–1.24)
Chloride: 107 mmol/L (ref 101–111)
Glucose, Bld: 158 mg/dL — ABNORMAL HIGH (ref 65–99)
Potassium: 4.2 mmol/L (ref 3.5–5.1)
SODIUM: 136 mmol/L (ref 135–145)

## 2017-03-24 LAB — PREPARE FRESH FROZEN PLASMA
Unit division: 0
Unit division: 0

## 2017-03-24 LAB — CBC
HCT: 26.5 % — ABNORMAL LOW (ref 39.0–52.0)
HEMATOCRIT: 27.8 % — AB (ref 39.0–52.0)
Hemoglobin: 8.8 g/dL — ABNORMAL LOW (ref 13.0–17.0)
Hemoglobin: 9.3 g/dL — ABNORMAL LOW (ref 13.0–17.0)
MCH: 28.5 pg (ref 26.0–34.0)
MCH: 28.4 pg (ref 26.0–34.0)
MCHC: 33.2 g/dL (ref 30.0–36.0)
MCHC: 33.5 g/dL (ref 30.0–36.0)
MCV: 85.8 fL (ref 78.0–100.0)
MCV: 85 fL (ref 78.0–100.0)
PLATELETS: 129 10*3/uL — AB (ref 150–400)
PLATELETS: 126 10*3/uL — AB (ref 150–400)
RBC: 3.09 MIL/uL — ABNORMAL LOW (ref 4.22–5.81)
RBC: 3.27 MIL/uL — ABNORMAL LOW (ref 4.22–5.81)
RDW: 13.3 % (ref 11.5–15.5)
RDW: 13.3 % (ref 11.5–15.5)
WBC: 6.4 10*3/uL (ref 4.0–10.5)
WBC: 7.1 10*3/uL (ref 4.0–10.5)

## 2017-03-24 LAB — BPAM FFP
Blood Product Expiration Date: 201808012359
Blood Product Expiration Date: 201808012359
ISSUE DATE / TIME: 201807271831
ISSUE DATE / TIME: 201807271831
Unit Type and Rh: 7300
Unit Type and Rh: 7300

## 2017-03-24 LAB — ECHO TEE
LV dias vol index: 71 mL/m2
LV dias vol: 125 mL (ref 62–150)
LV sys vol index: 52 mL/m2
LV sys vol: 92 mL — AB
Simpson's disk: 27
Stroke v: 33 ml

## 2017-03-24 LAB — CREATININE, SERUM
Creatinine, Ser: 0.77 mg/dL (ref 0.61–1.24)
GFR calc non Af Amer: >60 mL/min (ref >60)

## 2017-03-24 LAB — PREPARE PLATELET PHERESIS: Unit division: 0

## 2017-03-24 LAB — MAGNESIUM
MAGNESIUM: 2.6 mg/dL — AB (ref 1.7–2.4)
MAGNESIUM: 2.2 mg/dL (ref 1.7–2.4)

## 2017-03-24 LAB — BPAM PLATELET PHERESIS
Blood Product Expiration Date: 201807272359
ISSUE DATE / TIME: 201807272004
UNIT TYPE AND RH: 6200

## 2017-03-24 MED ORDER — CHLORHEXIDINE GLUCONATE CLOTH 2 % EX PADS
6.0000 | MEDICATED_PAD | Freq: Every day | CUTANEOUS | Status: DC
  Administered 2017-03-26 – 2017-03-31 (×6): 6 via TOPICAL

## 2017-03-24 MED ORDER — INSULIN ASPART 100 UNIT/ML ~~LOC~~ SOLN
4.0000 [IU] | Freq: Three times a day (TID) | SUBCUTANEOUS | Status: DC
  Administered 2017-03-24 – 2017-03-31 (×13): 4 [IU] via SUBCUTANEOUS

## 2017-03-24 MED ORDER — INSULIN ASPART 100 UNIT/ML ~~LOC~~ SOLN
0.0000 [IU] | SUBCUTANEOUS | Status: DC
  Administered 2017-03-24 (×2): 2 [IU] via SUBCUTANEOUS
  Administered 2017-03-24 (×2): 4 [IU] via SUBCUTANEOUS
  Administered 2017-03-25: 8 [IU] via SUBCUTANEOUS
  Administered 2017-03-25 (×3): 4 [IU] via SUBCUTANEOUS
  Administered 2017-03-25 – 2017-03-26 (×3): 2 [IU] via SUBCUTANEOUS
  Administered 2017-03-26: 4 [IU] via SUBCUTANEOUS
  Administered 2017-03-26 (×2): 2 [IU] via SUBCUTANEOUS
  Administered 2017-03-26: 8 [IU] via SUBCUTANEOUS
  Administered 2017-03-27: 4 [IU] via SUBCUTANEOUS

## 2017-03-24 MED ORDER — SODIUM CHLORIDE 0.9% FLUSH: 10.0000 mL | INTRAVENOUS | Status: DC | PRN

## 2017-03-24 MED ORDER — INSULIN DETEMIR 100 UNIT/ML ~~LOC~~ SOLN
15.0000 [IU] | Freq: Two times a day (BID) | SUBCUTANEOUS | Status: DC
  Administered 2017-03-24 – 2017-03-25 (×3): 15 [IU] via SUBCUTANEOUS
  Filled 2017-03-24 (×4): qty 0.15

## 2017-03-24 MED ORDER — SODIUM CHLORIDE 0.9% FLUSH
10.0000 mL | Freq: Two times a day (BID) | INTRAVENOUS | Status: DC
  Administered 2017-03-24 – 2017-03-28 (×5): 10 mL

## 2017-03-24 MED ORDER — FUROSEMIDE 10 MG/ML IJ SOLN
20.0000 mg | Freq: Two times a day (BID) | INTRAMUSCULAR | Status: DC
  Administered 2017-03-24 – 2017-03-25 (×3): 20 mg via INTRAVENOUS
  Filled 2017-03-24 (×3): qty 2

## 2017-03-24 NOTE — Procedures (Signed)
Extubation Procedure Note  Patient Details:   Name: George Griffin DOB: [birth year] MRN: [id number]   Airway Documentation:     Evaluation  O2 sats: stable throughout Complications: No apparent complications Patient did tolerate procedure well. Bilateral Breath Sounds: Clear, Diminished   Yes   NIF -40 VC 0.8L IS 750. Pt extubated to 4L [place].  No complications at this time.     Clance Boll 03/24/2017, 1:38 AM

## 2017-03-24 NOTE — Progress Notes (Signed)
1 Day Post-Op Procedure(s) (LRB): CORONARY ARTERY BYPASS GRAFTING (CABG) x , four using left internal mammary artery and right greater saphenous vein harvested endoscopically (N/A) TRANSESOPHAGEAL ECHOCARDIOGRAM (TEE) (N/A) ENDOVEIN HARVEST OF GREATER SAPHENOUS VEIN (Right) Subjective: cabgx4  Ischemic CM Hx stage Griffin lung cancer Doing well on milrinone Objective: Vital signs in last 24 hours: Temp:  [97.9 F (36.6 C)-99.7 F (37.6 C)] 98.8 F (37.1 C) (07/28 0700) Pulse Rate:  [88-113] 109 (07/28 0700) Cardiac Rhythm: Sinus tachycardia (07/27 2115) Resp:  [13-25] 16 (07/28 0700) BP: (66-121)/(49-76) 105/64 (07/28 0700) SpO2:  [94 %-100 %] 100 % (07/28 0700) Arterial Line BP: (83-171)/(43-67) 128/52 (07/28 0700) FiO2 (%):  [40 %-50 %] 40 % (07/28 0100) Weight:  [161 lb 6 oz (73.2 kg)-172 lb 9.9 oz (78.3 kg)] 172 lb 9.9 oz (78.3 kg) (07/28 0600)  Hemodynamic parameters for last 24 hours: PAP: (16-39)/(4-23) 30/14 CO:  [3.8 L/min-5.4 L/min] 4.7 L/min CI:  [2.2 L/min/m2-3 L/min/m2] 2.6 L/min/m2  Intake/Output from previous day: 07/27 0701 - 07/28 0700 In: 6150.5 [P.O.:60; I.V.:3398.5; Blood:1122; NG/GT:70; IV Piggyback:1500] Out: 3300 [Urine:3540; Emesis/NG output:70; Blood:1150; Chest Tube:360] Intake/Output this shift: Total I/O In: -  Out: 140 [Urine:100; Chest Tube:40]       Exam    General- alert and comfortable   Lungs- clear without rales, wheezes   Cor- regular rate and rhythm, no murmur , gallop   Abdomen- soft, non-tender   Extremities - warm, non-tender, minimal edema   Neuro- oriented, appropriate, no focal weakness   Lab Results:  Recent Labs  03/23/17 2055 03/24/17 0338  WBC 6.3 6.4  HGB 9.1* 8.8*  HCT 27.8* 26.5*  PLT 106* 129*   BMET:  Recent Labs  03/23/17 0347  03/23/17 1928 03/24/17 0338  NA 135  < > 140 136  K 4.6  < > 4.2 4.2  CL 101  < > 101 107  CO2 26  --   --  23  GLUCOSE 220*  < > 166* 158*  BUN 22*  < > 14 12  CREATININE  0.92  < > 0.40* 0.65  CALCIUM 9.2  --   --  7.4*  < > = values in this interval not displayed.  PT/INR:  Recent Labs  03/23/17 2055  LABPROT 17.8*  INR 1.45   ABG    Component Value Date/Time   PHART 7.395 03/24/2017 0234   HCO3 23.1 03/24/2017 0234   TCO2 24 03/24/2017 0234   ACIDBASEDEF 1.0 03/24/2017 0234   O2SAT 96.0 03/24/2017 0234   CBG (last 3)   Recent Labs  03/24/17 0350 03/24/17 0448 03/24/17 0551  GLUCAP 152* 139* 122*    Assessment/Plan: S/P Procedure(s) (LRB): CORONARY ARTERY BYPASS GRAFTING (CABG) x , four using left internal mammary artery and right greater saphenous vein harvested endoscopically (N/A) TRANSESOPHAGEAL ECHOCARDIOGRAM (TEE) (N/A) ENDOVEIN HARVEST OF GREATER SAPHENOUS VEIN (Right) Mobilize Diuresis Diabetes control See progression orders cont milrinone   LOS: 7 days    George Griffin 03/24/2017

## 2017-03-24 NOTE — Progress Notes (Signed)
Pt admitted from O.R. s/p CABG. Pt attached to unit monitor and vent, report received from Alcide Goodness, CRNA. Labs, EKG and PCXR completed. No family available per CRNA, Dr Prescott Gum at beside, updated on Pt status. Will continue to monitor and assess for Rapid weaning protocol.

## 2017-03-24 NOTE — Progress Notes (Signed)
CT surgery p.m. Rounds  Sitting up in chair Sinus rhythm Anterior mediastinal tube out Diuresing adequately Blood sugars under control Continue milrinone for ischemic cardiomyopathy

## 2017-03-25 ENCOUNTER — Inpatient Hospital Stay (HOSPITAL_COMMUNITY): Payer: Medicare Other

## 2017-03-25 LAB — CBC
HCT: 27.1 % — ABNORMAL LOW (ref 39.0–52.0)
Hemoglobin: 9 g/dL — ABNORMAL LOW (ref 13.0–17.0)
MCH: 28.4 pg (ref 26.0–34.0)
MCHC: 33.2 g/dL (ref 30.0–36.0)
MCV: 85.5 fL (ref 78.0–100.0)
Platelets: 140 10*3/uL — ABNORMAL LOW (ref 150–400)
RBC: 3.17 MIL/uL — ABNORMAL LOW (ref 4.22–5.81)
RDW: 13.3 % (ref 11.5–15.5)
WBC: 8 10*3/uL (ref 4.0–10.5)

## 2017-03-25 LAB — GLUCOSE, CAPILLARY
GLUCOSE-CAPILLARY: 141 mg/dL — AB (ref 65–99)
GLUCOSE-CAPILLARY: 197 mg/dL — AB (ref 65–99)
GLUCOSE-CAPILLARY: 227 mg/dL — AB (ref 65–99)
GLUCOSE-CAPILLARY: 199 mg/dL — AB (ref 65–99)
Glucose-Capillary: 151 mg/dL — ABNORMAL HIGH (ref 65–99)

## 2017-03-25 LAB — POCT I-STAT, CHEM 8
BUN: 23 mg/dL — ABNORMAL HIGH (ref 6–20)
Calcium, Ion: 1.04 mmol/L — ABNORMAL LOW (ref 1.15–1.40)
Chloride: 95 mmol/L — ABNORMAL LOW (ref 101–111)
Creatinine, Ser: 0.8 mg/dL (ref 0.61–1.24)
GLUCOSE: 228 mg/dL — AB (ref 65–99)
HEMATOCRIT: 28 % — AB (ref 39.0–52.0)
HEMOGLOBIN: 9.5 g/dL — AB (ref 13.0–17.0)
POTASSIUM: 4.4 mmol/L (ref 3.5–5.1)
Sodium: 132 mmol/L — ABNORMAL LOW (ref 135–145)
TCO2: 24 mmol/L (ref 0–100)

## 2017-03-25 LAB — BASIC METABOLIC PANEL
Anion gap: 7 (ref 5–15)
BUN: 18 mg/dL (ref 6–20)
CO2: 24 mmol/L (ref 22–32)
Calcium: 7.9 mg/dL — ABNORMAL LOW (ref 8.9–10.3)
Chloride: 102 mmol/L (ref 101–111)
Creatinine, Ser: 0.83 mg/dL (ref 0.61–1.24)
GFR calc Af Amer: >60 mL/min (ref >60)
GFR calc non Af Amer: >60 mL/min (ref >60)
Glucose, Bld: 145 mg/dL — ABNORMAL HIGH (ref 65–99)
Potassium: 4 mmol/L (ref 3.5–5.1)
Sodium: 133 mmol/L — ABNORMAL LOW (ref 135–145)

## 2017-03-25 MED ORDER — LEVALBUTEROL HCL 1.25 MG/0.5ML IN NEBU
1.2500 mg | INHALATION_SOLUTION | Freq: Four times a day (QID) | RESPIRATORY_TRACT | Status: DC
  Administered 2017-03-25 – 2017-03-26 (×3): 1.25 mg via RESPIRATORY_TRACT
  Filled 2017-03-25 (×4): qty 0.5

## 2017-03-25 MED ORDER — FUROSEMIDE 10 MG/ML IJ SOLN
INTRAMUSCULAR | Status: AC
Start: 2017-03-25 — End: 2017-03-25
  Administered 2017-03-25: 20 mg
  Filled 2017-03-25: qty 2

## 2017-03-25 MED ORDER — AMIODARONE HCL IN DEXTROSE 360-4.14 MG/200ML-% IV SOLN
60.0000 mg/h | INTRAVENOUS | Status: AC
  Administered 2017-03-25: 60 mg/h via INTRAVENOUS
  Filled 2017-03-25 (×2): qty 200

## 2017-03-25 MED ORDER — AMIODARONE HCL IN DEXTROSE 360-4.14 MG/200ML-% IV SOLN
30.0000 mg/h | INTRAVENOUS | Status: AC
  Administered 2017-03-25 – 2017-03-26 (×2): 30 mg/h via INTRAVENOUS
  Filled 2017-03-25: qty 200

## 2017-03-25 MED ORDER — FUROSEMIDE 10 MG/ML IJ SOLN
40.0000 mg | Freq: Two times a day (BID) | INTRAMUSCULAR | Status: DC
  Administered 2017-03-25: 40 mg via INTRAVENOUS
  Administered 2017-03-25: 20 mg via INTRAVENOUS
  Administered 2017-03-26 – 2017-03-27 (×3): 40 mg via INTRAVENOUS
  Filled 2017-03-25 (×5): qty 4

## 2017-03-25 MED ORDER — AMIODARONE HCL 200 MG PO TABS
400.0000 mg | ORAL_TABLET | Freq: Two times a day (BID) | ORAL | Status: DC
  Administered 2017-03-26 (×2): 400 mg via ORAL
  Filled 2017-03-25 (×3): qty 2

## 2017-03-25 MED ORDER — DIGOXIN 125 MCG PO TABS
0.2500 mg | ORAL_TABLET | Freq: Every day | ORAL | Status: DC
  Administered 2017-03-25 – 2017-03-31 (×7): 0.25 mg via ORAL
  Filled 2017-03-25 (×7): qty 2

## 2017-03-25 MED ORDER — INSULIN DETEMIR 100 UNIT/ML ~~LOC~~ SOLN
20.0000 [IU] | Freq: Two times a day (BID) | SUBCUTANEOUS | Status: DC
  Administered 2017-03-25 – 2017-03-27 (×5): 20 [IU] via SUBCUTANEOUS
  Filled 2017-03-25 (×6): qty 0.2

## 2017-03-25 MED ORDER — FENTANYL CITRATE (PF) 100 MCG/2ML IJ SOLN
50.0000 ug | INTRAMUSCULAR | Status: DC | PRN
  Administered 2017-03-25 – 2017-03-26 (×2): 50 ug via INTRAVENOUS
  Filled 2017-03-25 (×2): qty 2

## 2017-03-25 NOTE — Progress Notes (Signed)
2 Days Post-Op Procedure(s) (LRB): CORONARY ARTERY BYPASS GRAFTING (CABG) x , four using left internal mammary artery and right greater saphenous vein harvested endoscopically (N/A) TRANSESOPHAGEAL ECHOCARDIOGRAM (TEE) (N/A) ENDOVEIN HARVEST OF GREATER SAPHENOUS VEIN (Right) Subjective: better pain control with fentanyl Walked in hall sinus tach/ wheezing - cannot inc lopressor Cont lasix, add xopenex Objective: Vital signs in last 24 hours: Temp:  [98.7 F (37.1 C)-100 F (37.8 C)] 100 F (37.8 C) (07/29 1600) Pulse Rate:  [108-123] 109 (07/29 1800) Cardiac Rhythm: Sinus tachycardia (07/29 1600) Resp:  [11-35] 19 (07/29 1800) BP: (101-138)/(64-86) 105/64 (07/29 1800) SpO2:  [91 %-100 %] 95 % (07/29 1800) Arterial Line BP: (125-163)/(53-64) 127/53 (07/29 0100) Weight:  [170 lb 3.1 oz (77.2 kg)] 170 lb 3.1 oz (77.2 kg) (07/29 0200)  Hemodynamic parameters for last 24 hours:    Intake/Output from previous day: 07/28 0701 - 07/29 0700 In: 1735.7 [P.O.:1010; I.V.:625.7; IV Piggyback:100] Out: 2055 [Urine:1815; Chest Tube:240] Intake/Output this shift: Total I/O In: 954.5 [P.O.:600; I.V.:354.5] Out: 1120 [Urine:1030; Chest Tube:90]       Exam    General- alert and comfortable   Lungs- clear without rales, wheezes   Cor- regular rate and rhythm, no murmur , gallop   Abdomen- soft, non-tender   Extremities - warm, non-tender, minimal edema   Neuro- oriented, appropriate, no focal weakness   Lab Results:  Recent Labs  03/24/17 1617  03/25/17 0336 03/25/17 1620  WBC 7.1  --  8.0  --   HGB 9.3*  < > 9.0* 9.5*  HCT 27.8*  < > 27.1* 28.0*  PLT 126*  --  140*  --   < > = values in this interval not displayed. BMET:  Recent Labs  03/24/17 0338  03/25/17 0336 03/25/17 1620  NA 136  < > 133* 132*  K 4.2  < > 4.0 4.4  CL 107  < > 102 95*  CO2 23  --  24  --   GLUCOSE 158*  < > 145* 228*  BUN 12  < > 18 23*  CREATININE 0.65  < > 0.83 0.80  CALCIUM 7.4*  --  7.9*   --   < > = values in this interval not displayed.  PT/INR:  Recent Labs  03/23/17 2055  LABPROT 17.8*  INR 1.45   ABG    Component Value Date/Time   PHART 7.395 03/24/2017 0234   HCO3 23.1 03/24/2017 0234   TCO2 24 03/25/2017 1620   ACIDBASEDEF 1.0 03/24/2017 0234   O2SAT 96.0 03/24/2017 0234   CBG (last 3)   Recent Labs  03/25/17 0349 03/25/17 0739 03/25/17 1159  GLUCAP 141* 151* 197*    Assessment/Plan: S/P Procedure(s) (LRB): CORONARY ARTERY BYPASS GRAFTING (CABG) x , four using left internal mammary artery and right greater saphenous vein harvested endoscopically (N/A) TRANSESOPHAGEAL ECHOCARDIOGRAM (TEE) (N/A) ENDOVEIN HARVEST OF GREATER SAPHENOUS VEIN (Right) Mobilize Diuresis Diabetes control oral amio in am   LOS: 8 days    George Griffin 03/25/2017

## 2017-03-25 NOTE — Progress Notes (Signed)
Thoroughly discussed with pt plan of care via [place] video interpreter.  Discussed purpose and process of progression with ambulating.  Spoke with pt about calling for pain at level of 4-5.  Importance of calling before pain gets out of control.  Discussed purpose of new rx of iv amiodarone and po digoxin.  Again, all discussed through video interpreter.  Pt verbalizes understanding and states no further questions at this time.  Pt's wife currently in [place] due to death in family and will arrive back to [place] in approximately 1 week.  Pt's cell phone at bedside and is communicating with friends.

## 2017-03-25 NOTE — Plan of Care (Signed)
Problem: Cardiac: Goal: Hemodynamic stability will improve Outcome: Progressing Pt with adequate BP off vasopressors, just needing ordered Milrinone for ICM. Heart rate remains tachycardic 100-120's.  Problem: Respiratory: Goal: Levels of oxygenation will improve Outcome: Progressing Pt able to demonstrate good oxygenation and pulmonary toilet post extubation. O2 sats on room air > 92 %.

## 2017-03-26 ENCOUNTER — Encounter (HOSPITAL_COMMUNITY): Payer: Self-pay | Admitting: Cardiothoracic Surgery

## 2017-03-26 ENCOUNTER — Inpatient Hospital Stay (HOSPITAL_COMMUNITY): Payer: Medicare Other

## 2017-03-26 LAB — GLUCOSE, CAPILLARY
GLUCOSE-CAPILLARY: 120 mg/dL — AB (ref 65–99)
GLUCOSE-CAPILLARY: 126 mg/dL — AB (ref 65–99)
GLUCOSE-CAPILLARY: 130 mg/dL — AB (ref 65–99)
GLUCOSE-CAPILLARY: 131 mg/dL — AB (ref 65–99)
GLUCOSE-CAPILLARY: 188 mg/dL — AB (ref 65–99)
Glucose-Capillary: 141 mg/dL — ABNORMAL HIGH (ref 65–99)
Glucose-Capillary: 201 mg/dL — ABNORMAL HIGH (ref 65–99)
Glucose-Capillary: 115 mg/dL — ABNORMAL HIGH (ref 65–99)

## 2017-03-26 LAB — POCT I-STAT 4, (NA,K, GLUC, HGB,HCT)
Glucose, Bld: 171 mg/dL — ABNORMAL HIGH (ref 65–99)
HCT: 27 % — ABNORMAL LOW (ref 39.0–52.0)
Hemoglobin: 9.2 g/dL — ABNORMAL LOW (ref 13.0–17.0)
Potassium: 3.8 mmol/L (ref 3.5–5.1)
Sodium: 141 mmol/L (ref 135–145)

## 2017-03-26 LAB — TYPE AND SCREEN
ABO/RH(D): B POS
Antibody Screen: NEGATIVE
Unit division: 0
Unit division: 0

## 2017-03-26 LAB — BPAM RBC
Blood Product Expiration Date: 201808162359
Blood Product Expiration Date: 201808172359
Unit Type and Rh: 7300
Unit Type and Rh: 7300

## 2017-03-26 LAB — BASIC METABOLIC PANEL
Anion gap: 7 (ref 5–15)
BUN: 17 mg/dL (ref 6–20)
CO2: 27 mmol/L (ref 22–32)
Calcium: 7.6 mg/dL — ABNORMAL LOW (ref 8.9–10.3)
Chloride: 98 mmol/L — ABNORMAL LOW (ref 101–111)
Creatinine, Ser: 0.78 mg/dL (ref 0.61–1.24)
GFR calc Af Amer: >60 mL/min (ref >60)
GFR calc non Af Amer: >60 mL/min (ref >60)
Glucose, Bld: 147 mg/dL — ABNORMAL HIGH (ref 65–99)
Potassium: 3.7 mmol/L (ref 3.5–5.1)
Sodium: 132 mmol/L — ABNORMAL LOW (ref 135–145)

## 2017-03-26 LAB — POCT I-STAT 3, ART BLOOD GAS (G3+)
BICARBONATE: 25.3 mmol/L (ref 20.0–28.0)
O2 SAT: 96 %
TCO2: 27 mmol/L (ref 0–100)
pCO2 arterial: 42.7 mmHg (ref 32.0–48.0)
pH, Arterial: 7.377 (ref 7.350–7.450)
pO2, Arterial: 80 mmHg — ABNORMAL LOW (ref 83.0–108.0)

## 2017-03-26 LAB — CBC
HCT: 24.9 % — ABNORMAL LOW (ref 39.0–52.0)
Hemoglobin: 8.2 g/dL — ABNORMAL LOW (ref 13.0–17.0)
MCH: 28 pg (ref 26.0–34.0)
MCHC: 32.9 g/dL (ref 30.0–36.0)
MCV: 85 fL (ref 78.0–100.0)
Platelets: 149 10*3/uL — ABNORMAL LOW (ref 150–400)
RBC: 2.93 MIL/uL — ABNORMAL LOW (ref 4.22–5.81)
RDW: 13.3 % (ref 11.5–15.5)
WBC: 8.2 10*3/uL (ref 4.0–10.5)

## 2017-03-26 MED ORDER — POTASSIUM CHLORIDE 10 MEQ/50ML IV SOLN
10.0000 meq | INTRAVENOUS | Status: AC
  Administered 2017-03-26 (×3): 10 meq via INTRAVENOUS
  Filled 2017-03-26 (×2): qty 50

## 2017-03-26 MED ORDER — GLUCERNA SHAKE PO LIQD
237.0000 mL | Freq: Two times a day (BID) | ORAL | Status: DC
  Administered 2017-03-27 – 2017-03-31 (×7): 237 mL via ORAL

## 2017-03-26 MED FILL — Sodium Bicarbonate IV Soln 8.4%: INTRAVENOUS | Qty: 50 | Status: AC

## 2017-03-26 MED FILL — Lidocaine HCl IV Inj 20 MG/ML: INTRAVENOUS | Qty: 5 | Status: AC

## 2017-03-26 MED FILL — Electrolyte-R (PH 7.4) Solution: INTRAVENOUS | Qty: 5000 | Status: AC

## 2017-03-26 MED FILL — Sodium Chloride IV Soln 0.9%: INTRAVENOUS | Qty: 2000 | Status: AC

## 2017-03-26 MED FILL — Mannitol IV Soln 20%: INTRAVENOUS | Qty: 500 | Status: AC

## 2017-03-26 MED FILL — Heparin Sodium (Porcine) Inj 1000 Unit/ML: INTRAMUSCULAR | Qty: 10 | Status: AC

## 2017-03-26 NOTE — Progress Notes (Signed)
Nutrition Follow Up  DOCUMENTATION CODES:   Not applicable  INTERVENTION:    Glucerna Shake po BID, each supplement provides 220 kcal and 10 grams of protein  NUTRITION DIAGNOSIS:   Increased nutrient needs related to chronic illness (CAD) as evidenced by estimated needs, ongoing   GOAL:   Patient will meet greater than or equal to 90% of their needs, progressing  MONITOR:   PO intake, Supplement acceptance, Labs, Weight trends, Skin, I & O's  ASSESSMENT:   56 year old male with past history of non-insulin-dependent diabetes, hyperlipidemia, hypertension, lung CA who presented with ongoing dyspnea and chest pain.  Pt s/p procedure 7/27: CORONARY ARTERY BYPASS GRAFTING (CABG)  Extubated 7/28. Currently on a Heart Healthy/Carbohydrate Modified diet. PO intake variable at 0-100% per flowsheet records. Labs reviewed. Na 132 (L). CBG's [phone]. Medications reviewed and include Reglan.  Diet Order:  Diet heart healthy/carb modified Room service appropriate? Yes; Fluid consistency: Thin  Skin:  Reviewed, no issues  Last BM:  7/26  Height:   Ht Readings from Last 1 Encounters:  03/23/17 5\' 7"  (1.702 m)   Weight:   Wt Readings from Last 1 Encounters:  03/25/17 170 lb 3.1 oz (77.2 kg)   Ideal Body Weight:  56.4 kg  BMI:  Body mass index is 26.66 kg/m.  Estimated Nutritional Needs:   Kcal:  1650-1850  Protein:  85-100 grams  Fluid:  1.6-1.8 L  EDUCATION NEEDS:   No education needs identified at this time  Arthur Holms, RD, LDN Pager #: [phone] After-Hours Pager #: [phone]

## 2017-03-26 NOTE — Progress Notes (Signed)
3 Days Post-Op Procedure(s) (LRB): CORONARY ARTERY BYPASS GRAFTING (CABG) x , four using left internal mammary artery and right greater saphenous vein harvested endoscopically (N/A) TRANSESOPHAGEAL ECHOCARDIOGRAM (TEE) (N/A) ENDOVEIN HARVEST OF GREATER SAPHENOUS VEIN (Right) Subjective: Continues to progress after CABG for ischemic cardiomyopathy History of treated stage III lung cancer Remaining chest tube removed Patient back in sinus rhythm, ambulating in hallway Weaning milrinone for ischemic cardiomyopathy  Objective: Vital signs in last 24 hours: Temp:  [98 F (36.7 C)-99.2 F (37.3 C)] 99.2 F (37.3 C) (07/30 1611) Pulse Rate:  [96-110] 96 (07/30 1600) Cardiac Rhythm: Normal sinus rhythm (07/30 1200) Resp:  [12-24] 13 (07/30 1600) BP: (91-153)/(56-86) 116/76 (07/30 1600) SpO2:  [93 %-100 %] 94 % (07/30 1600)  Hemodynamic parameters for last 24 hours:  stable  Intake/Output from previous day: 07/29 0701 - 07/30 0700 In: 2674.2 [P.O.:1680; I.V.:844.2; IV Piggyback:150] Out: 2175 [Urine:1945; Chest Tube:230] Intake/Output this shift: Total I/O In: 528.5 [P.O.:360; I.V.:118.5; IV Piggyback:50] Out: 900 [Urine:850; Chest Tube:50]       Exam    General- alert and comfortable   Lungs- clear without rales, wheezes   Cor- regular rate and rhythm, no murmur , gallop   Abdomen- soft, non-tender   Extremities - warm, non-tender, minimal edema   Neuro- oriented, appropriate, no focal weakness   Lab Results:  Recent Labs  03/25/17 0336 03/25/17 1620 03/26/17 0339  WBC 8.0  --  8.2  HGB 9.0* 9.5* 8.2*  HCT 27.1* 28.0* 24.9*  PLT 140*  --  149*   BMET:  Recent Labs  03/25/17 0336 03/25/17 1620 03/26/17 0339  NA 133* 132* 132*  K 4.0 4.4 3.7  CL 102 95* 98*  CO2 24  --  27  GLUCOSE 145* 228* 147*  BUN 18 23* 17  CREATININE 0.83 0.80 0.78  CALCIUM 7.9*  --  7.6*    PT/INR:  Recent Labs  03/23/17 2055  LABPROT 17.8*  INR 1.45   ABG    Component  Value Date/Time   PHART 7.395 03/24/2017 0234   HCO3 23.1 03/24/2017 0234   TCO2 24 03/25/2017 1620   ACIDBASEDEF 1.0 03/24/2017 0234   O2SAT 96.0 03/24/2017 0234   CBG (last 3)   Recent Labs  03/26/17 0803 03/26/17 1218 03/26/17 1609  GLUCAP 188* 201* 115*    Assessment/Plan: S/P Procedure(s) (LRB): CORONARY ARTERY BYPASS GRAFTING (CABG) x , four using left internal mammary artery and right greater saphenous vein harvested endoscopically (N/A) TRANSESOPHAGEAL ECHOCARDIOGRAM (TEE) (N/A) ENDOVEIN HARVEST OF GREATER SAPHENOUS VEIN (Right) Mobilize Wean milrinone and transfer to stepdown tomorrow   LOS: 9 days    Tharon Aquas Trigt III 03/26/2017

## 2017-03-26 NOTE — Plan of Care (Signed)
Problem: Activity: Goal: Risk for activity intolerance will decrease Outcome: Progressing Pt tolerating increase in activity, ambulating three times per day. Sl. exersional expiratory wheezes noted without drop in O2 sats. Pt recovers quickly, MD aware.  Problem: Pain Management: Goal: Pain level will decrease Outcome: Progressing Pt pain tolerance improved after MD changed PRN IV analgesic to Fentanyl. Pt notifying staff of need for pain meds better after interpreter service was involved and explained pain scale in native language.

## 2017-03-26 NOTE — Progress Notes (Signed)
      [address]       [name] [address]             [phone]      Evening rounds  Asleep at present  BP 116/76   Pulse 96   Temp 99.2 F (37.3 C) (Oral)   Resp 13   Ht 5\' 7"  (1.702 m)   Wt 170 lb 3.1 oz (77.2 kg)   SpO2 94%   BMI 26.66 kg/m    Intake/Output Summary (Last 24 hours) at 03/26/17 1757 Last data filed at 03/26/17 1600  Gross per 24 hour  Intake          2215.92 ml  Output             1955 ml  Net           260.92 ml   Continue current care ' Steven C. Roxan Hockey, MD [facility] [phone]

## 2017-03-26 NOTE — Op Note (Signed)
NAME:  JOHANSON, Cloy                          ACCOUNT NO.:  MEDICAL RECORD NO.:  [id number]  LOCATION:                                 FACILITY:  PHYSICIAN:  Ivin Poot, M.D.  DATE OF BIRTH:  [birth year]  DATE OF PROCEDURE:  03/23/2017 DATE OF DISCHARGE:                              OPERATIVE REPORT   PREOPERATIVE DIAGNOSES: 1. Ischemic cardiomyopathy with ejection fraction of 25%. 2. 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 3. History of stage III lung cancer of the right hilum, treated with     radiation and chemotherapy. 4. Non-ST elevation myocardial infarction with unstable angina.  POSTOPERATIVE DIAGNOSES: 1. Ischemic cardiomyopathy with ejection fraction of 25%. 2. 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 3. History of stage III lung cancer of the right hilum, treated with     radiation and chemotherapy. 4. Non-ST elevation myocardial infarction with unstable angina.  OPERATION:  Coronary artery bypass grafting x4 (left internal mammary artery to left anterior descending artery, saphenous vein graft to diagonal, saphenous vein graft to obtuse marginal, saphenous vein graft to posterior descending).  SURGEON:  Ivin Poot, M.D.  ASSISTANT:  Nicholes Rough, PA-C.  ANESTHESIA:  General by Dr. Oren Bracket.  CLINICAL NOTE:  The patient is a 56 year old [place] male, diabetic who presented with chest pain and symptoms of heart failure.  He was found to have ejection fraction of 25% by echo without significant valvular disease and was found to have severe 3-vessel coronary artery disease in a diabetic pattern with high-grade 90% stenosis of the LAD, right coronary, and circumflex marginal.  He was evaluated with repeat chest and abdominal scans and there was no evidence of recurrent lung cancer.  He underwent right heart catheterization which showed low cardiac output of 1.8 L/minute per sq m cardiac index, but with  only mildly elevated filling pressures.  He was felt to be a candidate for high risk of surgical coronary revascularization, which I discussed in detail with the patient as well as using an interpreter.  He understood the benefits of improved survival and improved symptoms of his heart failure and angina.  He understood the risks of death, stroke, bleeding, infection, pulmonary problems including pleural effusion, and death and infection.  He agreed to proceed with surgery under what I felt was an informed consent.  OPERATIVE FINDINGS: 1. Adequate conduit, but vein was slightly enlarged and the mammary     artery was small. 2. Difficult targets, but adequate for grafting. 3. No packed cell transfusion required for the surgery. 4. Post pump coagulopathy requiring FFP and platelets to improve     coagulation function.  OPERATIVE PROCEDURE:  The patient was brought to the operating room and placed supine on the operating table.  General anesthesia was induced under invasive hemodynamic monitoring.  The chest, abdomen, and legs were prepped with Betadine and draped as a sterile field.  A proper time- out was performed.  A transesophageal echo probe had been placed by the anesthesia team.  A sternal incision was made as the saphenous vein was harvested endoscopically from the right leg.  The left internal mammary artery was harvested as a pedicle graft from its origin at the subclavian vessels. The sternal retractor was placed and the pericardium was opened.  There was no evidence of cancer in the mediastinum, in the areas which were visualized.  Pursestrings were placed in ascending aorta and right atrium, and heparin was administered.  When the ACT was documented as being therapeutic, the patient was cannulated and placed on cardiopulmonary bypass.  The coronaries were identified for grafting and the mammary artery and vein grafts were prepared for the distal  anastomoses. Cardioplegia cannulas were placed in both antegrade and retrograde cold blood cardioplegia.  The patient was cooled to 32 degrees.  The aortic crossclamp was applied.  One liter of cold blood cardioplegia was delivered in split doses between the antegrade aortic and retrograde coronary sinus catheters.  There was good cardioplegic arrest and septal temperature dropped less than 12 degrees.  Cardioplegia was delivered every 20 minutes.  The distal coronary anastomoses were performed.  The first distal anastomosis was to the posterior descending.  This was a heavily diseased vessel with 90% stenosis.  A reverse saphenous vein was sewn end-to-side with running 7-0 Prolene with good flow through the graft. Cardioplegia was redosed.  The second distal anastomosis was the OM branch of the left coronary. This was a smaller 1.4-mm vessel with proximal 90% stenosis.  A reverse saphenous vein was sewn end-to-side with running 7-0 Prolene with good flow through the graft.  Cardioplegia was redosed.  The third distal anastomosis was to the diagonal branch to the LAD. This was heavily calcified with 90% stenosis.  A reverse saphenous vein was sewn end-to-side with running 7-0 Prolene with good flow through the graft.  Cardioplegia was redosed.  The fourth distal anastomosis was the distal third of the LAD.  More proximally, he was heavily calcified with 90% stenosis.  The left IMA pedicle was brought through an opening and the left lateral pericardium was brought down onto the LAD and sewn end-to-side with a running 8-0 Prolene.  There was good flow through the anastomosis after briefly releasing the pedicle bulldog on the mammary artery.  The bulldog was reapplied and the pedicle was secured to the epicardium.  Cardioplegia was redosed.  While the crossclamp was still in place, 3 proximal vein anastomoses were performed in the ascending aorta using a 4.5-mm punch running  6-0 Prolene.  Prior to tying down the final proximal anastomosis, air was vented from the coronaries with a dose of retrograde warm blood cardioplegia.  The crossclamp was removed.  The vein grafts were de-aired and opened and each had good flow and hemostasis was documented at the proximal and distal anastomoses. Temporary pacing wires were applied.  The patient was rewarmed and reperfused.  Low-dose dopamine and Milrinone were started.  The lungs were expanded and ventilator was resumed.  The patient was then weaned from cardiopulmonary bypass without difficulty.  Hemodynamics were stable.  Echo showed preserved LV systolic function, although still abnormal approximately 25% to 30%.  Protamine was administered without adverse reaction.  The cannulas were removed.  The mediastinum was irrigated.  The superior pericardial fat was closed over the aorta. Anterior mediastinal and left pleural chest tubes were placed and brought out through separate incisions.  The sternum was closed with wire.  The pectoralis fascia was closed with a running #1 Vicryl.  The subcutaneous and skin layers were closed in running Vicryl and sterile dressings were applied.  Total cardiopulmonary bypass  time was 140 minutes.     Ivin Poot, M.D.     PV/MEDQ  D:  03/24/2017  T:  03/24/2017  Job:  [id number]

## 2017-03-26 NOTE — Progress Notes (Signed)
K+= 3.7 and creat= 0.78 w/ urine o/p > 30cc/hr; TCTS KCL protocol initiated with 10 mEq KCL in 50cc IV x 3, each over one hour.

## 2017-03-27 ENCOUNTER — Inpatient Hospital Stay (HOSPITAL_COMMUNITY): Payer: Medicare Other

## 2017-03-27 LAB — GLUCOSE, CAPILLARY
GLUCOSE-CAPILLARY: 98 mg/dL (ref 65–99)
GLUCOSE-CAPILLARY: 221 mg/dL — AB (ref 65–99)
GLUCOSE-CAPILLARY: 236 mg/dL — AB (ref 65–99)
Glucose-Capillary: 153 mg/dL — ABNORMAL HIGH (ref 65–99)
Glucose-Capillary: 84 mg/dL (ref 65–99)

## 2017-03-27 LAB — BASIC METABOLIC PANEL
Anion gap: 8 (ref 5–15)
BUN: 15 mg/dL (ref 6–20)
CO2: 27 mmol/L (ref 22–32)
Calcium: 7.8 mg/dL — ABNORMAL LOW (ref 8.9–10.3)
Chloride: 99 mmol/L — ABNORMAL LOW (ref 101–111)
Creatinine, Ser: 0.77 mg/dL (ref 0.61–1.24)
GFR calc Af Amer: >60 mL/min (ref >60)
GFR calc non Af Amer: >60 mL/min (ref >60)
Glucose, Bld: 105 mg/dL — ABNORMAL HIGH (ref 65–99)
Potassium: 3.6 mmol/L (ref 3.5–5.1)
Sodium: 134 mmol/L — ABNORMAL LOW (ref 135–145)

## 2017-03-27 LAB — CBC
HCT: 25.2 % — ABNORMAL LOW (ref 39.0–52.0)
Hemoglobin: 8.5 g/dL — ABNORMAL LOW (ref 13.0–17.0)
MCH: 28.7 pg (ref 26.0–34.0)
MCHC: 33.7 g/dL (ref 30.0–36.0)
MCV: 85.1 fL (ref 78.0–100.0)
Platelets: 204 10*3/uL (ref 150–400)
RBC: 2.96 MIL/uL — ABNORMAL LOW (ref 4.22–5.81)
RDW: 13.4 % (ref 11.5–15.5)
WBC: 8.9 10*3/uL (ref 4.0–10.5)

## 2017-03-27 LAB — COOXEMETRY PANEL
Carboxyhemoglobin: 1.5 % (ref 0.5–1.5)
Methemoglobin: 1.2 % (ref 0.0–1.5)
O2 Saturation: 52.5 %
Total hemoglobin: 8.3 g/dL — ABNORMAL LOW (ref 12.0–16.0)

## 2017-03-27 MED ORDER — MAGNESIUM HYDROXIDE 400 MG/5ML PO SUSP
30.0000 mL | Freq: Every day | ORAL | Status: DC | PRN
Start: 2017-03-27 — End: 2017-03-31
  Filled 2017-03-27: qty 30

## 2017-03-27 MED ORDER — MOVING RIGHT ALONG BOOK
Freq: Once | Status: AC
  Administered 2017-03-27: 09:00:00
  Filled 2017-03-27: qty 1

## 2017-03-27 MED ORDER — ENOXAPARIN SODIUM 40 MG/0.4ML ~~LOC~~ SOLN: 40.0000 mg | SUBCUTANEOUS | Status: DC

## 2017-03-27 MED ORDER — SODIUM CHLORIDE 0.9 % IV SOLN
250.0000 mL | INTRAVENOUS | Status: DC | PRN
  Administered 2017-03-28: 250 mL via INTRAVENOUS

## 2017-03-27 MED ORDER — SODIUM CHLORIDE 0.9% FLUSH: 3.0000 mL | INTRAVENOUS | Status: DC | PRN

## 2017-03-27 MED ORDER — FUROSEMIDE 40 MG PO TABS
40.0000 mg | ORAL_TABLET | Freq: Every day | ORAL | Status: DC
  Administered 2017-03-27 – 2017-03-31 (×5): 40 mg via ORAL
  Filled 2017-03-27 (×5): qty 1

## 2017-03-27 MED ORDER — AMIODARONE HCL 200 MG PO TABS
200.0000 mg | ORAL_TABLET | Freq: Two times a day (BID) | ORAL | Status: DC
  Administered 2017-03-27 – 2017-03-31 (×9): 200 mg via ORAL
  Filled 2017-03-27 (×8): qty 1

## 2017-03-27 MED ORDER — POTASSIUM CHLORIDE 10 MEQ/50ML IV SOLN
10.0000 meq | INTRAVENOUS | Status: DC | PRN
  Filled 2017-03-27: qty 50

## 2017-03-27 MED ORDER — POTASSIUM CHLORIDE CRYS ER 20 MEQ PO TBCR
20.0000 meq | EXTENDED_RELEASE_TABLET | Freq: Every day | ORAL | Status: DC
  Administered 2017-03-27 – 2017-03-31 (×5): 20 meq via ORAL
  Filled 2017-03-27 (×5): qty 1

## 2017-03-27 MED ORDER — ENOXAPARIN SODIUM 30 MG/0.3ML ~~LOC~~ SOLN
30.0000 mg | SUBCUTANEOUS | Status: DC
  Administered 2017-03-27: 30 mg via SUBCUTANEOUS
  Filled 2017-03-27: qty 0.3

## 2017-03-27 MED ORDER — INSULIN ASPART 100 UNIT/ML ~~LOC~~ SOLN
0.0000 [IU] | Freq: Three times a day (TID) | SUBCUTANEOUS | Status: DC
Start: 2017-03-27 — End: 2017-03-31
  Administered 2017-03-27: 8 [IU] via SUBCUTANEOUS
  Administered 2017-03-28: 2 [IU] via SUBCUTANEOUS
  Administered 2017-03-28: 8 [IU] via SUBCUTANEOUS
  Administered 2017-03-28: 12 [IU] via SUBCUTANEOUS
  Administered 2017-03-29: 4 [IU] via SUBCUTANEOUS
  Administered 2017-03-29: 8 [IU] via SUBCUTANEOUS
  Administered 2017-03-29: 12 [IU] via SUBCUTANEOUS
  Administered 2017-03-30: 4 [IU] via SUBCUTANEOUS
  Administered 2017-03-30: 8 [IU] via SUBCUTANEOUS
  Administered 2017-03-30: 2 [IU] via SUBCUTANEOUS
  Administered 2017-03-30: 16 [IU] via SUBCUTANEOUS
  Administered 2017-03-31: 4 [IU] via SUBCUTANEOUS

## 2017-03-27 MED ORDER — SODIUM CHLORIDE 0.9% FLUSH
3.0000 mL | Freq: Two times a day (BID) | INTRAVENOUS | Status: DC
  Administered 2017-03-28 – 2017-03-31 (×6): 3 mL via INTRAVENOUS

## 2017-03-27 NOTE — Progress Notes (Signed)
4 Days Post-Op Procedure(s) (LRB): CORONARY ARTERY BYPASS GRAFTING (CABG) x , four using left internal mammary artery and right greater saphenous vein harvested endoscopically (N/A) TRANSESOPHAGEAL ECHOCARDIOGRAM (TEE) (N/A) ENDOVEIN HARVEST OF GREATER SAPHENOUS VEIN (Right) Subjective: ready for tx to stepdown On mil for low co-ox In nsr Edema better  CXR pending- hx stage 3 lung Ca Objective: Vital signs in last 24 hours: Temp:  [97.8 F (36.6 C)-99.3 F (37.4 C)] 98.9 F (37.2 C) (07/31 0800) Pulse Rate:  [89-107] 95 (07/31 0800) Cardiac Rhythm: Normal sinus rhythm (07/31 0800) Resp:  [12-23] 14 (07/31 0800) BP: (102-153)/(59-86) 128/69 (07/31 0800) SpO2:  [93 %-100 %] 95 % (07/31 0800) Weight:  [165 lb 2 oz (74.9 kg)] 165 lb 2 oz (74.9 kg) (07/31 0500)  Hemodynamic parameters for last 24 hours:  stable  Intake/Output from previous day: 07/30 0701 - 07/31 0700 In: 1245.6 [P.O.:720; I.V.:475.6; IV Piggyback:50] Out: 2735 [Urine:2685; Chest Tube:50] Intake/Output this shift: Total I/O In: 12.7 [I.V.:12.7] Out: 125 [Urine:125]       Exam    General- alert and comfortable   Lungs- clear without rales, wheezes   Cor- regular rate and rhythm, no murmur , gallop   Abdomen- soft, non-tender   Extremities - warm, non-tender, minimal edema   Neuro- oriented, appropriate, no focal weakness  Lab Results:  Recent Labs  03/26/17 0339 03/27/17 0435  WBC 8.2 8.9  HGB 8.2* 8.5*  HCT 24.9* 25.2*  PLT 149* 204   BMET:  Recent Labs  03/26/17 0339 03/27/17 0435  NA 132* 134*  K 3.7 3.6  CL 98* 99*  CO2 27 27  GLUCOSE 147* 105*  BUN 17 15  CREATININE 0.78 0.77  CALCIUM 7.6* 7.8*    PT/INR: No results for input(s): LABPROT, INR in the last 72 hours. ABG    Component Value Date/Time   PHART 7.395 03/24/2017 0234   HCO3 23.1 03/24/2017 0234   TCO2 24 03/25/2017 1620   ACIDBASEDEF 1.0 03/24/2017 0234   O2SAT 52.5 03/27/2017 0435   CBG (last 3)   Recent  Labs  03/26/17 2314 03/27/17 0304 03/27/17 0757  GLUCAP 126* 98 153*    Assessment/Plan: S/P Procedure(s) (LRB): CORONARY ARTERY BYPASS GRAFTING (CABG) x , four using left internal mammary artery and right greater saphenous vein harvested endoscopically (N/A) TRANSESOPHAGEAL ECHOCARDIOGRAM (TEE) (N/A) ENDOVEIN HARVEST OF GREATER SAPHENOUS VEIN (Right) Mobilize Diuresis Diabetes control Plan for transfer to step-down: see transfer orders   LOS: 10 days    Tharon Aquas Trigt III 03/27/2017

## 2017-03-27 NOTE — Progress Notes (Signed)
CARDIAC REHAB PHASE I  Pt has ambulated twice today, in bed now resting. Encouraged additional ambulation x1 today. Will follow up tomorrow.   Lenna Sciara, RN, BSN 03/27/2017 2:19 PM

## 2017-03-28 ENCOUNTER — Inpatient Hospital Stay (HOSPITAL_COMMUNITY): Payer: Medicare Other

## 2017-03-28 LAB — CBC
HCT: 25.6 % — ABNORMAL LOW (ref 39.0–52.0)
Hemoglobin: 8.4 g/dL — ABNORMAL LOW (ref 13.0–17.0)
MCH: 28.3 pg (ref 26.0–34.0)
MCHC: 32.8 g/dL (ref 30.0–36.0)
MCV: 86.2 fL (ref 78.0–100.0)
Platelets: 250 10*3/uL (ref 150–400)
RBC: 2.97 MIL/uL — ABNORMAL LOW (ref 4.22–5.81)
RDW: 13.5 % (ref 11.5–15.5)
WBC: 7.9 10*3/uL (ref 4.0–10.5)

## 2017-03-28 LAB — GLUCOSE, CAPILLARY
GLUCOSE-CAPILLARY: 117 mg/dL — AB (ref 65–99)
GLUCOSE-CAPILLARY: 131 mg/dL — AB (ref 65–99)
GLUCOSE-CAPILLARY: 210 mg/dL — AB (ref 65–99)
GLUCOSE-CAPILLARY: 251 mg/dL — AB (ref 65–99)
GLUCOSE-CAPILLARY: 271 mg/dL — AB (ref 65–99)

## 2017-03-28 LAB — BASIC METABOLIC PANEL
Anion gap: 8 (ref 5–15)
BUN: 12 mg/dL (ref 6–20)
CO2: 28 mmol/L (ref 22–32)
Calcium: 8.1 mg/dL — ABNORMAL LOW (ref 8.9–10.3)
Chloride: 100 mmol/L — ABNORMAL LOW (ref 101–111)
Creatinine, Ser: 0.77 mg/dL (ref 0.61–1.24)
GFR calc Af Amer: >60 mL/min (ref >60)
GFR calc non Af Amer: >60 mL/min (ref >60)
Glucose, Bld: 84 mg/dL (ref 65–99)
Potassium: 3.5 mmol/L (ref 3.5–5.1)
Sodium: 136 mmol/L (ref 135–145)

## 2017-03-28 MED ORDER — POTASSIUM CHLORIDE CRYS ER 20 MEQ PO TBCR
40.0000 meq | EXTENDED_RELEASE_TABLET | Freq: Once | ORAL | Status: AC
  Administered 2017-03-28: 40 meq via ORAL
  Filled 2017-03-28: qty 2

## 2017-03-28 MED ORDER — INSULIN DETEMIR 100 UNIT/ML ~~LOC~~ SOLN
26.0000 [IU] | Freq: Two times a day (BID) | SUBCUTANEOUS | Status: DC
  Administered 2017-03-28 – 2017-03-31 (×7): 26 [IU] via SUBCUTANEOUS
  Filled 2017-03-28 (×8): qty 0.26

## 2017-03-28 MED ORDER — ENOXAPARIN SODIUM 30 MG/0.3ML ~~LOC~~ SOLN
30.0000 mg | SUBCUTANEOUS | Status: DC
  Administered 2017-03-29 – 2017-03-30 (×2): 30 mg via SUBCUTANEOUS
  Filled 2017-03-28 (×3): qty 0.3

## 2017-03-28 MED ORDER — METOPROLOL TARTRATE 25 MG PO TABS
25.0000 mg | ORAL_TABLET | Freq: Two times a day (BID) | ORAL | Status: DC
  Administered 2017-03-28 – 2017-03-29 (×4): 25 mg via ORAL
  Filled 2017-03-28 (×4): qty 1

## 2017-03-28 NOTE — Discharge Summary (Signed)
Physician Discharge Summary       [address]       [place] [address]             [phone]    Patient ID: George Griffin MRN: [id number] DOB/AGE: [birth year] 56 y.o.  Admit date: 03/17/2017 Discharge date: 03/31/2017  Admission Diagnoses: 1. Non-STEMI (non-ST elevated myocardial infarction) ([place]) 2. Ischemic cardiomyopathy 3. Coronary artery disease  Active Diagnoses:  1. Hyperlipidemia with target low density lipoprotein (LDL) cholesterol less than 70 mg/dL 2. Hypertension 3. DM2 (diabetes mellitus, type 2) ([place]) 4. History of lung cancer 5. Acute systolic congestive heart failure ([place]) 6. Acquired hypothyroidism 7. ED (erectile dysfunction) 8. Hepatic steatosis 9. Neuropathy of finger 10. Urinary retention 11. Post op a fib-converted to SR   Procedure (s):  Left Heart Cath and Coronary Angiography by Dr. Saralyn Pilar on 03/16/2017:  Conclusion     Ost Cx to Prox Cx lesion, 75 %stenosed.  Prox LAD to Mid LAD lesion, 95 %stenosed.  Dist LAD lesion, 60 %stenosed.  Ost 1st Diag to 1st Diag lesion, 90 %stenosed.  Mid RCA lesion, 80 %stenosed.  Dist RCA lesion, 95 %stenosed.  Ost RPDA to RPDA lesion, 75 %stenosed.  2nd RPLB lesion, 75 %stenosed.  RPDA lesion, 90 %stenosed.   1. Severe three-vessel coronary artery disease with current crit high-grade stenosis mid LAD, 80% stenosis mid RCA with diffuse distal involvement, irregular 75% stenosis proximal left circumflex 2. Severe dilated cardiomyopathy   Right Heart Catheterization done by Dr. Aundra Dubin on 03/21/2017:  Right Heart Pressures RHC Procedural Findings: Hemodynamics (mmHg) RA mean 4 RV 23/5 PA 26/8, mean 18 PCWP mean 7  Oxygen saturations: PA 60% AO 93%  Cardiac Output ([place]) 3.72  Cardiac Index (Fick) 2.1      Coronary artery bypass grafting x4 (left internal mammary artery to left anterior descending artery, saphenous vein graft to diagonal, saphenous vein graft to  obtuse marginal, saphenous vein graft to posterior descending) by Dr. Prescott Gum on 03/25/2007.  History of Presenting Illness: The patient is a 56 year old male with a known history of lung cancer status post chemotherapy and radiation who presented to the emergency department  regional yesterday with complaints of shortness of breath. Additionally he describes a squeezing type chest pain for approximately 1 day. He was stabilized in the emergency department and did rule in for non-STEMI. He was seen in cardiology consultation and felt to require cardiac catheterization which was done and revealed significant multivessel coronary artery disease. We are asked to see the patient in consultation for consideration of surgical revascularization. Peak troponin thus far is 1.92. The patient has multiple cardiac comorbidities including non-insulin-dependent diabetes, hyperlipidemia and hypertension. 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  Patient had a CTA of chest which showed no PE, coronary arteriosclerosis aortic atherosclerosis, new moderate bilateral pleural effusions with compressive atelectasis and ground-glass perihilar airspace opacities suspicious for stigmata of pulmonary edema. Superimposed atypical infection, alveolitis or pneumonitis is not entirely excluded. Patient underwent a right heart catheterization on 03/21/2017. PFTs showed low diffusion capacity but FEV1 was ok. Dr. Prescott Gum reviewed the findings. Pre operative carotid duplex showed no significant internal carotid artery stenosis bilaterally. Dr. Prescott Gum discussed the need for coronary artery bypass grafting surgery. Potential risks, benefits, and complications were discussed with the patient and he agreed to proceed with surgery. He underwent a CABG x 4 on 03/24/2017.  Brief Hospital Course:  The patient was  extubated the morning of post operative day one without difficulty. He remained  afebrile and hemodynamically stable. He was weaned off of Milrinone drip after co ox stabilized. Gordy Councilman, a line, chest tubes, and foley were removed early in the post operative course. Lopressor was started and titrated accordingly. He was volume over loaded and diuresed. He had ABL anemia. H did not require a post op transfusion. Last H and H was 8.4 and 25.6 . He was started on oral Ferrous sulfate and folic acid. He was weaned off the insulin drip.   He did go into a fib and was put on a Amiodarone drip. He was also put on Digoxin.  Level was checked after a few doses and remained at 0.7. He converted and remained in sinus rhtythm. The patient's glucose remained well controlled.The patient's HGA1C pre op was 11.7. He will require close follow up with his medical doctor after discharge. The patient was felt surgically stable for transfer from the ICU to PCTU for further convalescence on 03/27/2017. He continues to progress with cardiac rehab. He was ambulating on room air. He has been tolerating a diet and has had a bowel movement. He has remained on a Milrinone drip. Latest co ox is 51.2. As discussed with Dr. Prescott Gum, will stop Milrinone drip and remove central line. Also, we stopped Lopressor and changed to Coreg. He was started on Flomax for urinary retention. He was able to void on his own and the foley did NOT need to be reinserted. Epicardial pacing wires were removed on 03/28/2017. Chest tube sutures will be removed the day of discharge. The patient is felt surgically stable for discharge today.  Latest Vital Signs: Blood pressure 113/75, pulse (!) 101, temperature 97.9 F (36.6 C), temperature source Oral, resp. rate 17, height 5\' 7"  (1.702 m), weight 159 lb 3.2 oz (72.2 kg), SpO2 98 %.  Physical Exam: Cardiovascular: RRR Pulmonary: Slightly diminished at bases Abdomen: Soft, non tender, bowel sounds present. Extremities: Mild bilateral lower extremity edema. Wounds: Clean and dry.  No  erythema or signs of infection.   Discharge Condition: Stable and discharged to home.  Recent laboratory studies:  Lab Results  Component Value Date   WBC 8.6 03/30/2017   HGB 8.2 (L) 03/30/2017   HCT 25.4 (L) 03/30/2017   MCV 87.3 03/30/2017   PLT 293 03/30/2017   Lab Results  Component Value Date   NA 136 03/30/2017   K 4.0 03/30/2017   CL 102 03/30/2017   CO2 28 03/30/2017   CREATININE 0.72 03/30/2017   GLUCOSE 135 (H) 03/30/2017    Diagnostic Studies: Dg Chest 2 View  Result Date: 03/28/2017 CLINICAL DATA:  Open heart surgery. Slight chest pain. Cough and congestion. EXAM: CHEST  2 VIEW COMPARISON:  03/27/2017. FINDINGS: Improved cardiomediastinal silhouette. Decreasing edema. Small LEFT effusion. No pneumothorax. Unchanged RIGHT IJ central venous line. Platelike atelectasis RIGHT hilum. IMPRESSION: Improved aeration. Electronically Signed   By: Staci Righter M.D.   On: 03/28/2017 07:36   Ct Head W & Wo Contrast  Result Date: 03/19/2017 CLINICAL DATA:  F/U on lung cancer( for abd/pelvis too ). EXAM: CT HEAD WITHOUT AND WITH CONTRAST TECHNIQUE: Contiguous axial images were obtained from the base of the skull through the vertex without and with intravenous contrast CONTRAST:  100 ml isovue 300 iv COMPARISON:  11/01/2010 FINDINGS: Brain: Mild atrophy. No evidence of acute infarction, hemorrhage, hydrocephalus, extra-axial collection or mass lesion/mass effect. No areas of unexpected enhancement after IV contrast  administration. Vascular: No hyperdense vessel or unexpected calcification. Visible vessels are patent.Calcified plaque in bilateral ICAs. Skull: Normal. Negative for fracture or focal lesion. Sinuses/Orbits: Mucoperiosteal thickening in bilateral maxillary sinuses. Retention cyst or polyp in the right maxillary sinus. Remainder visualized paranasal sinuses and mastoid air cells appear normally developed and well aerated. Other: None IMPRESSION: 1. Negative for bleed, acute  intracranial process, or metastatic disease. Electronically Signed   By: Lucrezia Europe M.D.   On: 03/19/2017 19:55   Ct Angio Chest Pe W Or Wo Contrast  Result Date: 03/17/2017 CLINICAL DATA:  Dyspnea. History of lung cancer post chemotherapy and radiation. EXAM: CT ANGIOGRAPHY CHEST WITH CONTRAST TECHNIQUE: Multidetector CT imaging of the chest was performed using the standard protocol during bolus administration of intravenous contrast. Multiplanar CT image reconstructions and MIPs were obtained to evaluate the vascular anatomy. CONTRAST:  100 cc Isovue 370 IV COMPARISON:  CXR 03/24/2017 and chest CT 04/17/2016 FINDINGS: Cardiovascular: Heart size is normal without pericardial effusion. There is coronary arteriosclerosis aortic atherosclerosis. No aneurysm. Satisfactory opacification of the pulmonary arterial system to the subsegmental level. No pulmonary embolus. Mediastinum/Nodes: Right distal paraesophageal 10 mm short axis lymph node is slightly larger than 5 mm seen on the 2017 comparison. No mediastinal adenopathy. Lungs/Pleura: Paraseptal emphysema with chronic right perihilar airway thickening and parenchymal hilar opacities likely representing chronic post therapy change. As there does appear to be more soft tissue consolidation, a new superimposed pneumonia is not entirely excluded. Moderate bilateral pleural effusions are seen with adjacent compressive atelectasis. Ground-glass bilateral perihilar opacities may reflect changes of pulmonary edema given the pleural effusions present. Upper Abdomen: No acute abnormality. Musculoskeletal: Thoracic spondylosis. No acute lytic or blastic disease. Review of the MIP images confirms the above findings. IMPRESSION: 1. New moderate bilateral pleural effusions with compressive atelectasis and ground-glass perihilar airspace opacities suspicious for stigmata of pulmonary edema. Superimposed atypical infection, alveolitis or pneumonitis is not entirely excluded. 2.  Chronic post therapy change involving the hila with peribronchial thickening and consolidations noted. Slightly more confluent soft tissue consolidation about the right hilum since prior exam may represent a new superimposed pneumonia. 3. Slight increase in distal paraesophageal lymph node size not 10 mm short axis versus 5 mm previously. 4. No acute pulmonary embolus. 5. Coronary arteriosclerosis aortic atherosclerosis. Aortic Atherosclerosis (ICD10-I70.0) and Emphysema (ICD10-J43.9). Electronically Signed   By: Ashley Royalty M.D.   On: 03/17/2017 19:45   Ct Abdomen Pelvis W Contrast  Result Date: 03/19/2017 CLINICAL DATA:  Coronary artery disease. F/U on lung cancer, hilus EXAM: CT ABDOMEN AND PELVIS WITH CONTRAST TECHNIQUE: Multidetector CT imaging of the abdomen and pelvis was performed using the standard protocol following bolus administration of intravenous contrast. CONTRAST:  100 ml isovue 300 iv^<See Chart> ISOVUE-300 IOPAMIDOL (ISOVUE-300) INJECTION 61% COMPARISON:  CT chest 03/17/17; CT abdomen 03/25/2012 and previous FINDINGS: Lower chest: Small bilateral pleural effusions slightly improved. Improved aeration posteriorly in both lower lobes. Right perihilar soft tissue, incompletely visualized. Hepatobiliary: No focal liver abnormality is seen. No gallstones, gallbladder wall thickening, or biliary dilatation. Pancreas: Unremarkable. No pancreatic ductal dilatation or surrounding inflammatory changes. Spleen: Normal in size without focal abnormality. Adrenals/Urinary Tract: 13 mm soft tissue attenuation left adrenal nodule stable since 11/03/2010. Negative kidneys. No hydronephrosis or ureterectasis. Urinary bladder physiologically distended. Stomach/Bowel: Stomach, small bowel, colon nondilated. Appendix not discretely identified. No wall thickening or adjacent inflammatory/ edematous change identified. Vascular/Lymphatic: Moderate plaque in the infrarenal abdominal aorta and bilateral common iliac  arteries. No aneurysm or  high-grade stenosis. No abdominal or pelvic adenopathy localized. Early contrast opacification in the right common femoral and external iliac veins. Reproductive: Moderate prostatic enlargement with central coarse calcifications. Other: No ascites.  No free air. Musculoskeletal: Spurring in the visualized lower thoracic and lumbar spine. Facet DJD L5-S1. No fracture or worrisome bone lesion. IMPRESSION: 1. Right perihilar disease, incompletely visualized. 2. Some interval decrease in bilateral pleural effusions. 3. No evidence of metastatic disease in the abdomen or pelvis. 4. Moderate infrarenal aortic and iliac arterial plaque. 5. 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 Note that the patient did have right femoral arterial access 03/16/2017 for cardiac catheterization. Consider ultrasound for further evaluation. Electronically Signed   By: Lucrezia Europe M.D.   On: 03/19/2017 19:52   Discharge Instructions    Amb Referral to Cardiac Rehabilitation    Complete by:  As directed    Diagnosis:   CABG NSTEMI     CABG X ___:  4     Discharge Medications: Allergies as of 03/31/2017      Reactions   No Known Allergies       Medication List    STOP taking these medications   heparin 100-0.45 UNIT/ML-% infusion     TAKE these medications   acetaminophen 325 MG tablet Commonly known as:  TYLENOL Take 650 mg by mouth every 6 (six) hours as needed for mild pain.   amiodarone 200 MG tablet Commonly known as:  PACERONE Take 1 tablet (200 mg total) by mouth 2 (two) times daily. For 10 days, then decrease to 200 mg daily   aspirin 325 MG EC tablet Take 1 tablet (325 mg total) by mouth daily. What changed:  medication strength  how much to take   atorvastatin 80 MG tablet Commonly known as:  LIPITOR Take 1 tablet (80 mg total) by mouth daily at 6 PM.   carvedilol 12.5 MG tablet Commonly known as:  COREG Take 1  tablet (12.5 mg total) by mouth 2 (two) times daily with a meal. What changed:  medication strength  how much to take   digoxin 0.25 MG tablet Commonly known as:  LANOXIN Take 1 tablet (0.25 mg total) by mouth daily.   ferrous sulfate 325 (65 FE) MG tablet Take 1 tablet (325 mg total) by mouth daily with breakfast.   folic acid 1 MG tablet Commonly known as:  FOLVITE Take 1 tablet (1 mg total) by mouth daily.   furosemide 40 MG tablet Commonly known as:  LASIX Take 1 tablet (40 mg total) by mouth daily.   insulin detemir 100 UNIT/ML injection Commonly known as:  LEVEMIR Inject 40 Units into the skin at bedtime.   potassium chloride SA 20 MEQ tablet Commonly known as:  K-DUR,KLOR-CON Take 1 tablet (20 mEq total) by mouth daily.   tamsulosin 0.4 MG Caps capsule Commonly known as:  FLOMAX Take 1 capsule (0.4 mg total) by mouth daily.   traMADol 50 MG tablet Commonly known as:  ULTRAM Take 1-2 tablets (50-100 mg total) by mouth every 4 (four) hours as needed for moderate pain.            Durable Medical Equipment        Start     Ordered   03/30/17 0720  For home use only DME Walker rolling  Once    Question:  Patient needs a walker to treat with the following condition  Answer:  Balance problem   03/30/17  0719     The patient has been discharged on:   1.Beta Blocker:  Yes [ x  ]                              No   [   ]                              If No, reason:  2.Ace Inhibitor/ARB: Yes [   ]                                     No  [  x  ]                                     If No, reason: labile BP  3.Statin:   Yes [ x  ]                  No  [   ]                  If No, reason:  4.Ecasa:  Yes  [ x  ]                  No   [   ]                  If No, reason:   Follow Up Appointments: Follow-up [facility] Follow up on 04/02/2017.   Why:  2:30 for follow up apt Contact information: [place] [address] [phone]       Ivin Poot, MD Follow up on 04/25/2017.   Specialty:  Cardiothoracic Surgery Why:  PA/LAT CXR to be taken (at [facility] which is in the same building as Dr. Lucianne Lei Trigt's office) on 04/25/2017 at 12:30 pm ;Appointment time is at 1:00 pm Contact information: [place] [address] [phone]        Isaias Cowman, MD. Call.   Specialty:  Cardiology Why:  Call for a follow up appointment for 2 weeks Contact information: [facility] [place] [address] [place] Follow up.   Why:  rolling walker arranged- to be delivered to room prior to discharge.  Contact information: [address] [place] [address] [phone]           Signed: Cinda Quest 03/31/2017, 9:35 AM

## 2017-03-28 NOTE — Progress Notes (Signed)
CARDIAC REHAB PHASE I   PRE:  Rate/Rhythm: 98 SR  BP:  Sitting: 125/81        SaO2: 96 RA  MODE:  Ambulation: 420 ft   POST:  Rate/Rhythm: 110 ST  BP:  Sitting: 118/72         SaO2: 94 RA  Pt required verbal reminder cues for sternal precautions, pt very reliant on arms, c/o sternal pain. Pt ambulated 420 ft on RA, IV, rolling walker, assist x1, steady gait, tolerated fairly well. Pt c/o increased DOE, some dizziness, fatigue with distance, standing rest x1. Pt states he feels more tired and short of breath today. Encouraged IS, reinforced sternal precautions. Pt to recliner after walk, feet elevated, call bell within reach. Will follow.   [id number] Lenna Sciara, RN, BSN 03/28/2017 10:48 AM

## 2017-03-28 NOTE — Progress Notes (Addendum)
      [address]       [place] [address]             [phone]        5 Days Post-Op Procedure(s) (LRB): CORONARY ARTERY BYPASS GRAFTING (CABG) x , four using left internal mammary artery and right greater saphenous vein harvested endoscopically (N/A) TRANSESOPHAGEAL ECHOCARDIOGRAM (TEE) (N/A) ENDOVEIN HARVEST OF GREATER SAPHENOUS VEIN (Right)  Subjective: Patient without complaints this am.  Objective: Vital signs in last 24 hours: Temp:  [98.4 F (36.9 C)-99.2 F (37.3 C)] 98.8 F (37.1 C) (08/01 0428) Pulse Rate:  [88-103] 88 (07/31 1600) Cardiac Rhythm: Normal sinus rhythm (08/01 0700) Resp:  [11-22] 11 (07/31 1600) BP: (100-146)/(54-79) 122/77 (08/01 0428) SpO2:  [94 %-97 %] 96 % (07/31 1600) Weight:  [74.3 kg (163 lb 12.8 oz)] 74.3 kg (163 lb 12.8 oz) (08/01 0428)  Pre op weight 73.2 kg Current Weight  03/28/17 74.3 kg (163 lb 12.8 oz)      Intake/Output from previous day: 07/31 0701 - 08/01 0700 In: 303.5 [P.O.:240; I.V.:63.5] Out: 1950 [Urine:1950]   Physical Exam:  Cardiovascular: RRR Pulmonary: Slightly diminished at bases Abdomen: Soft, non tender, bowel sounds present. Extremities: Mild bilateral lower extremity edema. Wounds: Clean and dry.  No erythema or signs of infection.  Lab Results: CBC: Recent Labs  03/27/17 0435 03/28/17 0408  WBC 8.9 7.9  HGB 8.5* 8.4*  HCT 25.2* 25.6*  PLT 204 250   BMET:  Recent Labs  03/27/17 0435 03/28/17 0408  NA 134* 136  K 3.6 3.5  CL 99* 100*  CO2 27 28  GLUCOSE 105* 84  BUN 15 12  CREATININE 0.77 0.77  CALCIUM 7.8* 8.1*    PT/INR:  Lab Results  Component Value Date   INR 1.45 03/23/2017   INR 1.09 03/22/2017   INR 0.97 03/18/2017   ABG:  INR: Will add last result for INR, ABG once components are confirmed Will add last 4 CBG results once components are confirmed  Assessment/Plan:  1. CV - Previous a fib. Tachycardic in the low 100's at times;SR in the 90's  this am. On Milrinone drip,Amiodarone 200 mg bid, Digoxin 0.25 mg daily, Lopressor 12.5 mg bid. Will increase Lopressor to 25 mg bid. Will order co ox and determine if able to stop Milrinone drip. 2.  Pulmonary - On room air. CXR this am appears to show bibasilar atelectasis, small pleural effusions, no pneumothorax. Encourage incentive spirometer. 3. Volume Overload - On Lasix 40 mg daily 4.  Acute blood loss anemia - H and H stable at 8.4 and 25.6 5. DM-CBGs 236/251/117. On Insulin but will increase for better glucose control. Pre op HGA1C 11.7. He will need close medical follow up after discharge. 6. Remove EPW 7. Supplement potassium  ZIMMERMAN,DONIELLE MPA-C 03/28/2017,7:08 AM   patient examined and medical record reviewed,agree with above note. Tharon Aquas Trigt III 03/28/2017

## 2017-03-28 NOTE — Progress Notes (Signed)
Removed epicardial wires per order. 3 intact.  Pt tolerated procedure well.  Pt instructed to remain on bedrest for one hour.  Frequent vitals will be taken and documented. Pt resting with call bell within reach. Mikiya Nebergall McClintock, RN   

## 2017-03-29 LAB — CBC
HCT: 25.2 % — ABNORMAL LOW (ref 39.0–52.0)
Hemoglobin: 8 g/dL — ABNORMAL LOW (ref 13.0–17.0)
MCH: 27.7 pg (ref 26.0–34.0)
MCHC: 31.7 g/dL (ref 30.0–36.0)
MCV: 87.2 fL (ref 78.0–100.0)
Platelets: 270 10*3/uL (ref 150–400)
RBC: 2.89 MIL/uL — ABNORMAL LOW (ref 4.22–5.81)
RDW: 13.7 % (ref 11.5–15.5)
WBC: 7.7 10*3/uL (ref 4.0–10.5)

## 2017-03-29 LAB — GLUCOSE, CAPILLARY
GLUCOSE-CAPILLARY: 98 mg/dL (ref 65–99)
GLUCOSE-CAPILLARY: 283 mg/dL — AB (ref 65–99)
Glucose-Capillary: 187 mg/dL — ABNORMAL HIGH (ref 65–99)
Glucose-Capillary: 277 mg/dL — ABNORMAL HIGH (ref 65–99)

## 2017-03-29 LAB — BASIC METABOLIC PANEL
Anion gap: 6 (ref 5–15)
BUN: 12 mg/dL (ref 6–20)
CO2: 29 mmol/L (ref 22–32)
Calcium: 8.3 mg/dL — ABNORMAL LOW (ref 8.9–10.3)
Chloride: 103 mmol/L (ref 101–111)
Creatinine, Ser: 0.73 mg/dL (ref 0.61–1.24)
GFR calc Af Amer: >60 mL/min (ref >60)
GFR calc non Af Amer: >60 mL/min (ref >60)
Glucose, Bld: 96 mg/dL (ref 65–99)
Potassium: 3.7 mmol/L (ref 3.5–5.1)
Sodium: 138 mmol/L (ref 135–145)

## 2017-03-29 LAB — COOXEMETRY PANEL
CARBOXYHEMOGLOBIN: 1.8 % — AB (ref 0.5–1.5)
METHEMOGLOBIN: 1.2 % (ref 0.0–1.5)
O2 SAT: 55.4 %
TOTAL HEMOGLOBIN: 8.4 g/dL — AB (ref 12.0–16.0)

## 2017-03-29 MED ORDER — LACTULOSE 10 GM/15ML PO SOLN
20.0000 g | Freq: Once | ORAL | Status: AC
  Administered 2017-03-29: 20 g via ORAL
  Filled 2017-03-29: qty 30

## 2017-03-29 MED ORDER — TAMSULOSIN HCL 0.4 MG PO CAPS
0.4000 mg | ORAL_CAPSULE | Freq: Every day | ORAL | Status: DC
  Administered 2017-03-29 – 2017-03-31 (×3): 0.4 mg via ORAL
  Filled 2017-03-29 (×3): qty 1

## 2017-03-29 MED ORDER — FERROUS SULFATE 325 (65 FE) MG PO TABS
325.0000 mg | ORAL_TABLET | Freq: Every day | ORAL | Status: DC
  Administered 2017-03-29 – 2017-03-31 (×3): 325 mg via ORAL
  Filled 2017-03-29 (×4): qty 1

## 2017-03-29 MED ORDER — FOLIC ACID 1 MG PO TABS
1.0000 mg | ORAL_TABLET | Freq: Every day | ORAL | Status: DC
  Administered 2017-03-29 – 2017-03-31 (×3): 1 mg via ORAL
  Filled 2017-03-29 (×3): qty 1

## 2017-03-29 MED ORDER — POTASSIUM CHLORIDE CRYS ER 20 MEQ PO TBCR
30.0000 meq | EXTENDED_RELEASE_TABLET | Freq: Once | ORAL | Status: AC
  Administered 2017-03-29: 09:00:00 30 meq via ORAL
  Filled 2017-03-29: qty 1

## 2017-03-29 NOTE — Progress Notes (Addendum)
      [address]       [place] [address]             [phone]        6 Days Post-Op Procedure(s) (LRB): CORONARY ARTERY BYPASS GRAFTING (CABG) x , four using left internal mammary artery and right greater saphenous vein harvested endoscopically (N/A) TRANSESOPHAGEAL ECHOCARDIOGRAM (TEE) (N/A) ENDOVEIN HARVEST OF GREATER SAPHENOUS VEIN (Right)  Subjective: Patient has had urinary retention and constipation according to nurse. Daughter is at bedside as patient does not understand [place].  Objective: Vital signs in last 24 hours: Temp:  [97.7 F (36.5 C)-99.3 F (37.4 C)] 98.4 F (36.9 C) (08/02 0438) Pulse Rate:  [87-93] 92 (08/02 0438) Cardiac Rhythm: Normal sinus rhythm (08/02 0700) Resp:  [13-20] 15 (08/02 0438) BP: (108-137)/(60-78) 129/60 (08/02 0438) SpO2:  [93 %-99 %] 94 % (08/02 0438) Weight:  [75.5 kg (166 lb 7.2 oz)] 75.5 kg (166 lb 7.2 oz) (08/02 0438)  Pre op weight 73.2 kg Current Weight  03/29/17 75.5 kg (166 lb 7.2 oz)      Intake/Output from previous day: 08/01 0701 - 08/02 0700 In: 630.5 [P.O.:480; I.V.:150.5] Out: 550 [Urine:550]   Physical Exam:  Cardiovascular: RRR Pulmonary: Slightly diminished at bases Abdomen: Soft, non tender, bowel sounds present. Extremities: Mild bilateral lower extremity edema. Wounds: Clean and dry.  No erythema or signs of infection.  Lab Results: CBC:  Recent Labs  03/28/17 0408 03/29/17 0400  WBC 7.9 7.7  HGB 8.4* 8.0*  HCT 25.6* 25.2*  PLT 250 270   BMET:   Recent Labs  03/28/17 0408 03/29/17 0400  NA 136 138  K 3.5 3.7  CL 100* 103  CO2 28 29  GLUCOSE 84 96  BUN 12 12  CREATININE 0.77 0.73  CALCIUM 8.1* 8.3*    PT/INR:  Lab Results  Component Value Date   INR 1.45 03/23/2017   INR 1.09 03/22/2017   INR 0.97 03/18/2017   ABG:  INR: Will add last result for INR, ABG once components are confirmed Will add last 4 CBG results once components are  confirmed  Assessment/Plan:  1. CV - Previous a fib. Tachycardic in the low 100's at times;SR in the 90's this am. On Milrinone drip,Amiodarone 200 mg bid, Digoxin 0.25 mg daily, Lopressor 25 mg bid.  Check co ox and determine if able to stop Milrinone drip. 2.  Pulmonary - On room air.  Encourage incentive spirometer. 3. Volume Overload - On Lasix 40 mg daily 4.  Acute blood loss anemia - H and H stable at 8 and 25.2. Start oral Ferrous and folic acid. 5. DM-CBGs 210/271/98. On Insulin. Pre op HGA1C 11.7. He will need close medical follow up after discharge. 6. GU-nurse reported no voiding last evening and bladder scan 212 ml. Will start Flomax , bladder scan, and monitor. May need foley reinserted. 7. LOC constipation 8. Supplement potassium  ZIMMERMAN,DONIELLE MPA-C 03/29/2017,7:16 AM

## 2017-03-29 NOTE — Progress Notes (Signed)
CARDIAC REHAB PHASE I   PRE:  Rate/Rhythm: 103 ST  BP:  Sitting: 133/70        SaO2: 96 RA  MODE:  Ambulation: 470 ft   POST:  Rate/Rhythm: 114 ST  BP:  Sitting: 139/70         SaO2: 99 RA  Pt ambulated 470 ft on RA, IV, rolling walker, assist x1, mostly steady gait, tolerated well with no complaints. Pt states he thinks he would like a RW for home use, will notify case manager. Pt states he thinks he might go home tomorrow, will need [place] interpreter for discharge education. Pt to recliner after walk, feet elevated, call bell within reach. Will follow.   Ford Cliff, RN, BSN 03/29/2017 10:42 AM

## 2017-03-29 NOTE — Progress Notes (Signed)
Patient has not voided tonight.Patient does not feel uncomfort. Bladder Scan got 212 ml

## 2017-03-29 NOTE — Progress Notes (Signed)
Inpatient Diabetes Program Recommendations  AACE/ADA: New Consensus Statement on Inpatient Glycemic Control (2015)  Target Ranges:  Prepandial:   less than 140 mg/dL      Peak postprandial:   less than 180 mg/dL (1-2 hours)      Critically ill patients:  140 - 180 mg/dL   Lab Results  Component Value Date   GLUCAP 283 (H) 03/29/2017   HGBA1C 11.7 (H) 03/20/2017    Review of Glycemic Control Results for George Griffin, George Griffin (MRN [id number]) as of 03/29/2017 12:41  Ref. Range 03/28/2017 11:25 03/28/2017 15:38 03/28/2017 21:08 03/29/2017 06:11 03/29/2017 11:23  Glucose-Capillary Latest Ref Range: 65 - 99 mg/dL 131 (H) 210 (H) 271 (H) 98 283 (H)   Inpatient Diabetes Program Recommendations:Noted postprandial hyperglycemia. Insulin - Meal Coverage: Please reorder Novolog 8 units TID with meals for meal coverage if patient eats at least 50% of meals.  Thank you, Nani Gasser. Mayola Mcbain, RN, MSN, CDE  Diabetes Coordinator Inpatient Glycemic Control Team Team Pager [phone] (8am-5pm) 03/29/2017 12:41 PM

## 2017-03-29 NOTE — Progress Notes (Signed)
Bladder scan was 538. Patient was able to urinate 550 ml. Paged Donyelle at [phone] to advise. No call back at this time. Patient resting comfortably. Daughter at bedside call light within reach.

## 2017-03-30 LAB — BASIC METABOLIC PANEL
Anion gap: 6 (ref 5–15)
BUN: 10 mg/dL (ref 6–20)
CO2: 28 mmol/L (ref 22–32)
Calcium: 8.7 mg/dL — ABNORMAL LOW (ref 8.9–10.3)
Chloride: 102 mmol/L (ref 101–111)
Creatinine, Ser: 0.72 mg/dL (ref 0.61–1.24)
GFR calc Af Amer: >60 mL/min (ref >60)
GFR calc non Af Amer: >60 mL/min (ref >60)
Glucose, Bld: 135 mg/dL — ABNORMAL HIGH (ref 65–99)
Potassium: 4 mmol/L (ref 3.5–5.1)
Sodium: 136 mmol/L (ref 135–145)

## 2017-03-30 LAB — CBC
HCT: 25.4 % — ABNORMAL LOW (ref 39.0–52.0)
Hemoglobin: 8.2 g/dL — ABNORMAL LOW (ref 13.0–17.0)
MCH: 28.2 pg (ref 26.0–34.0)
MCHC: 32.3 g/dL (ref 30.0–36.0)
MCV: 87.3 fL (ref 78.0–100.0)
Platelets: 293 10*3/uL (ref 150–400)
RBC: 2.91 MIL/uL — ABNORMAL LOW (ref 4.22–5.81)
RDW: 14 % (ref 11.5–15.5)
WBC: 8.6 10*3/uL (ref 4.0–10.5)

## 2017-03-30 LAB — GLUCOSE, CAPILLARY
GLUCOSE-CAPILLARY: 129 mg/dL — AB (ref 65–99)
GLUCOSE-CAPILLARY: 316 mg/dL — AB (ref 65–99)
GLUCOSE-CAPILLARY: 222 mg/dL — AB (ref 65–99)
GLUCOSE-CAPILLARY: 173 mg/dL — AB (ref 65–99)
Glucose-Capillary: 313 mg/dL — ABNORMAL HIGH (ref 65–99)

## 2017-03-30 LAB — COOXEMETRY PANEL
CARBOXYHEMOGLOBIN: 1.8 % — AB (ref 0.5–1.5)
METHEMOGLOBIN: 1.1 % (ref 0.0–1.5)
O2 SAT: 51.2 %
Total hemoglobin: 8.5 g/dL — ABNORMAL LOW (ref 12.0–16.0)

## 2017-03-30 MED ORDER — CARVEDILOL 12.5 MG PO TABS
12.5000 mg | ORAL_TABLET | Freq: Two times a day (BID) | ORAL | Status: DC
  Administered 2017-03-30 – 2017-03-31 (×3): 12.5 mg via ORAL
  Filled 2017-03-30 (×2): qty 1

## 2017-03-30 NOTE — Progress Notes (Signed)
CARDIAC REHAB PHASE I   PRE:  Rate/Rhythm: 101 ST    BP: sitting 132/72    SaO2: 94 RA  MODE:  Ambulation: 750 ft   POST:  Rate/Rhythm: 117 ST    BP: sitting 133/75     SaO2: 96 RA  Pt moving well although relied on his daughter to get out of bed holding pillow (pt was stiff). Steady walking with RW. Sts after walk that he does not need RW. He should walk without it at some point. To recliner. We will try to educate this pm through translator. [phone]   [place], ACSM 03/30/2017 10:45 AM

## 2017-03-30 NOTE — Progress Notes (Addendum)
      [address]       [place] [address]             [phone]        7 Days Post-Op Procedure(s) (LRB): CORONARY ARTERY BYPASS GRAFTING (CABG) x , four using left internal mammary artery and right greater saphenous vein harvested endoscopically (N/A) TRANSESOPHAGEAL ECHOCARDIOGRAM (TEE) (N/A) ENDOVEIN HARVEST OF GREATER SAPHENOUS VEIN (Right)  Subjective: Patient able to void on own and he had a bowel movement yesterday. Daughter is at bedside as patient does not understand [place].  Objective: Vital signs in last 24 hours: Temp:  [97.6 F (36.4 C)-99.1 F (37.3 C)] 97.6 F (36.4 C) (08/03 0428) Pulse Rate:  [86-102] 91 (08/03 0428) Cardiac Rhythm: Sinus tachycardia (08/02 1900) Resp:  [12-22] 17 (08/03 0428) BP: (99-128)/(63-71) 122/70 (08/03 0428) SpO2:  [94 %-97 %] 95 % (08/03 0428) Weight:  [77.1 kg (169 lb 15.6 oz)] 77.1 kg (169 lb 15.6 oz) (08/03 0428)  Pre op weight 73.2 kg Current Weight  03/30/17 77.1 kg (169 lb 15.6 oz)      Intake/Output from previous day: 08/02 0701 - 08/03 0700 In: 360 [P.O.:360] Out: 2250 [Urine:2250]   Physical Exam:  Cardiovascular: RRR Pulmonary: Slightly diminished at bases Abdomen: Soft, non tender, bowel sounds present. Extremities: Mild bilateral lower extremity edema. Wounds: Clean and dry.  No erythema or signs of infection.  Lab Results: CBC:  Recent Labs  03/29/17 0400 03/30/17 0437  WBC 7.7 8.6  HGB 8.0* 8.2*  HCT 25.2* 25.4*  PLT 270 293   BMET:   Recent Labs  03/29/17 0400 03/30/17 0437  NA 138 136  K 3.7 4.0  CL 103 102  CO2 29 28  GLUCOSE 96 135*  BUN 12 10  CREATININE 0.73 0.72  CALCIUM 8.3* 8.7*    PT/INR:  Lab Results  Component Value Date   INR 1.45 03/23/2017   INR 1.09 03/22/2017   INR 0.97 03/18/2017   ABG:  INR: Will add last result for INR, ABG once components are confirmed Will add last 4 CBG results once components are  confirmed  Assessment/Plan:  1. CV - Previous a fib. Tachycardic in the low 100's at times;SR in the 90's this am. On Milrinone drip, Amiodarone 200 mg bid, Digoxin 0.25 mg daily, Lopressor 25 mg bid.  Co ox decreased to 51.2  continue Milrinone drip for now and Dr. Prescott Gum to decide when to stop Milrinone. Will check Dig level in am as is on 0.25 mg. 2.  Pulmonary - On room air.  Encourage incentive spirometer. 3. Volume Overload - On Lasix 40 mg daily 4.  Acute blood loss anemia - H and H stable at 8 and 25.2. Continue oral Ferrous and folic acid. 5. DM-CBGs 210/271/98. On Insulin. Pre op HGA1C 11.7. He will need close medical follow up after discharge. 6. GU-Previous urinary retention. Much improved UO,continue Flomax  ZIMMERMAN,DONIELLE MPA-C 03/30/2017,7:18 AM  patient examined and medical record reviewed,agree with above note. Tharon Aquas Trigt III 03/30/2017

## 2017-03-30 NOTE — Progress Notes (Signed)
Ed completed with pt and daughter through interpreter. Good questions, voiced understanding. Discussed HF, DM, ex, restrictions, daily wts. Will refer to [place]. Pt does not read English but I left materials. [place] CES, ACSM 2:17 PM 03/30/2017

## 2017-03-31 LAB — GLUCOSE, CAPILLARY: Glucose-Capillary: 182 mg/dL — ABNORMAL HIGH (ref 65–99)

## 2017-03-31 LAB — DIGOXIN LEVEL: Digoxin Level: 0.7 ng/mL — ABNORMAL LOW (ref 0.8–2.0)

## 2017-03-31 MED ORDER — FOLIC ACID 1 MG PO TABS: 1.0000 mg | ORAL_TABLET | Freq: Every day | ORAL | 0 refills | Status: DC

## 2017-03-31 MED ORDER — ASPIRIN 325 MG PO TBEC: 325.0000 mg | DELAYED_RELEASE_TABLET | Freq: Every day | ORAL | 0 refills | Status: DC

## 2017-03-31 MED ORDER — CARVEDILOL 12.5 MG PO TABS: 12.5000 mg | ORAL_TABLET | Freq: Two times a day (BID) | ORAL | 3 refills | Status: DC

## 2017-03-31 MED ORDER — POTASSIUM CHLORIDE CRYS ER 20 MEQ PO TBCR: 20.0000 meq | EXTENDED_RELEASE_TABLET | Freq: Every day | ORAL | 1 refills | Status: DC

## 2017-03-31 MED ORDER — TRAMADOL HCL 50 MG PO TABS: 50.0000 mg | ORAL_TABLET | ORAL | 0 refills | Status: DC | PRN

## 2017-03-31 MED ORDER — TAMSULOSIN HCL 0.4 MG PO CAPS: 0.4000 mg | ORAL_CAPSULE | Freq: Every day | ORAL | 0 refills | Status: DC

## 2017-03-31 MED ORDER — AMIODARONE HCL 200 MG PO TABS: 200.0000 mg | ORAL_TABLET | Freq: Two times a day (BID) | ORAL | 1 refills | Status: DC

## 2017-03-31 MED ORDER — FERROUS SULFATE 325 (65 FE) MG PO TABS: 325.0000 mg | ORAL_TABLET | Freq: Every day | ORAL | 0 refills | Status: DC

## 2017-03-31 MED ORDER — FUROSEMIDE 40 MG PO TABS: 40.0000 mg | ORAL_TABLET | Freq: Every day | ORAL | 1 refills | Status: DC

## 2017-03-31 MED ORDER — DIGOXIN 250 MCG PO TABS: 0.2500 mg | ORAL_TABLET | Freq: Every day | ORAL | 1 refills | Status: DC

## 2017-03-31 NOTE — Progress Notes (Addendum)
      [address]       [place] [address]             [phone]      8 Days Post-Op Procedure(s) (LRB): CORONARY ARTERY BYPASS GRAFTING (CABG) x , four using left internal mammary artery and right greater saphenous vein harvested endoscopically (N/A) TRANSESOPHAGEAL ECHOCARDIOGRAM (TEE) (N/A) ENDOVEIN HARVEST OF GREATER SAPHENOUS VEIN (Right)   Subjective:  Patients daughter provided translation.  Patient is doing well, some mild upper back pain this morning.  Wants to go home.  Objective: Vital signs in last 24 hours: Temp:  [97.9 F (36.6 C)-98.3 F (36.8 C)] 97.9 F (36.6 C) (08/04 0400) Pulse Rate:  [96-101] 101 (08/03 1600) Cardiac Rhythm: Sinus tachycardia (08/03 1900) Resp:  [17-18] 17 (08/04 0400) BP: (112-133)/(60-77) 113/75 (08/04 0400) SpO2:  [94 %-98 %] 98 % (08/03 1600) Weight:  [159 lb 3.2 oz (72.2 kg)] 159 lb 3.2 oz (72.2 kg) (08/04 0400)  Intake/Output from previous day: 08/03 0701 - 08/04 0700 In: -  Out: 1325 [Urine:1325]  General appearance: alert, cooperative and no distress Heart: regular rate and rhythm Lungs: diminished breath sounds bibasilar Abdomen: soft, non-tender; bowel sounds normal; no masses,  no organomegaly Extremities: edema mild bilaterally Wound: clean and dry  Lab Results:  Recent Labs  03/29/17 0400 03/30/17 0437  WBC 7.7 8.6  HGB 8.0* 8.2*  HCT 25.2* 25.4*  PLT 270 293   BMET:  Recent Labs  03/29/17 0400 03/30/17 0437  NA 138 136  K 3.7 4.0  CL 103 102  CO2 29 28  GLUCOSE 96 135*  BUN 12 10  CREATININE 0.73 0.72  CALCIUM 8.3* 8.7*    PT/INR: No results for input(s): LABPROT, INR in the last 72 hours. ABG    Component Value Date/Time   PHART 7.395 03/24/2017 0234   HCO3 23.1 03/24/2017 0234   TCO2 24 03/25/2017 1620   ACIDBASEDEF 1.0 03/24/2017 0234   O2SAT 51.2 03/30/2017 0445   CBG (last 3)   Recent Labs  03/30/17 1626 03/30/17 2145 03/31/17 0549  GLUCAP 222* 173* 182*     Assessment/Plan: S/P Procedure(s) (LRB): CORONARY ARTERY BYPASS GRAFTING (CABG) x , four using left internal mammary artery and right greater saphenous vein harvested endoscopically (N/A) TRANSESOPHAGEAL ECHOCARDIOGRAM (TEE) (N/A) ENDOVEIN HARVEST OF GREATER SAPHENOUS VEIN (Right)  1. Previous A, Fib, currently mild sinus Tach- Milrinone off yesterday- Continue Amiodarone, Coreg, on Digoxin at 0.25 mg... Level pending for this morning 2. Pulm- no acute issues, off oxygen, history of Stage 3 lung cancer 3. Renal- creatinine WNL, remains edematous on exam, but weight is trending down, continue diuretics 4. GU- urinary retention, improved, will give 30 days of flomax 5. DM- poorly controlled preoperatively, continue current regimen for now.. Will need close follow up with PCP 6. Dispo- patient stable, will check Dig level once complete if necessary adjust medications, plan to d/c home today    Addendum: Dig level is 0.7, continue at current dose  LOS: 14 days    George Griffin 03/31/2017

## 2017-03-31 NOTE — Progress Notes (Addendum)
Patient provided with [facility] letter for weekend DC to bridge cost of medications until he can be seen at [facility]  Referral made to [facility] for charity RW to be delivered to room prior to DC

## 2017-03-31 NOTE — Progress Notes (Signed)
Discharged to home with family office visits in place teaching done  

## 2017-04-02 ENCOUNTER — Ambulatory Visit: Payer: Medicaid Other | Attending: Internal Medicine | Admitting: Physician Assistant

## 2017-04-02 VITALS — BP 103/60 | HR 106 | Temp 98.8°F | Resp 18 | Ht 67.0 in | Wt 160.0 lb

## 2017-04-02 DIAGNOSIS — Z7982 Long term (current) use of aspirin: Secondary | ICD-10-CM | POA: Insufficient documentation

## 2017-04-02 DIAGNOSIS — I25810 Atherosclerosis of coronary artery bypass graft(s) without angina pectoris: Secondary | ICD-10-CM

## 2017-04-02 DIAGNOSIS — E039 Hypothyroidism, unspecified: Secondary | ICD-10-CM | POA: Insufficient documentation

## 2017-04-02 DIAGNOSIS — I10 Essential (primary) hypertension: Secondary | ICD-10-CM | POA: Diagnosis not present

## 2017-04-02 DIAGNOSIS — Z85118 Personal history of other malignant neoplasm of bronchus and lung: Secondary | ICD-10-CM | POA: Insufficient documentation

## 2017-04-02 DIAGNOSIS — Z923 Personal history of irradiation: Secondary | ICD-10-CM | POA: Insufficient documentation

## 2017-04-02 DIAGNOSIS — Z951 Presence of aortocoronary bypass graft: Secondary | ICD-10-CM | POA: Insufficient documentation

## 2017-04-02 DIAGNOSIS — E785 Hyperlipidemia, unspecified: Secondary | ICD-10-CM | POA: Diagnosis not present

## 2017-04-02 DIAGNOSIS — I214 Non-ST elevation (NSTEMI) myocardial infarction: Secondary | ICD-10-CM | POA: Diagnosis not present

## 2017-04-02 DIAGNOSIS — Z8249 Family history of ischemic heart disease and other diseases of the circulatory system: Secondary | ICD-10-CM | POA: Diagnosis not present

## 2017-04-02 DIAGNOSIS — I2510 Atherosclerotic heart disease of native coronary artery without angina pectoris: Secondary | ICD-10-CM | POA: Insufficient documentation

## 2017-04-02 DIAGNOSIS — Z794 Long term (current) use of insulin: Secondary | ICD-10-CM | POA: Diagnosis not present

## 2017-04-02 DIAGNOSIS — I255 Ischemic cardiomyopathy: Secondary | ICD-10-CM | POA: Diagnosis not present

## 2017-04-02 DIAGNOSIS — E1140 Type 2 diabetes mellitus with diabetic neuropathy, unspecified: Secondary | ICD-10-CM

## 2017-04-02 DIAGNOSIS — N529 Male erectile dysfunction, unspecified: Secondary | ICD-10-CM | POA: Diagnosis not present

## 2017-04-02 DIAGNOSIS — R000 Tachycardia, unspecified: Secondary | ICD-10-CM | POA: Insufficient documentation

## 2017-04-02 DIAGNOSIS — I480 Paroxysmal atrial fibrillation: Secondary | ICD-10-CM

## 2017-04-02 DIAGNOSIS — Z09 Encounter for follow-up examination after completed treatment for conditions other than malignant neoplasm: Secondary | ICD-10-CM | POA: Insufficient documentation

## 2017-04-02 DIAGNOSIS — Z9221 Personal history of antineoplastic chemotherapy: Secondary | ICD-10-CM | POA: Diagnosis not present

## 2017-04-02 LAB — GLUCOSE, POCT (MANUAL RESULT ENTRY): POC Glucose: 231 mg/dl — AB (ref 70–99)

## 2017-04-02 MED ORDER — AMIODARONE HCL 200 MG PO TABS: 200.0000 mg | ORAL_TABLET | Freq: Every day | ORAL | 3 refills | Status: DC

## 2017-04-02 MED ORDER — DIGOXIN 250 MCG PO TABS: 0.2500 mg | ORAL_TABLET | Freq: Every day | ORAL | 3 refills | Status: DC

## 2017-04-02 MED ORDER — INSULIN DETEMIR 100 UNIT/ML ~~LOC~~ SOLN: 44.0000 [IU] | Freq: Every day | SUBCUTANEOUS | 3 refills | Status: DC

## 2017-04-02 MED ORDER — FUROSEMIDE 40 MG PO TABS: 40.0000 mg | ORAL_TABLET | Freq: Every day | ORAL | 3 refills | Status: DC

## 2017-04-02 MED ORDER — CARVEDILOL 12.5 MG PO TABS: 12.5000 mg | ORAL_TABLET | Freq: Two times a day (BID) | ORAL | 3 refills | Status: DC

## 2017-04-02 MED FILL — AMIODARONE HCL 200 MG TAB: 200 | 30 days supply | Qty: 30 | Fill #0

## 2017-04-02 MED FILL — CARVEDILOL 12.5 MG TABLET: 12.5 | 30 days supply | Qty: 60 | Fill #0

## 2017-04-02 MED FILL — DIGITEK 250 MCG TABLET: 250 | 30 days supply | Qty: 30 | Fill #0

## 2017-04-02 MED FILL — FUROSEMIDE 40 MG TABLET: 40 | 30 days supply | Qty: 30 | Fill #0

## 2017-04-02 NOTE — Progress Notes (Signed)
George Griffin  UXL:[id number]  ZDG:[id number]  DOB - 11-03-1960  Chief Complaint  Patient presents with  . Hospitalization Follow-up       Subjective:   George Griffin is a 56 y.o. male here today for establishment of care. He has a history of lung cancer status post chemotherapy and radiation therapy, diabetes mellitus type 2 with neuropathy, hypothyroidism, hyperlipidemia and hypertension. He presented to an outside hospital on 03/16/2017 with shortness of breath and ruled in for non-ST elevation myocardial infarction. He underwent cardiac catheterization with findings of three-vessel coronary artery disease. He was then transferred to [facility] for four-vessel coronary artery bypass grafting on 03/24/2017 by Dr. Prescott Gum [facility], VG>D1, OM1, & PDA). His hospital course was complicated by heart failure with ejection fraction 30-35%, hyperglycemia, hypotension and urinary retention. He also had some paroxysmal atrial fibrillation and is been placed on amiodarone.  He was discharged on postop day #8. Since discharge she's been compliant with his medications. No chest pain. He is still sore in his chest. Breathing has improved. He has a clear phlegm coming from his cough. His blood sugars are being assessed but her in the 300s. He does not have refills on his medications. He does have follow-up planned with cardiothoracic surgery but not with general cardiology.  Interpreter on site.   ROS: GEN: denies fever or chills, denies change in weight Skin: denies lesions or rashes HEENT: denies headache, earache, epistaxis, sore throat, or neck pain +cough with clear phlegm LUNGS: denies SHOB, dyspnea, PND, orthopnea CV: denies CP or palpitations ABD: denies abd pain, N or V EXT: denies muscle spasms or swelling; no pain in lower ext, no weakness NEURO: denies numbness or tingling, denies sz, stroke or TIA   ALLERGIES: Allergies  Allergen Reactions  . No Known Allergies     PAST MEDICAL  HISTORY: Past Medical History:  Diagnosis Date  . Acquired hypothyroidism 10/05/2015  . CAD (coronary artery disease), native coronary artery 03/17/2017  . Constipation   . Diabetes mellitus    metfomin  . ED (erectile dysfunction)   . Hepatic steatosis 07/26/11   severe ct chest  . Hyperlipidemia   . Hypertension   . Ischemic cardiomyopathy 03/17/2017  . Lung cancer ([place])    lung ca dx5/02/2011  . Neuropathy of finger    mild s/p chemotherapy  . Non-STEMI (non-ST elevated myocardial infarction) ([place]) 03/16/2017  . Pneumonia    hx    PAST SURGICAL HISTORY: Past Surgical History:  Procedure Laterality Date  . CORONARY ARTERY BYPASS GRAFT N/A 03/23/2017   Procedure: CORONARY ARTERY BYPASS GRAFTING (CABG) x , four using left internal mammary artery and right greater saphenous vein harvested endoscopically;  Surgeon: Ivin Poot, MD;  Location: [place];  Service: Open Heart Surgery;  Laterality: N/A;  . ENDOVEIN HARVEST OF GREATER SAPHENOUS VEIN Right 03/23/2017   Procedure: ENDOVEIN HARVEST OF GREATER SAPHENOUS VEIN;  Surgeon: Ivin Poot, MD;  Location: [place];  Service: Open Heart Surgery;  Laterality: Right;  . LEFT HEART CATH AND CORONARY ANGIOGRAPHY Left 03/16/2017   Procedure: Left Heart Cath and Coronary Angiography;  Surgeon: Isaias Cowman, MD;  Location: [place] CV LAB;  Service: Cardiovascular;  Laterality: Left;  . none    . RIGHT HEART CATH N/A 03/21/2017   Procedure: Right Heart Cath;  Surgeon: Larey Dresser, MD;  Location: [place] CV LAB;  Service: Cardiovascular;  Laterality: N/A;  . TEE WITHOUT CARDIOVERSION N/A 03/23/2017   Procedure: TRANSESOPHAGEAL  ECHOCARDIOGRAM (TEE);  Surgeon: Prescott Gum, Collier Salina, MD;  Location: [place];  Service: Open Heart Surgery;  Laterality: N/A;    MEDICATIONS AT HOME: Prior to Admission medications   Medication Sig Start Date End Date Taking? Authorizing Provider  amiodarone (PACERONE) 200 MG tablet Take 1 tablet (200 mg  total) by mouth daily. For 10 days, then decrease to 200 mg daily 04/30/17  Yes Ena Dawley, Cru Kritikos S, PA-C  aspirin EC 325 MG EC tablet Take 1 tablet (325 mg total) by mouth daily. 03/31/17  Yes Barrett, Erin R, PA-C  atorvastatin (LIPITOR) 80 MG tablet Take 1 tablet (80 mg total) by mouth daily at 6 PM. 03/17/17  Yes Sainani, Belia Heman, MD  carvedilol (COREG) 12.5 MG tablet Take 1 tablet (12.5 mg total) by mouth 2 (two) times daily with a meal. 04/30/17  Yes Ena Dawley, Chaden Doom S, PA-C  digoxin (LANOXIN) 0.25 MG tablet Take 1 tablet (0.25 mg total) by mouth daily. 04/30/17  Yes Ena Dawley, Cherryl Babin S, PA-C  ferrous sulfate 325 (65 FE) MG tablet Take 1 tablet (325 mg total) by mouth daily with breakfast. 03/31/17  Yes Barrett, Erin R, PA-C  folic acid (FOLVITE) 1 MG tablet Take 1 tablet (1 mg total) by mouth daily. 03/31/17  Yes Barrett, Erin R, PA-C  furosemide (LASIX) 40 MG tablet Take 1 tablet (40 mg total) by mouth daily. 04/30/17  Yes Ena Dawley, Siddhartha Hoback S, PA-C  insulin detemir (LEVEMIR) 100 UNIT/ML injection Inject 0.44 mLs (44 Units total) into the skin at bedtime. 04/30/17  Yes Ena Dawley, Mareo Portilla S, PA-C  potassium chloride SA (K-DUR,KLOR-CON) 20 MEQ tablet Take 1 tablet (20 mEq total) by mouth daily. 03/31/17  Yes Barrett, Erin R, PA-C  tamsulosin (FLOMAX) 0.4 MG CAPS capsule Take 1 capsule (0.4 mg total) by mouth daily. 03/31/17  Yes Barrett, Erin R, PA-C  traMADol (ULTRAM) 50 MG tablet Take 1-2 tablets (50-100 mg total) by mouth every 4 (four) hours as needed for moderate pain. 03/31/17  Yes Barrett, Erin R, PA-C  acetaminophen (TYLENOL) 325 MG tablet Take 650 mg by mouth every 6 (six) hours as needed for mild pain.     [provider]    Family History  Problem Relation Age of Onset  . CAD Neg Hx   . Hypertension Neg Hx   . Diabetes Neg Hx    Social-married, children form [place], nonsmoker no ETOH  Objective:   Vitals:   04/02/17 1439  BP: 103/60  Pulse: (!) 106  Resp: 18  Temp: 98.8 F (37.1 C)  TempSrc: Oral   SpO2: 96%  Weight: 160 lb (72.6 kg)  Height: 5\' 7"  (1.702 m)    Exam General appearance : Awake, alert, not in any distress. Speech Clear. Not toxic looking HEENT: Atraumatic and Normocephalic, pupils equally reactive to light and accomodation Neck: supple, no JVD. No cervical lymphadenopathy.  Chest:Good air entry bilaterally, no added sounds  CVS: S1 S2 regular, no murmurs.  Abdomen: Bowel sounds present, Non tender and not distended with no guarding, rigidity or rebound. Extremities: B/L Lower Ext shows no edema, both legs are warm to touch Neurology: Awake alert, and oriented X 3, CN II-XII intact, Non focal Skin:No Rash Wounds:N/A  Data Review Lab Results  Component Value Date   HGBA1C 11.7 (H) 03/20/2017   HGBA1C 11.9 (H) 03/18/2017   HGBA1C (H) 01/25/2011    8.8 (NOTE)  According to the ADA Clinical Practice Recommendations for 2011, when HbA1c is used as a screening test:   >=6.5%   Diagnostic of Diabetes Mellitus           (if abnormal result  is confirmed)  5.7-6.4%   Increased risk of developing Diabetes Mellitus  References:Diagnosis and Classification of Diabetes Mellitus,Diabetes ASNK,5397,67(HALPF 1):S62-S69 and Standards of Medical Care in         Diabetes - 2011,Diabetes [id number],4097,35  (Suppl 1):S11-S61.     Assessment & Plan  1. Recent NSTEMI  -GDMT  -RF modification  -make appt with CARDS 2. CAD s/p recent CABG  -Cont GDMT   -RF modificaiton  -apt with Dr. Prescott Gum 8/29 3. ICM EF 30-35%  -Cont BB  -BP won;t allow for ACE/ARB/Entresto currently  -repeat echo per CARDs 4. Tachycardia  -will follow  -no room to inc BB   -cont dig 5. HTN  -cont current meds 6. DM2  -cont Insulin  -encouraged to bring a log to next appt  -low CARB diet   Return in about 4 weeks (around 04/30/2017).  The patient was given clear instructions to go to ER or return to medical center if symptoms  don't improve, worsen or new problems develop. The patient verbalized understanding. The patient was told to call to get lab results if they haven't heard anything in the next week.   Total time spent with patient was 26 min. Greater than 50 % of this visit was spent face to face counseling and coordinating care regarding risk factor modification, compliance importance and encouragement, education related to DM, CAD and follow ups.  This note has been created with Surveyor, quantity. Any transcriptional errors are unintentional.    Zettie Pho, PA-C [facility] [place], [place]   04/02/2017, 3:07 PM

## 2017-04-03 MED FILL — !LEVEMIR 100 UNITS/ML VIAL: 100/ML | 22 days supply | Qty: 10 | Fill #0

## 2017-04-06 ENCOUNTER — Ambulatory Visit: Payer: Self-pay | Admitting: Family Medicine

## 2017-04-24 ENCOUNTER — Other Ambulatory Visit: Payer: Self-pay | Admitting: Cardiothoracic Surgery

## 2017-04-24 DIAGNOSIS — Z951 Presence of aortocoronary bypass graft: Secondary | ICD-10-CM

## 2017-04-25 ENCOUNTER — Ambulatory Visit
Admission: RE | Admit: 2017-04-25 | Discharge: 2017-04-25 | Disposition: A | Discharge status: Home / Self Care | Payer: Self-pay | Source: Ambulatory Visit | Attending: Cardiothoracic Surgery | Admitting: Cardiothoracic Surgery

## 2017-04-25 ENCOUNTER — Encounter: Payer: Self-pay | Admitting: Cardiothoracic Surgery

## 2017-04-25 ENCOUNTER — Ambulatory Visit (INDEPENDENT_AMBULATORY_CARE_PROVIDER_SITE_OTHER): Payer: Self-pay | Admitting: Cardiothoracic Surgery

## 2017-04-25 VITALS — BP 106/69 | HR 107 | Resp 16 | Ht 67.0 in | Wt 159.0 lb

## 2017-04-25 DIAGNOSIS — Z951 Presence of aortocoronary bypass graft: Secondary | ICD-10-CM

## 2017-04-25 DIAGNOSIS — I2510 Atherosclerotic heart disease of native coronary artery without angina pectoris: Secondary | ICD-10-CM

## 2017-04-25 NOTE — Progress Notes (Signed)
PCP is Jilda Panda, MD Referring Provider is Isaias Cowman, MD  Chief Complaint  Patient presents with  . Routine Post Op    s/p CABG X 4..03/24/17 with a CXR...HAS SEEN DR. PARACHOS    HPI: Patient returns for scheduled 1 month follow-up after multivessel CABG The patient has done well despite previously been treated for stage III lung cancer The patient denies symptoms of recurrent angina or CHF The surgical incisions are well-healed Last chest x-ray today shows clear lung fields no pleural effusion Rhythm strip done today shows sinus tachycardia heart rate 105 Patient has been free of atrial fibrillation for several weeks and amiodarone will be discontinued to reduce risk of pulmonary side effect Patient will be referred to outpatient cardiac rehabilitation at [facility] Past Medical History:  Diagnosis Date  . Acquired hypothyroidism 10/05/2015  . CAD (coronary artery disease), native coronary artery 03/17/2017  . Constipation   . Diabetes mellitus    metfomin  . ED (erectile dysfunction)   . Hepatic steatosis 07/26/11   severe ct chest  . Hyperlipidemia   . Hypertension   . Ischemic cardiomyopathy 03/17/2017  . Lung cancer ([place])    lung ca dx5/02/2011  . Neuropathy of finger    mild s/p chemotherapy  . Non-STEMI (non-ST elevated myocardial infarction) ([place]) 03/16/2017  . Pneumonia    hx    Past Surgical History:  Procedure Laterality Date  . CORONARY ARTERY BYPASS GRAFT N/A 03/23/2017   Procedure: CORONARY ARTERY BYPASS GRAFTING (CABG) x , four using left internal mammary artery and right greater saphenous vein harvested endoscopically;  Surgeon: Ivin Poot, MD;  Location: [place];  Service: Open Heart Surgery;  Laterality: N/A;  . ENDOVEIN HARVEST OF GREATER SAPHENOUS VEIN Right 03/23/2017   Procedure: ENDOVEIN HARVEST OF GREATER SAPHENOUS VEIN;  Surgeon: Ivin Poot, MD;  Location: [place];  Service: Open Heart Surgery;  Laterality: Right;  . LEFT  HEART CATH AND CORONARY ANGIOGRAPHY Left 03/16/2017   Procedure: Left Heart Cath and Coronary Angiography;  Surgeon: Isaias Cowman, MD;  Location: [place] CV LAB;  Service: Cardiovascular;  Laterality: Left;  . none    . RIGHT HEART CATH N/A 03/21/2017   Procedure: Right Heart Cath;  Surgeon: Larey Dresser, MD;  Location: [place] CV LAB;  Service: Cardiovascular;  Laterality: N/A;  . TEE WITHOUT CARDIOVERSION N/A 03/23/2017   Procedure: TRANSESOPHAGEAL ECHOCARDIOGRAM (TEE);  Surgeon: Prescott Gum, Collier Salina, MD;  Location: [place];  Service: Open Heart Surgery;  Laterality: N/A;    Family History  Problem Relation Age of Onset  . CAD Neg Hx   . Hypertension Neg Hx   . Diabetes Neg Hx     Social History Social History  Substance Use Topics  . Smoking status: Former Research scientist (life sciences)  . Smokeless tobacco: Never Used  . Alcohol use No    Current Outpatient Prescriptions  Medication Sig Dispense Refill  . acetaminophen (TYLENOL) 325 MG tablet Take 650 mg by mouth every 6 (six) hours as needed for mild pain.     Derrill Memo ON 04/30/2017] amiodarone (PACERONE) 200 MG tablet Take 1 tablet (200 mg total) by mouth daily. For 10 days, then decrease to 200 mg daily 30 tablet 3  . aspirin EC 325 MG EC tablet Take 1 tablet (325 mg total) by mouth daily. 30 tablet 0  . atorvastatin (LIPITOR) 80 MG tablet Take 1 tablet (80 mg total) by mouth daily at 6 PM.    . [START ON  04/30/2017] carvedilol (COREG) 12.5 MG tablet Take 1 tablet (12.5 mg total) by mouth 2 (two) times daily with a meal. 60 tablet 3  . [START ON 04/30/2017] digoxin (LANOXIN) 0.25 MG tablet Take 1 tablet (0.25 mg total) by mouth daily. 30 tablet 3  . ferrous sulfate 325 (65 FE) MG tablet Take 1 tablet (325 mg total) by mouth daily with breakfast. 30 tablet 0  . folic acid (FOLVITE) 1 MG tablet Take 1 tablet (1 mg total) by mouth daily. 30 tablet 0  . [START ON 04/30/2017] furosemide (LASIX) 40 MG tablet Take 1 tablet (40 mg total) by mouth  daily. 30 tablet 3  . [START ON 04/30/2017] insulin detemir (LEVEMIR) 100 UNIT/ML injection Inject 0.44 mLs (44 Units total) into the skin at bedtime. 10 mL 3  . potassium chloride SA (K-DUR,KLOR-CON) 20 MEQ tablet Take 1 tablet (20 mEq total) by mouth daily. 30 tablet 1  . tamsulosin (FLOMAX) 0.4 MG CAPS capsule Take 1 capsule (0.4 mg total) by mouth daily. 30 capsule 0   No current facility-administered medications for this visit.     Allergies  Allergen Reactions  . No Known Allergies     Review of Systems  Improved strength and appetite No drainage from incisions  BP 106/69 (BP Location: Right Arm, Patient Position: Sitting, Cuff Size: Large)   Pulse (!) 107   Resp 16   Ht 5\' 7"  (1.702 m)   Wt 159 lb (72.1 kg)   SpO2 96% Comment: ON RA  BMI 24.90 kg/m  Physical Exam      Exam    General- alert and comfortable   Lungs- clear without rales, wheezes   Cor- regular rate and rhythm, no murmur , gallop   Abdomen- soft, non-tender   Extremities - warm, non-tender, minimal edema   Neuro- oriented, appropriate, no focal weakness   Diagnostic Tests: Chest x-ray clear  Impression: Doing well one month after CABG  Plan: Patient may drive Patient may lift up to 20 pounds until 3 months after surgery Patient will stop his amiodarone but continue other medications Patient will return here as needed for surgical issues.  Len Childs, MD [facility] [phone]

## 2017-05-02 NOTE — Addendum Note (Signed)
Addended by: Fanny Bien R on: 05/02/2017 12:11 PM   Modules accepted: Orders

## 2017-05-11 ENCOUNTER — Ambulatory Visit: Payer: Self-pay | Admitting: Family Medicine

## 2017-05-14 ENCOUNTER — Telehealth: Payer: Self-pay | Admitting: Internal Medicine

## 2017-05-14 MED ORDER — ATORVASTATIN CALCIUM 80 MG PO TABS: 80.0000 mg | ORAL_TABLET | Freq: Every day | ORAL | 0 refills | Status: DC

## 2017-05-14 MED FILL — DIGITEK 250 MCG TABLET: 250 | 30 days supply | Qty: 30 | Fill #1

## 2017-05-14 MED FILL — !LEVEMIR 100 UNITS/ML VIAL: 100/ML | 22 days supply | Qty: 10 | Fill #1

## 2017-05-14 MED FILL — ATORVASTATIN 80 MG TABLET: 80 | 30 days supply | Qty: 30 | Fill #0

## 2017-05-14 MED FILL — ?CARVEDILOL 12.5 MG TABLET: 12.5 | 30 days supply | Qty: 60 | Fill #1

## 2017-05-14 NOTE — Telephone Encounter (Signed)
Refill Request:  Atorvastatin  Digoxin Carvedilol Insulin  Patient had an appointment on 9/14 to establish care but office was closed, next appt available is in October

## 2017-05-14 NOTE — Telephone Encounter (Signed)
Atorvastatin refilled. All other medications are currently being filled at pharmacy and did not need a refill.

## 2017-06-07 ENCOUNTER — Ambulatory Visit: Payer: Medicaid Other | Attending: Family Medicine | Admitting: Internal Medicine

## 2017-06-07 ENCOUNTER — Encounter: Payer: Self-pay | Admitting: Internal Medicine

## 2017-06-07 VITALS — BP 100/67 | HR 95 | Temp 98.2°F | Resp 16 | Wt 169.6 lb

## 2017-06-07 DIAGNOSIS — Z951 Presence of aortocoronary bypass graft: Secondary | ICD-10-CM | POA: Diagnosis not present

## 2017-06-07 DIAGNOSIS — Z7982 Long term (current) use of aspirin: Secondary | ICD-10-CM | POA: Insufficient documentation

## 2017-06-07 DIAGNOSIS — D649 Anemia, unspecified: Secondary | ICD-10-CM | POA: Diagnosis not present

## 2017-06-07 DIAGNOSIS — Z85118 Personal history of other malignant neoplasm of bronchus and lung: Secondary | ICD-10-CM | POA: Insufficient documentation

## 2017-06-07 DIAGNOSIS — Z590 Homelessness: Secondary | ICD-10-CM | POA: Diagnosis not present

## 2017-06-07 DIAGNOSIS — E1140 Type 2 diabetes mellitus with diabetic neuropathy, unspecified: Secondary | ICD-10-CM

## 2017-06-07 DIAGNOSIS — E785 Hyperlipidemia, unspecified: Secondary | ICD-10-CM | POA: Diagnosis not present

## 2017-06-07 DIAGNOSIS — R339 Retention of urine, unspecified: Secondary | ICD-10-CM | POA: Insufficient documentation

## 2017-06-07 DIAGNOSIS — Z79899 Other long term (current) drug therapy: Secondary | ICD-10-CM | POA: Insufficient documentation

## 2017-06-07 DIAGNOSIS — I214 Non-ST elevation (NSTEMI) myocardial infarction: Secondary | ICD-10-CM | POA: Diagnosis not present

## 2017-06-07 DIAGNOSIS — I25119 Atherosclerotic heart disease of native coronary artery with unspecified angina pectoris: Secondary | ICD-10-CM | POA: Insufficient documentation

## 2017-06-07 DIAGNOSIS — I10 Essential (primary) hypertension: Secondary | ICD-10-CM | POA: Diagnosis not present

## 2017-06-07 DIAGNOSIS — Z23 Encounter for immunization: Secondary | ICD-10-CM | POA: Diagnosis not present

## 2017-06-07 DIAGNOSIS — I2510 Atherosclerotic heart disease of native coronary artery without angina pectoris: Secondary | ICD-10-CM

## 2017-06-07 DIAGNOSIS — Z955 Presence of coronary angioplasty implant and graft: Secondary | ICD-10-CM | POA: Insufficient documentation

## 2017-06-07 DIAGNOSIS — I252 Old myocardial infarction: Secondary | ICD-10-CM | POA: Diagnosis not present

## 2017-06-07 DIAGNOSIS — Z9889 Other specified postprocedural states: Secondary | ICD-10-CM | POA: Diagnosis not present

## 2017-06-07 DIAGNOSIS — Z794 Long term (current) use of insulin: Secondary | ICD-10-CM | POA: Insufficient documentation

## 2017-06-07 DIAGNOSIS — E039 Hypothyroidism, unspecified: Secondary | ICD-10-CM

## 2017-06-07 DIAGNOSIS — Z87891 Personal history of nicotine dependence: Secondary | ICD-10-CM | POA: Diagnosis not present

## 2017-06-07 DIAGNOSIS — I255 Ischemic cardiomyopathy: Secondary | ICD-10-CM | POA: Insufficient documentation

## 2017-06-07 DIAGNOSIS — R634 Abnormal weight loss: Secondary | ICD-10-CM | POA: Insufficient documentation

## 2017-06-07 DIAGNOSIS — I480 Paroxysmal atrial fibrillation: Secondary | ICD-10-CM | POA: Diagnosis not present

## 2017-06-07 DIAGNOSIS — Z1159 Encounter for screening for other viral diseases: Secondary | ICD-10-CM

## 2017-06-07 LAB — POCT GLYCOSYLATED HEMOGLOBIN (HGB A1C): Hemoglobin A1C: 12.1

## 2017-06-07 LAB — GLUCOSE, POCT (MANUAL RESULT ENTRY): POC GLUCOSE: 235 mg/dL — AB (ref 70–99)

## 2017-06-07 MED ORDER — GABAPENTIN 300 MG PO CAPS: 300.0000 mg | ORAL_CAPSULE | Freq: Every day | ORAL | 3 refills | Status: DC

## 2017-06-07 MED ORDER — INSULIN ASPART 100 UNIT/ML ~~LOC~~ SOLN: 13.0000 [IU] | Freq: Three times a day (TID) | SUBCUTANEOUS | 11 refills | Status: DC

## 2017-06-07 MED ORDER — ATORVASTATIN CALCIUM 80 MG PO TABS: 80.0000 mg | ORAL_TABLET | Freq: Every day | ORAL | 6 refills | Status: DC

## 2017-06-07 MED ORDER — INSULIN DETEMIR 100 UNIT/ML ~~LOC~~ SOLN: 68.0000 [IU] | Freq: Every day | SUBCUTANEOUS | 3 refills | Status: DC

## 2017-06-07 MED FILL — ATORVASTATIN 80 MG TABLET: 80 | 30 days supply | Qty: 30 | Fill #0

## 2017-06-07 MED FILL — !NOVOLOG 100UNITS/ML VIAL: 100/ML | 25 days supply | Qty: 10 | Fill #0

## 2017-06-07 MED FILL — GABAPENTIN 300 MG CAPSULE: 300 | 30 days supply | Qty: 30 | Fill #0

## 2017-06-07 MED FILL — DIGITEK 250 MCG TABLET: 250 | 30 days supply | Qty: 30 | Fill #2

## 2017-06-07 MED FILL — ?CARVEDILOL 12.5 MG TABLET: 12.5 | 30 days supply | Qty: 60 | Fill #2

## 2017-06-07 MED FILL — !LEVEMIR 100 UNITS/ML VIAL: 100/ML | 29 days supply | Qty: 20 | Fill #0

## 2017-06-07 NOTE — Progress Notes (Signed)
Patient ID: George Griffin, male    DOB: Jan 26, 1961  MRN: [id number]  CC: Establish Care and Diabetes   Subjective: George Griffin is a 56 y.o. male who presents for chronic ds management. His concerns today include:  Hx of DM with neuropathy, hypothyroid, lung CA s/p chem/XRT HL, HTN, CAD/ICM s/p CABG x 4 with EF 30-35%, PAF Saw PA 03/2017 post hosp for NSTEMI, CABG. Hosp was complicated by PAF and urinary retention.  1. DM: BS: checks TID. Gives range of 300-400 Meds: taking 60 units of Lantus and 10 units Humalog TID. Out of Humalog x 2 days. Has a family doctor, Dr. Jilda Panda who provided him with samples. He does not plan to f/u with that doctor Diet: eats rice daily with plenty of fruits and veggies. Drinks mainly water Exercise: walking every morning for 20 mins Endorses numbness in hands and feet x 4 wks. Not able to sleep due to the numbness  2. CAD/ICM: saw CT surgeon in f/u 03/2017. Amiodarone d/c.Has appt with cardiologist next mth - Dr. Miquel Dunn in [place] associated with [facility] -some soreness in chest when he coughs/sneeze/drives. No CP with ambulation. No SOB.  -does not have bottles of Furosemide, potassium, Coreg and Lipitor. Has only Dig and ASA bottles with him today. Tells me he takes 2 other meds but do not recall the names.   3. Urinary retention: urinating ok. No longer on Flomax  4. Anemia: not sure if he is still taking iron No blood in stools  5. Hx of hypothyroid on chart but pt is not aware and not on any med  Patient Active Problem List   Diagnosis Date Noted  . S/P CABG x 4 03/23/2017  . Coronary artery disease involving native coronary artery of native heart with angina pectoris ([place]) 03/17/2017  . Ischemic cardiomyopathy 03/17/2017  . Non-STEMI (non-ST elevated myocardial infarction) ([place]) 03/16/2017  . Acquired hypothyroidism 10/05/2015  . Poor social situation 10/05/2015  . Weight loss, unintentional 10/05/2015  . Homeless 04/06/2014  . History  of lung cancer 08/08/2011  . DM2 (diabetes mellitus, type 2) ([place]) 08/03/2011  . Hyperlipidemia with target low density lipoprotein (LDL) cholesterol less than 70 mg/dL   . Diabetic neuropathy, type II diabetes mellitus ([place])   . Hepatic steatosis      Current Outpatient Prescriptions on File Prior to Visit  Medication Sig Dispense Refill  . aspirin EC 325 MG EC tablet Take 1 tablet (325 mg total) by mouth daily. 30 tablet 0  . carvedilol (COREG) 12.5 MG tablet Take 1 tablet (12.5 mg total) by mouth 2 (two) times daily with a meal. 60 tablet 3  . digoxin (LANOXIN) 0.25 MG tablet Take 1 tablet (0.25 mg total) by mouth daily. 30 tablet 3  . furosemide (LASIX) 40 MG tablet Take 1 tablet (40 mg total) by mouth daily. 30 tablet 3  . potassium chloride SA (K-DUR,KLOR-CON) 20 MEQ tablet Take 1 tablet (20 mEq total) by mouth daily. 30 tablet 1   No current facility-administered medications on file prior to visit.     Allergies  Allergen Reactions  . No Known Allergies     Social History   Social History  . Marital status: Married    Spouse name: N/A  . Number of children: N/A  . Years of education: N/A   Occupational History  .  Unemployed   Social History Main Topics  . Smoking status: Former Research scientist (life sciences)  . Smokeless tobacco: Never Used  .  Alcohol use No  . Drug use: No  . Sexual activity: Not on file   Other Topics Concern  . Not on file   Social History Narrative  . No narrative on file    Family History  Problem Relation Age of Onset  . CAD Neg Hx   . Hypertension Neg Hx   . Diabetes Neg Hx     Past Surgical History:  Procedure Laterality Date  . CORONARY ARTERY BYPASS GRAFT N/A 03/23/2017   Procedure: CORONARY ARTERY BYPASS GRAFTING (CABG) x , four using left internal mammary artery and right greater saphenous vein harvested endoscopically;  Surgeon: Ivin Poot, MD;  Location: [facility];  Service: Open Heart Surgery;  Laterality: N/A;  . ENDOVEIN HARVEST OF GREATER  SAPHENOUS VEIN Right 03/23/2017   Procedure: ENDOVEIN HARVEST OF GREATER SAPHENOUS VEIN;  Surgeon: Ivin Poot, MD;  Location: [facility];  Service: Open Heart Surgery;  Laterality: Right;  . LEFT HEART CATH AND CORONARY ANGIOGRAPHY Left 03/16/2017   Procedure: Left Heart Cath and Coronary Angiography;  Surgeon: Isaias Cowman, MD;  Location: [facility];  Service: Cardiovascular;  Laterality: Left;  . none    . RIGHT HEART CATH N/A 03/21/2017   Procedure: Right Heart Cath;  Surgeon: Larey Dresser, MD;  Location: [facility];  Service: Cardiovascular;  Laterality: N/A;  . TEE WITHOUT CARDIOVERSION N/A 03/23/2017   Procedure: TRANSESOPHAGEAL ECHOCARDIOGRAM (TEE);  Surgeon: Prescott Gum, Collier Salina, MD;  Location: [facility];  Service: Open Heart Surgery;  Laterality: N/A;    ROS: Review of Systems Negative except as stated above PHYSICAL EXAM: BP 100/67   Pulse 95   Temp 98.2 F (36.8 C) (Oral)   Resp 16   Wt 169 lb 9.6 oz (76.9 kg)   SpO2 97%   BMI 26.56 kg/m   Wt Readings from Last 3 Encounters:  06/07/17 169 lb 9.6 oz (76.9 kg)  04/25/17 159 lb (72.1 kg)  04/02/17 160 lb (72.6 kg)   Physical Exam General appearance - alert, well appearing, middle age male and in no distress Mental status - alert, oriented to person, place, and time, normal mood, behavior, speech, dress, motor activity, and thought processes Neck - supple, no significant adenopathy Chest - clear to auscultation, no wheezes, rales or rhonchi, symmetric air entry Heart - normal rate, regular rhythm, normal S1, S2, no murmurs, rubs, clicks or gallops Extremities - peripheral pulses normal, no pedal edema, no clubbing or cyanosis Diabetic Foot Exam - Simple   Simple Foot Form Visual Inspection No deformities, no ulcerations, no other skin breakdown bilaterally:  Yes Sensation Testing See comments:  Yes Pulse Check Posterior Tibialis and Dorsalis pulse intact bilaterally:  Yes Comments Decrease  sensation on dorsum and soles of both feet on leap exam     Lab Results  Component Value Date   WBC 8.6 03/30/2017   HGB 8.2 (L) 03/30/2017   HCT 25.4 (L) 03/30/2017   MCV 87.3 03/30/2017   PLT 293 03/30/2017     Chemistry      Component Value Date/Time   NA 136 03/30/2017 0437   NA 136 04/18/2016 1013   K 4.0 03/30/2017 0437   K 4.6 04/18/2016 1013   CL 102 03/30/2017 0437   CL 104 09/30/2012 0908   CO2 28 03/30/2017 0437   CO2 24 04/18/2016 1013   BUN 10 03/30/2017 0437   BUN 15.9 04/18/2016 1013   CREATININE 0.72 03/30/2017 0437   CREATININE 1.0  04/18/2016 1013      Component Value Date/Time   CALCIUM 8.7 (L) 03/30/2017 0437   CALCIUM 9.4 04/18/2016 1013   ALKPHOS 53 03/21/2017 0248   ALKPHOS 43 04/18/2016 1013   AST 62 (H) 03/21/2017 0248   AST 17 04/18/2016 1013   ALT 90 (H) 03/21/2017 0248   ALT 26 04/18/2016 1013   BILITOT 0.5 03/21/2017 0248   BILITOT 0.39 04/18/2016 1013       Results for orders placed or performed in visit on 06/07/17  POCT glucose (manual entry)  Result Value Ref Range   POC Glucose 235 (A) 70 - 99 mg/dl  POCT glycosylated hemoglobin (Hb A1C)  Result Value Ref Range   Hemoglobin A1C 12.1     ASSESSMENT AND PLAN: 1. Type 2 diabetes mellitus with diabetic neuropathy, with long-term current use of insulin (HCC) -change Levemir and Humalog to Lantus and Novolog due to our formulary.  Dose of long-acting insulin increased to 68 units daily and short acting insulin to 13 units 3 times a day with meals -Add gabapentin at bedtime for neuropathy symptoms -advise to get eye exam done at [facility] - POCT glucose (manual entry) - POCT glycosylated hemoglobin (Hb A1C) - insulin detemir (LEVEMIR) 100 UNIT/ML injection; Inject 0.68 mLs (68 Units total) into the skin at bedtime.  Dispense: 10 mL; Refill: 3 - insulin aspart (NOVOLOG) 100 UNIT/ML injection; Inject 13 Units into the skin 3 (three) times daily before meals.  Dispense: 10 mL; Refill:  11 - Comprehensive metabolic panel - CBC - Microalbumin / creatinine urine ratio - gabapentin (NEURONTIN) 300 MG capsule; Take 1 capsule (300 mg total) by mouth at bedtime.  Dispense: 90 capsule; Refill: 3  2. Coronary artery disease involving native coronary artery of native heart without angina pectoris 3. Ischemic cardiomyopathy -should be on Coreg, ASA and statin. Rxns sent to pharmacy -keep appt with cardiology next mth - atorvastatin (LIPITOR) 80 MG tablet; Take 1 tablet (80 mg total) by mouth daily at 6 PM.  Dispense: 30 tablet; Refill: 6  4. Anemia, unspecified type -recheck CBC today  5. Need for Streptococcus pneumoniae and influenza vaccination - Pneumococcal polysaccharide vaccine 23-valent greater than or equal to 2yo subcutaneous/IM - Flu Vaccine QUAD 6+ mos PF IM (Fluarix Quad PF)  6. Hyperlipidemia, unspecified hyperlipidemia type   7. Need for hepatitis C screening test - Hepatitis c antibody (reflex)  8. Hypothyroidism, unspecified type - TSH+T4F+T3Free   Patient was given the opportunity to ask questions.  Patient verbalized understanding of the plan and was able to repeat key elements of the plan.  [employer] interpreter used during this encounter.# [id number]  Orders Placed This Encounter  Procedures  . Pneumococcal polysaccharide vaccine 23-valent greater than or equal to 2yo subcutaneous/IM  . Flu Vaccine QUAD 6+ mos PF IM (Fluarix Quad PF)  . Comprehensive metabolic panel  . CBC  . Microalbumin / creatinine urine ratio  . Hepatitis c antibody (reflex)  . TSH+T4F+T3Free  . POCT glucose (manual entry)  . POCT glycosylated hemoglobin (Hb A1C)     Requested Prescriptions   Signed Prescriptions Disp Refills  . atorvastatin (LIPITOR) 80 MG tablet 30 tablet 6    Sig: Take 1 tablet (80 mg total) by mouth daily at 6 PM.  . insulin detemir (LEVEMIR) 100 UNIT/ML injection 10 mL 3    Sig: Inject 0.68 mLs (68 Units total) into the skin at bedtime.  . insulin  aspart (NOVOLOG) 100 UNIT/ML injection 10 mL 11  Sig: Inject 13 Units into the skin 3 (three) times daily before meals.  . gabapentin (NEURONTIN) 300 MG capsule 90 capsule 3    Sig: Take 1 capsule (300 mg total) by mouth at bedtime.    Return in about 2 months (around 08/07/2017).  Karle Plumber, MD, FACP

## 2017-06-07 NOTE — Patient Instructions (Addendum)
Increase Lantus to 68 units daily and Novolog to  13 units three times a day with meals.   Pneumococcal Polysaccharide Vaccine: What You Need to Know 1. Why get vaccinated? Vaccination can protect older adults (and some children and younger adults) from pneumococcal disease. Pneumococcal disease is caused by bacteria that can spread from person to person through close contact. It can cause ear infections, and it can also lead to more serious infections of the:  Lungs (pneumonia),  Blood (bacteremia), and  Covering of the brain and spinal cord (meningitis). Meningitis can cause deafness and brain damage, and it can be fatal.  Anyone can get pneumococcal disease, but children under 82 years of age, people with certain medical conditions, adults over 59 years of age, and cigarette smokers are at the highest risk. About 18,000 older adults die each year from pneumococcal disease in the [place]. Treatment of pneumococcal infections with penicillin and other drugs used to be more effective. But some strains of the disease have become resistant to these drugs. This makes prevention of the disease, through vaccination, even more important. 2. Pneumococcal polysaccharide vaccine (PPSV23) Pneumococcal polysaccharide vaccine (PPSV23) protects against 23 types of pneumococcal bacteria. It will not prevent all pneumococcal disease. PPSV23 is recommended for:  All adults 61 years of age and older,  Anyone 2 through 56 years of age with certain long-term health problems,  Anyone 2 through 56 years of age with a weakened immune system,  Adults 42 through 57 years of age who smoke cigarettes or have asthma.  Most people need only one dose of PPSV. A second dose is recommended for certain high-risk groups. People 58 and older should get a dose even if they have gotten one or more doses of the vaccine before they turned 65. Your healthcare provider can give you more information about these  recommendations. Most healthy adults develop protection within 2 to 3 weeks of getting the shot. 3. Some people should not get this vaccine  Anyone who has had a life-threatening allergic reaction to PPSV should not get another dose.  Anyone who has a severe allergy to any component of PPSV should not receive it. Tell your provider if you have any severe allergies.  Anyone who is moderately or severely ill when the shot is scheduled may be asked to wait until they recover before getting the vaccine. Someone with a mild illness can usually be vaccinated.  Children less than 90 years of age should not receive this vaccine.  There is no evidence that PPSV is harmful to either a pregnant woman or to her fetus. However, as a precaution, women who need the vaccine should be vaccinated before becoming pregnant, if possible. 4. Risks of a vaccine reaction With any medicine, including vaccines, there is a chance of side effects. These are usually mild and go away on their own, but serious reactions are also possible. About half of people who get PPSV have mild side effects, such as redness or pain where the shot is given, which go away within about two days. Less than 1 out of 100 people develop a fever, muscle aches, or more severe local reactions. Problems that could happen after any vaccine:  People sometimes faint after a medical procedure, including vaccination. Sitting or lying down for about 15 minutes can help prevent fainting, and injuries caused by a fall. Tell your doctor if you feel dizzy, or have vision changes or ringing in the ears.  Some people get severe pain  in the shoulder and have difficulty moving the arm where a shot was given. This happens very rarely.  Any medication can cause a severe allergic reaction. Such reactions from a vaccine are very rare, estimated at about 1 in a million doses, and would happen within a few minutes to a few hours after the vaccination. As with any  medicine, there is a very remote chance of a vaccine causing a serious injury or death. The safety of vaccines is always being monitored. For more information, visit: http://www.aguilar.org/ 5. What if there is a serious reaction? What should I look for? Look for anything that concerns you, such as signs of a severe allergic reaction, very high fever, or unusual behavior. Signs of a severe allergic reaction can include hives, swelling of the face and throat, difficulty breathing, a fast heartbeat, dizziness, and weakness. These would usually start a few minutes to a few hours after the vaccination. What should I do? If you think it is a severe allergic reaction or other emergency that can't wait, call 9-1-1 or get to the nearest hospital. Otherwise, call your doctor. Afterward, the reaction should be reported to the Vaccine Adverse Event Reporting System (VAERS). Your doctor might file this report, or you can do it yourself through the VAERS web site at www.vaers.SamedayNews.es, or by calling 231-174-8577. VAERS does not give medical advice. 6. How can I learn more?  Ask your doctor. He or she can give you the vaccine package insert or suggest other sources of information.  Call your local or state health department.  Contact the Centers for Disease Control and Prevention (CDC): ? Call 386-659-3306 (1-800-CDC-INFO) or ? Visit CDC's website at http://hunter.com/ CDC Pneumococcal Polysaccharide Vaccine VIS (12/19/13) This information is not intended to replace advice given to you by your health care provider. Make sure you discuss any questions you have with your health care provider. Document Released: 06/11/2006 Document Revised: 05/04/2016 Document Reviewed: 05/04/2016 Elsevier Interactive Patient Education  2017 [employer].   Influenza Virus Vaccine injection (Fluarix) What is this medicine? INFLUENZA VIRUS VACCINE (in floo EN zuh VAHY ruhs vak SEEN) helps to reduce the risk of  getting influenza also known as the flu. This medicine may be used for other purposes; ask your health care provider or pharmacist if you have questions. COMMON BRAND NAME(S): Fluarix, Fluzone What should I tell my health care provider before I take this medicine? They need to know if you have any of these conditions: -bleeding disorder like hemophilia -fever or infection -Guillain-Barre syndrome or other neurological problems -immune system problems -infection with the human immunodeficiency virus (HIV) or AIDS -low blood platelet counts -multiple sclerosis -an unusual or allergic reaction to influenza virus vaccine, eggs, chicken proteins, latex, gentamicin, other medicines, foods, dyes or preservatives -pregnant or trying to get pregnant -breast-feeding How should I use this medicine? This vaccine is for injection into a muscle. It is given by a health care professional. A copy of Vaccine Information Statements will be given before each vaccination. Read this sheet carefully each time. The sheet may change frequently. Talk to your pediatrician regarding the use of this medicine in children. Special care may be needed. Overdosage: If you think you have taken too much of this medicine contact a poison control center or emergency room at once. NOTE: This medicine is only for you. Do not share this medicine with others. What if I miss a dose? This does not apply. What may interact with this medicine? -chemotherapy or radiation  therapy -medicines that lower your immune system like etanercept, anakinra, infliximab, and adalimumab -medicines that treat or prevent blood clots like warfarin -phenytoin -steroid medicines like prednisone or cortisone -theophylline -vaccines This list may not describe all possible interactions. Give your health care provider a list of all the medicines, herbs, non-prescription drugs, or dietary supplements you use. Also tell them if you smoke, drink alcohol, or  use illegal drugs. Some items may interact with your medicine. What should I watch for while using this medicine? Report any side effects that do not go away within 3 days to your doctor or health care professional. Call your health care provider if any unusual symptoms occur within 6 weeks of receiving this vaccine. You may still catch the flu, but the illness is not usually as bad. You cannot get the flu from the vaccine. The vaccine will not protect against colds or other illnesses that may cause fever. The vaccine is needed every year. What side effects may I notice from receiving this medicine? Side effects that you should report to your doctor or health care professional as soon as possible: -allergic reactions like skin rash, itching or hives, swelling of the face, lips, or tongue Side effects that usually do not require medical attention (report to your doctor or health care professional if they continue or are bothersome): -fever -headache -muscle aches and pains -pain, tenderness, redness, or swelling at site where injected -weak or tired This list may not describe all possible side effects. Call your doctor for medical advice about side effects. You may report side effects to FDA at 1-800-FDA-1088. Where should I keep my medicine? This vaccine is only given in a clinic, pharmacy, doctor's office, or other health care setting and will not be stored at home. NOTE: This sheet is a summary. It may not cover all possible information. If you have questions about this medicine, talk to your doctor, pharmacist, or health care provider.  2018 Elsevier/Gold Standard (2008-03-11 09:30:40)

## 2017-06-08 LAB — COMPREHENSIVE METABOLIC PANEL
ALT: 23 IU/L (ref 0–44)
AST: 10 IU/L (ref 0–40)
Albumin/Globulin Ratio: 1.3 (ref 1.2–2.2)
Albumin: 4.3 g/dL (ref 3.5–5.5)
Alkaline Phosphatase: 63 IU/L (ref 39–117)
BILIRUBIN TOTAL: 0.3 mg/dL (ref 0.0–1.2)
BUN / CREAT RATIO: 20 (ref 9–20)
BUN: 19 mg/dL (ref 6–24)
CALCIUM: 10.2 mg/dL (ref 8.7–10.2)
CO2: 24 mmol/L (ref 20–29)
Chloride: 99 mmol/L (ref 96–106)
Creatinine, Ser: 0.97 mg/dL (ref 0.76–1.27)
GFR, EST AFRICAN AMERICAN: 100 mL/min/{1.73_m2} (ref >59)
GFR, EST NON AFRICAN AMERICAN: 87 mL/min/{1.73_m2} (ref >59)
GLOBULIN, TOTAL: 3.2 g/dL (ref 1.5–4.5)
Glucose: 276 mg/dL — ABNORMAL HIGH (ref 65–99)
POTASSIUM: 4.3 mmol/L (ref 3.5–5.2)
SODIUM: 138 mmol/L (ref 134–144)
TOTAL PROTEIN: 7.5 g/dL (ref 6.0–8.5)

## 2017-06-08 LAB — HCV COMMENT:

## 2017-06-08 LAB — CBC
Hematocrit: 41.9 % (ref 37.5–51.0)
Hemoglobin: 13.2 g/dL (ref 13.0–17.7)
MCH: 25.8 pg — AB (ref 26.6–33.0)
MCHC: 31.5 g/dL (ref 31.5–35.7)
MCV: 82 fL (ref 79–97)
PLATELETS: 194 10*3/uL (ref 150–379)
RBC: 5.11 x10E6/uL (ref 4.14–5.80)
RDW: 16.2 % — AB (ref 12.3–15.4)
WBC: 9.2 10*3/uL (ref 3.4–10.8)

## 2017-06-08 LAB — TSH+T4F+T3FREE
Free T4: 1.92 ng/dL — ABNORMAL HIGH (ref 0.82–1.77)
T3, Free: 4.9 pg/mL — ABNORMAL HIGH (ref 2.0–4.4)
TSH: 1.42 u[IU]/mL (ref 0.450–4.500)

## 2017-06-08 LAB — HEPATITIS C ANTIBODY (REFLEX)

## 2017-06-09 ENCOUNTER — Telehealth: Payer: Self-pay | Admitting: Internal Medicine

## 2017-06-09 DIAGNOSIS — Z8639 Personal history of other endocrine, nutritional and metabolic disease: Secondary | ICD-10-CM

## 2017-06-09 NOTE — Telephone Encounter (Signed)
Pt with normal TSH but mildly elevated free T3/T4. Ddx include TSH secreting adenoma vs residual effects from Amiodarone which was d/c several weeks ago.  Will recheck levels in about 8 wks.

## 2017-06-20 ENCOUNTER — Encounter: Payer: Self-pay | Admitting: *Deleted

## 2017-06-20 ENCOUNTER — Telehealth: Payer: Self-pay

## 2017-06-20 NOTE — Telephone Encounter (Signed)
This is an Sunday Corn from [facility] contacted the office to get verbal order to prescribed pt amoxicillin 500mg  while on his other mediciation. Per Dr. Doreene Burke it is okay to prescribed pt the Amox.

## 2017-07-05 MED FILL — !NOVOLOG 100UNITS/ML VIAL: 100/ML | 25 days supply | Qty: 10 | Fill #1

## 2017-07-06 MED FILL — !LEVEMIR 100 UNITS/ML VIAL: 100/ML | 29 days supply | Qty: 20 | Fill #1

## 2017-07-18 ENCOUNTER — Other Ambulatory Visit: Payer: Self-pay | Admitting: *Deleted

## 2017-07-18 ENCOUNTER — Other Ambulatory Visit: Payer: Self-pay | Admitting: Pharmacist

## 2017-07-18 ENCOUNTER — Ambulatory Visit: Payer: Medicaid Other | Attending: Internal Medicine | Admitting: *Deleted

## 2017-07-18 DIAGNOSIS — Z0189 Encounter for other specified special examinations: Secondary | ICD-10-CM | POA: Diagnosis not present

## 2017-07-18 DIAGNOSIS — E1140 Type 2 diabetes mellitus with diabetic neuropathy, unspecified: Secondary | ICD-10-CM

## 2017-07-18 DIAGNOSIS — Z794 Long term (current) use of insulin: Secondary | ICD-10-CM

## 2017-07-18 MED ORDER — TRUE METRIX METER W/DEVICE KIT: 1.0000 [IU] | PACK | Freq: Three times a day (TID) | 0 refills | Status: DC | PRN

## 2017-07-18 MED ORDER — GLUCOSE BLOOD VI STRP: ORAL_STRIP | 12 refills | Status: DC

## 2017-07-18 MED ORDER — INSULIN DETEMIR 100 UNIT/ML ~~LOC~~ SOLN: 68.0000 [IU] | Freq: Every day | SUBCUTANEOUS | 0 refills | Status: DC

## 2017-07-18 MED ORDER — TRUEPLUS LANCETS 28G MISC: 28.0000 g | Freq: Three times a day (TID) | 3 refills | Status: DC

## 2017-07-18 MED FILL — TRUEplus LANCETS 28G MISC: 30 days supply | Qty: 100 | Fill #0

## 2017-07-18 MED FILL — GABAPENTIN 300 MG CAPSULE: 300 | 30 days supply | Qty: 30 | Fill #1

## 2017-07-18 MED FILL — ATORVASTATIN 80 MG TABLET: 80 | 30 days supply | Qty: 30 | Fill #1

## 2017-07-18 MED FILL — !TRUE METRIX BLOOD GLUCOSE: 1 days supply | Qty: 1 | Fill #0

## 2017-07-18 MED FILL — !LEVEMIR 100 UNITS/ML VIAL: 100/ML | 14 days supply | Qty: 10 | Fill #0

## 2017-07-18 MED FILL — TRUE METRIX TEST STRIP: 30 days supply | Qty: 100 | Fill #0

## 2017-07-18 NOTE — Progress Notes (Signed)
Patient Triage Assessment Form Todays Date: 07/18/2017 Name:  George Griffin DOB: 07-Jan-1961   Pt came to pick up medication from [facility]. Pharmacy noticed  patient has taken too much insulin and not taking as directed.  CBG- 396 Education provided to patient how to take medication; correct sites and dosage. Sent glucometer to pharmacy. Pt verbalized understanding to information provided. Interpretation provided by [facility].  Assessment Vital Signs:  T: P: R: SpO2 BP:  Plan Scheduled appointment with Clinical Pharmacist for next week : Tuesday 07/26/17 at 1030.

## 2017-07-24 ENCOUNTER — Encounter: Payer: Self-pay | Admitting: Pharmacist

## 2017-07-24 ENCOUNTER — Ambulatory Visit: Payer: Medicaid Other | Attending: Internal Medicine | Admitting: Pharmacist

## 2017-07-24 DIAGNOSIS — Z7689 Persons encountering health services in other specified circumstances: Secondary | ICD-10-CM | POA: Diagnosis present

## 2017-07-24 DIAGNOSIS — E1140 Type 2 diabetes mellitus with diabetic neuropathy, unspecified: Secondary | ICD-10-CM | POA: Diagnosis not present

## 2017-07-24 DIAGNOSIS — R351 Nocturia: Secondary | ICD-10-CM | POA: Insufficient documentation

## 2017-07-24 DIAGNOSIS — Z794 Long term (current) use of insulin: Secondary | ICD-10-CM | POA: Diagnosis not present

## 2017-07-24 LAB — GLUCOSE, POCT (MANUAL RESULT ENTRY): POC GLUCOSE: 325 mg/dL — AB (ref 70–99)

## 2017-07-24 MED ORDER — INSULIN DETEMIR 100 UNIT/ML ~~LOC~~ SOLN: 40.0000 [IU] | Freq: Two times a day (BID) | SUBCUTANEOUS | 2 refills | Status: DC

## 2017-07-24 MED ORDER — INSULIN GLARGINE 100 UNIT/ML ~~LOC~~ SOLN: 40.0000 [IU] | Freq: Two times a day (BID) | SUBCUTANEOUS | 2 refills | Status: DC

## 2017-07-24 MED FILL — !LANTUS 100 UNITS/ML VIAL: 100 | 37 days supply | Qty: 30 | Fill #0

## 2017-07-24 NOTE — Patient Instructions (Addendum)
Thank you for coming to see me  Start taking Levemir 40 units twice a day  Follow up with Dr. Wynetta Emery in 2 weeks

## 2017-07-24 NOTE — Progress Notes (Signed)
    S:     Chief Complaint  Patient presents with  . Medication Management    Patient arrives in good spirits.  Presents for diabetes evaluation, education, and management at the request of Dr. Wynetta Emery. Patient was referred on 07/18/17.  Patient was last seen by Primary Care Provider on 06/07/17. [employer] interpreter [place] was used for the entirety of the visit.  Patient was referred to me after trying to pick up his insulin too early from the pharmacy. He had been taking Levemir 68 units TID but was unlikely getting the full amount of insulin due to poor injection technique. Correct dose and injection technique was reviewed with him at visit with Carilyn Goodpasture, RN.   Patient reports adherence with medications.  Current diabetes medications include: Levemir 68 units daily, Novolog 13 units BID  He reports that when he takes Levemir 68 units TID he gets blood sugars in the 140s fasting. When he takes it BID, he gets 170s. When he takes it daily, as he has been since he met with Carilyn Goodpasture, he has been getting blood sugars in the 300s.  Patient denies hypoglycemic events.   Patient reports nocturia.  Patient reports neuropathy. Patient reports visual changes. Patient reports self foot exams.    O:  Physical Exam   ROS   Lab Results  Component Value Date   HGBA1C 12.1 06/07/2017   There were no vitals filed for this visit.  Home fasting CBG: 300s 2 hour post-prandial/random CBG: 300s-400s  POCT glucose = 325  A/P: Diabetes longstanding currently uncontrolled with A1c of 12.1. Patient denies hypoglycemic events and is able to verbalize appropriate hypoglycemia management plan. Patient reports adherence with medication.   Checked with pharmacy and they would prefer to give patient Lantus. I think patient is on high enough dose that it should be split instead of increasing it more so will order Lantus 40 units BID which should provide better coverage due to best absorption  and increased total dose. Will likely need to titrate up but I don't think he will need anywhere near 68 units TID. Patient instructed to take insulin as we prescribe it and to not change the dose without speaking to someone in our office first. Continue other medications as prescribed. No insulin administered at this visit as patient was going to take Novolog as soon as he left because he was going to eat breakfast.  Next A1C anticipated January 2019.    Written patient instructions provided.  Total time in face to face counseling 30 minutes.   Follow up in Pharmacist Clinic Visit PRN, next visit already scheduled with Dr. Wynetta Emery in 2 weeks.

## 2017-08-07 ENCOUNTER — Ambulatory Visit: Payer: Self-pay | Admitting: Internal Medicine

## 2017-08-11 ENCOUNTER — Other Ambulatory Visit: Payer: Self-pay | Admitting: Internal Medicine

## 2017-08-16 MED FILL — !NOVOLOG 100UNITS/ML VIAL: 100/ML | 25 days supply | Qty: 10 | Fill #2

## 2017-08-16 MED FILL — ATORVASTATIN 80 MG TABLET: 80 | 30 days supply | Qty: 30 | Fill #2

## 2017-08-17 DIAGNOSIS — I739 Peripheral vascular disease, unspecified: Secondary | ICD-10-CM

## 2017-09-04 ENCOUNTER — Ambulatory Visit (HOSPITAL_COMMUNITY)
Admission: RE | Admit: 2017-09-04 | Discharge: 2017-09-04 | Disposition: A | Discharge status: Home / Self Care | Payer: Medicare Other | Source: Ambulatory Visit | Attending: Internal Medicine | Admitting: Internal Medicine

## 2017-09-04 ENCOUNTER — Encounter: Payer: Self-pay | Admitting: Internal Medicine

## 2017-09-04 ENCOUNTER — Ambulatory Visit: Payer: Medicare Other | Attending: Internal Medicine | Admitting: Internal Medicine

## 2017-09-04 VITALS — BP 112/68 | HR 95 | Temp 98.0°F | Resp 16 | Wt 170.0 lb

## 2017-09-04 DIAGNOSIS — I480 Paroxysmal atrial fibrillation: Secondary | ICD-10-CM | POA: Diagnosis not present

## 2017-09-04 DIAGNOSIS — Z951 Presence of aortocoronary bypass graft: Secondary | ICD-10-CM | POA: Insufficient documentation

## 2017-09-04 DIAGNOSIS — M25511 Pain in right shoulder: Secondary | ICD-10-CM | POA: Insufficient documentation

## 2017-09-04 DIAGNOSIS — Z794 Long term (current) use of insulin: Secondary | ICD-10-CM | POA: Diagnosis not present

## 2017-09-04 DIAGNOSIS — I2510 Atherosclerotic heart disease of native coronary artery without angina pectoris: Secondary | ICD-10-CM | POA: Insufficient documentation

## 2017-09-04 DIAGNOSIS — Z79899 Other long term (current) drug therapy: Secondary | ICD-10-CM | POA: Insufficient documentation

## 2017-09-04 DIAGNOSIS — I255 Ischemic cardiomyopathy: Secondary | ICD-10-CM | POA: Diagnosis not present

## 2017-09-04 DIAGNOSIS — I739 Peripheral vascular disease, unspecified: Secondary | ICD-10-CM

## 2017-09-04 DIAGNOSIS — Z7982 Long term (current) use of aspirin: Secondary | ICD-10-CM | POA: Insufficient documentation

## 2017-09-04 DIAGNOSIS — E1151 Type 2 diabetes mellitus with diabetic peripheral angiopathy without gangrene: Secondary | ICD-10-CM | POA: Insufficient documentation

## 2017-09-04 DIAGNOSIS — Z87891 Personal history of nicotine dependence: Secondary | ICD-10-CM | POA: Insufficient documentation

## 2017-09-04 DIAGNOSIS — E785 Hyperlipidemia, unspecified: Secondary | ICD-10-CM | POA: Diagnosis not present

## 2017-09-04 DIAGNOSIS — Z85118 Personal history of other malignant neoplasm of bronchus and lung: Secondary | ICD-10-CM | POA: Insufficient documentation

## 2017-09-04 DIAGNOSIS — E1140 Type 2 diabetes mellitus with diabetic neuropathy, unspecified: Secondary | ICD-10-CM | POA: Insufficient documentation

## 2017-09-04 DIAGNOSIS — Z8249 Family history of ischemic heart disease and other diseases of the circulatory system: Secondary | ICD-10-CM | POA: Insufficient documentation

## 2017-09-04 DIAGNOSIS — I10 Essential (primary) hypertension: Secondary | ICD-10-CM | POA: Insufficient documentation

## 2017-09-04 DIAGNOSIS — E039 Hypothyroidism, unspecified: Secondary | ICD-10-CM | POA: Diagnosis not present

## 2017-09-04 DIAGNOSIS — Z8639 Personal history of other endocrine, nutritional and metabolic disease: Secondary | ICD-10-CM | POA: Diagnosis not present

## 2017-09-04 DIAGNOSIS — M19011 Primary osteoarthritis, right shoulder: Secondary | ICD-10-CM | POA: Diagnosis not present

## 2017-09-04 LAB — GLUCOSE, POCT (MANUAL RESULT ENTRY): POC GLUCOSE: 350 mg/dL — AB (ref 70–99)

## 2017-09-04 LAB — POCT GLYCOSYLATED HEMOGLOBIN (HGB A1C): Hemoglobin A1C: 13.5

## 2017-09-04 MED ORDER — INSULIN ASPART 100 UNIT/ML ~~LOC~~ SOLN: 16.0000 [IU] | Freq: Three times a day (TID) | SUBCUTANEOUS | 11 refills | Status: DC

## 2017-09-04 MED ORDER — INSULIN GLARGINE 100 UNIT/ML ~~LOC~~ SOLN: 45.0000 [IU] | Freq: Two times a day (BID) | SUBCUTANEOUS | 2 refills | Status: DC

## 2017-09-04 MED ORDER — ATORVASTATIN CALCIUM 80 MG PO TABS: 40.0000 mg | ORAL_TABLET | Freq: Every day | ORAL | 6 refills | Status: DC

## 2017-09-04 MED FILL — ATORVASTATIN 80 MG TABLET: 80 | 30 days supply | Qty: 15 | Fill #0

## 2017-09-04 MED FILL — !NOVOLOG 100UNITS/ML VIAL: 100/ML | 20 days supply | Qty: 10 | Fill #0

## 2017-09-04 MED FILL — $LANTUS 100 UNITS/ML VIAL: 100 | 33 days supply | Qty: 30 | Fill #0

## 2017-09-04 MED FILL — ?CARVEDILOL 12.5 MG TABLET: 12.5 | 30 days supply | Qty: 60 | Fill #3

## 2017-09-04 MED FILL — GABAPENTIN 300 MG CAPSULE: 300 | 30 days supply | Qty: 30 | Fill #2

## 2017-09-04 NOTE — Progress Notes (Signed)
Patient ID: George Griffin, male    DOB: 13-Nov-1960  MRN: [id number]  CC: Diabetes   Subjective: George Griffin is a 57 y.o. male who presents for chronic ds management. His concerns today include:  Hx of DM with neuropathy, hypothyroid, lung CA s/p chem/XRT HL, HTN, CAD/ICM s/p CABG x 4 with EF 30-35%, PAF  1.  Aching in neck, RT shoulder and BL calf x 1.5 mths -worse at nights, not bad during the day -can walk about a block before he has to stop and rest due to aching in calfs -on Lipitor 80 mg -discomfort over surg incisional from CABG with flex/ext neck  2. DM:  Checks BS most days. Has glucometer with him. Most readings in 200-300 Reports compliance with Lantus 40 unis BID and Novolog 13 units TID with meals -had eye exam 2 mths ago  3. CAD: saw cardiologist since last visit with me.  Denies any chest pain with exertion. . Compliant with meds.  Patient Active Problem List   Diagnosis Date Noted  . S/P CABG x 4 03/23/2017  . Coronary artery disease involving native coronary artery of native heart with angina pectoris ([place]) 03/17/2017  . Ischemic cardiomyopathy 03/17/2017  . Non-STEMI (non-ST elevated myocardial infarction) ([place]) 03/16/2017  . Acquired hypothyroidism 10/05/2015  . Poor social situation 10/05/2015  . Weight loss, unintentional 10/05/2015  . Homeless 04/06/2014  . History of lung cancer 08/08/2011  . DM2 (diabetes mellitus, type 2) ([place]) 08/03/2011  . Hyperlipidemia with target low density lipoprotein (LDL) cholesterol less than 70 mg/dL   . Diabetic neuropathy, type II diabetes mellitus ([place])   . Hepatic steatosis      Current Outpatient Medications on File Prior to Visit  Medication Sig Dispense Refill  . aspirin EC 325 MG EC tablet Take 1 tablet (325 mg total) by mouth daily. 30 tablet 0  . Blood Glucose Monitoring Suppl (TRUE METRIX METER) w/Device KIT 1 Units by Does not apply route 3 (three) times daily as needed. 1 kit 0  . carvedilol (COREG) 12.5 MG  tablet Take 1 tablet (12.5 mg total) by mouth 2 (two) times daily with a meal. 60 tablet 3  . digoxin (LANOXIN) 0.25 MG tablet Take 1 tablet (0.25 mg total) by mouth daily. 30 tablet 3  . furosemide (LASIX) 40 MG tablet Take 1 tablet (40 mg total) by mouth daily. 30 tablet 3  . gabapentin (NEURONTIN) 300 MG capsule Take 1 capsule (300 mg total) by mouth at bedtime. 90 capsule 3  . glucose blood (TRUE METRIX BLOOD GLUCOSE TEST) test strip Use as instructed 100 each 12  . potassium chloride SA (K-DUR,KLOR-CON) 20 MEQ tablet Take 1 tablet (20 mEq total) by mouth daily. 30 tablet 1  . TRUEPLUS LANCETS 28G MISC 28 g by Does not apply route 3 (three) times daily. 100 each 3   No current facility-administered medications on file prior to visit.     Allergies  Allergen Reactions  . No Known Allergies     Social History   Socioeconomic History  . Marital status: Married    Spouse name: Not on file  . Number of children: Not on file  . Years of education: Not on file  . Highest education level: Not on file  Social Needs  . Financial resource strain: Not on file  . Food insecurity - worry: Not on file  . Food insecurity - inability: Not on file  . Transportation needs - medical: Not on file  .  Transportation needs - non-medical: Not on file  Occupational History    Employer: UNEMPLOYED  Tobacco Use  . Smoking status: Former Research scientist (life sciences)  . Smokeless tobacco: Never Used  Substance and Sexual Activity  . Alcohol use: No  . Drug use: No  . Sexual activity: Not on file  Other Topics Concern  . Not on file  Social History Narrative  . Not on file    Family History  Problem Relation Age of Onset  . CAD Neg Hx   . Hypertension Neg Hx   . Diabetes Neg Hx     Past Surgical History:  Procedure Laterality Date  . CORONARY ARTERY BYPASS GRAFT N/A 03/23/2017   Procedure: CORONARY ARTERY BYPASS GRAFTING (CABG) x , four using left internal mammary artery and right greater saphenous vein  harvested endoscopically;  Surgeon: Ivin Poot, MD;  Location: [facility];  Service: Open Heart Surgery;  Laterality: N/A;  . ENDOVEIN HARVEST OF GREATER SAPHENOUS VEIN Right 03/23/2017   Procedure: ENDOVEIN HARVEST OF GREATER SAPHENOUS VEIN;  Surgeon: Ivin Poot, MD;  Location: [facility];  Service: Open Heart Surgery;  Laterality: Right;  . LEFT HEART CATH AND CORONARY ANGIOGRAPHY Left 03/16/2017   Procedure: Left Heart Cath and Coronary Angiography;  Surgeon: Isaias Cowman, MD;  Location: [facility];  Service: Cardiovascular;  Laterality: Left;  . none    . RIGHT HEART CATH N/A 03/21/2017   Procedure: Right Heart Cath;  Surgeon: Larey Dresser, MD;  Location: [facility];  Service: Cardiovascular;  Laterality: N/A;  . TEE WITHOUT CARDIOVERSION N/A 03/23/2017   Procedure: TRANSESOPHAGEAL ECHOCARDIOGRAM (TEE);  Surgeon: Prescott Gum, Collier Salina, MD;  Location: [facility];  Service: Open Heart Surgery;  Laterality: N/A;    ROS: Review of Systems Negative except as stated above. PHYSICAL EXAM: BP 112/68   Pulse 95   Temp 98 F (36.7 C) (Oral)   Resp 16   Wt 170 lb (77.1 kg)   SpO2 99%   BMI 26.63 kg/m   Physical Exam  General appearance - alert, well appearing, and in no distress Mental status - alert, oriented to person, place, and time, normal mood, behavior, speech, dress, motor activity, and thought processes Neck - supple, no significant adenopathy Chest - clear to auscultation, no wheezes, rales or rhonchi, symmetric air entry Heart - normal rate, regular rhythm, normal S1, S2, no murmurs, rubs, clicks or gallops Musculoskeletal -RT shoulder: mild discomfort with passive ROM in all directions. LT shoulder: good ROM Neck -good ROM. Some discomfort over healedsurgical incision with flexion/extension Extremities -no LE edema. Good DP, PT popliteal and femoral pulses ABI 1.34 LT/1.29 RT  Results for orders placed or performed in visit on 09/04/17  POCT glucose  (manual entry)  Result Value Ref Range   POC Glucose 350 (A) 70 - 99 mg/dl  POCT glycosylated hemoglobin (Hb A1C)  Result Value Ref Range   Hemoglobin A1C 13.5     Lab Results  Component Value Date   TSH 1.420 06/07/2017    ASSESSMENT AND PLAN: 1. Type 2 diabetes mellitus with diabetic neuropathy, with long-term current use of insulin (HCC) Uncontrolled Increase Lantus to 45 units twice a day and NovoLog to 16 units with meals Continue to keep a log and bring with him on next visit - POCT glucose (manual entry) - POCT glycosylated hemoglobin (Hb A1C) - Microalbumin / creatinine urine ratio - insulin aspart (NOVOLOG) 100 UNIT/ML injection; Inject 16 Units into the skin 3 (three)  times daily before meals.  Dispense: 20 mL; Refill: 11 - insulin glargine (LANTUS) 100 UNIT/ML injection; Inject 0.45 mLs (45 Units total) into the skin 2 (two) times daily.  Dispense: 30 mL; Refill: 2  2. Claudication of calf muscles (HCC) ABIs were good on screening today.  We will check a CK level.  In the meantime I have recommended decrease Lipitor from 80 mg daily to 40 mg daily - POCT ABI Screening Pilot No Charge - CK  3. Acute pain of right shoulder - DG Shoulder Right; Future  4. History of hypothyroidism - TSH+T4F+T3Free  5. Coronary artery disease involving native coronary artery of native heart without angina pectoris Continue Lipitor, aspirin, carvedilol and digoxin - atorvastatin (LIPITOR) 80 MG tablet; Take 0.5 tablets (40 mg total) by mouth daily at 6 PM.  Dispense: 30 tablet; Refill: 6   Patient was given the opportunity to ask questions.  Patient verbalized understanding of the plan and was able to repeat key elements of the plan.  [employer] interpreter used during this encounter.  Orders Placed This Encounter  Procedures  . DG Shoulder Right  . Microalbumin / creatinine urine ratio  . TSH+T4F+T3Free  . CK  . POCT glucose (manual entry)  . POCT glycosylated hemoglobin (Hb A1C)   . POCT ABI Screening Pilot No Charge     Requested Prescriptions   Signed Prescriptions Disp Refills  . insulin aspart (NOVOLOG) 100 UNIT/ML injection 20 mL 11    Sig: Inject 16 Units into the skin 3 (three) times daily before meals.  . insulin glargine (LANTUS) 100 UNIT/ML injection 30 mL 2    Sig: Inject 0.45 mLs (45 Units total) into the skin 2 (two) times daily.  Marland Kitchen atorvastatin (LIPITOR) 80 MG tablet 30 tablet 6    Sig: Take 0.5 tablets (40 mg total) by mouth daily at 6 PM.    Return in about 6 weeks (around 10/16/2017).  Karle Plumber, MD, FACP

## 2017-09-05 ENCOUNTER — Other Ambulatory Visit: Payer: Self-pay | Admitting: Internal Medicine

## 2017-09-05 ENCOUNTER — Encounter: Payer: Self-pay | Admitting: Internal Medicine

## 2017-09-05 DIAGNOSIS — R809 Proteinuria, unspecified: Secondary | ICD-10-CM | POA: Insufficient documentation

## 2017-09-05 LAB — TSH+T4F+T3FREE
Free T4: 1.71 ng/dL (ref 0.82–1.77)
T3, Free: 3.8 pg/mL (ref 2.0–4.4)
TSH: 1.02 u[IU]/mL (ref 0.450–4.500)

## 2017-09-05 LAB — MICROALBUMIN / CREATININE URINE RATIO
Creatinine, Urine: 67.7 mg/dL
MICROALB/CREAT RATIO: 147.7 mg/g{creat} — AB (ref 0.0–30.0)
MICROALBUM., U, RANDOM: 100 ug/mL

## 2017-09-05 MED ORDER — LISINOPRIL 5 MG PO TABS: 2.5000 mg | ORAL_TABLET | Freq: Every day | ORAL | 3 refills | Status: DC

## 2017-09-05 NOTE — Progress Notes (Signed)
Thyroid level has normalized. X-ray of RT shoulder revealed OA changes. Tylenol OTC recommended.

## 2017-09-06 LAB — CK: CK TOTAL: 184 U/L (ref 24–204)

## 2017-09-06 LAB — SPECIMEN STATUS REPORT

## 2017-10-09 MED FILL — NovoLOG 100 UNIT/ML SOLN: 100 | 20 days supply | Qty: 10 | Fill #1

## 2017-10-09 MED FILL — GABAPENTIN 300 MG CAPSULE: 300 | 30 days supply | Qty: 30 | Fill #3

## 2017-10-09 MED FILL — LANTUS 100 UNITS/ML VIAL: 100 | 33 days supply | Qty: 30 | Fill #1

## 2017-10-11 MED FILL — ATORVASTATIN 80 MG TABLET: 80 | 30 days supply | Qty: 15 | Fill #1

## 2017-10-18 ENCOUNTER — Ambulatory Visit: Payer: Medicaid Other | Admitting: Internal Medicine

## 2017-10-19 ENCOUNTER — Other Ambulatory Visit: Payer: Self-pay | Admitting: Pharmacist

## 2017-10-19 MED ORDER — CARVEDILOL 12.5 MG PO TABS: 12.5000 mg | ORAL_TABLET | Freq: Two times a day (BID) | ORAL | 2 refills | Status: DC

## 2017-10-19 MED FILL — CARVEDILOL 12.5 MG TABLET: 12.5 | 30 days supply | Qty: 60 | Fill #0

## 2017-10-23 ENCOUNTER — Encounter: Payer: Self-pay | Admitting: Internal Medicine

## 2017-10-23 ENCOUNTER — Other Ambulatory Visit: Payer: Self-pay

## 2017-10-23 ENCOUNTER — Ambulatory Visit: Payer: Medicare Other | Attending: Internal Medicine | Admitting: Internal Medicine

## 2017-10-23 VITALS — BP 115/75 | HR 102 | Temp 98.6°F | Resp 16 | Wt 175.0 lb

## 2017-10-23 DIAGNOSIS — Z85118 Personal history of other malignant neoplasm of bronchus and lung: Secondary | ICD-10-CM | POA: Insufficient documentation

## 2017-10-23 DIAGNOSIS — Z87891 Personal history of nicotine dependence: Secondary | ICD-10-CM | POA: Insufficient documentation

## 2017-10-23 DIAGNOSIS — Z590 Homelessness: Secondary | ICD-10-CM | POA: Insufficient documentation

## 2017-10-23 DIAGNOSIS — Z794 Long term (current) use of insulin: Secondary | ICD-10-CM | POA: Insufficient documentation

## 2017-10-23 DIAGNOSIS — I480 Paroxysmal atrial fibrillation: Secondary | ICD-10-CM | POA: Insufficient documentation

## 2017-10-23 DIAGNOSIS — Z8249 Family history of ischemic heart disease and other diseases of the circulatory system: Secondary | ICD-10-CM | POA: Diagnosis not present

## 2017-10-23 DIAGNOSIS — Z7982 Long term (current) use of aspirin: Secondary | ICD-10-CM | POA: Diagnosis not present

## 2017-10-23 DIAGNOSIS — E1140 Type 2 diabetes mellitus with diabetic neuropathy, unspecified: Secondary | ICD-10-CM | POA: Diagnosis not present

## 2017-10-23 DIAGNOSIS — E039 Hypothyroidism, unspecified: Secondary | ICD-10-CM | POA: Diagnosis not present

## 2017-10-23 DIAGNOSIS — Z79899 Other long term (current) drug therapy: Secondary | ICD-10-CM | POA: Diagnosis not present

## 2017-10-23 DIAGNOSIS — K760 Fatty (change of) liver, not elsewhere classified: Secondary | ICD-10-CM | POA: Insufficient documentation

## 2017-10-23 DIAGNOSIS — I252 Old myocardial infarction: Secondary | ICD-10-CM | POA: Insufficient documentation

## 2017-10-23 DIAGNOSIS — Z9221 Personal history of antineoplastic chemotherapy: Secondary | ICD-10-CM | POA: Insufficient documentation

## 2017-10-23 DIAGNOSIS — I10 Essential (primary) hypertension: Secondary | ICD-10-CM | POA: Diagnosis not present

## 2017-10-23 DIAGNOSIS — I25810 Atherosclerosis of coronary artery bypass graft(s) without angina pectoris: Secondary | ICD-10-CM | POA: Diagnosis not present

## 2017-10-23 DIAGNOSIS — I255 Ischemic cardiomyopathy: Secondary | ICD-10-CM | POA: Insufficient documentation

## 2017-10-23 DIAGNOSIS — Z951 Presence of aortocoronary bypass graft: Secondary | ICD-10-CM | POA: Insufficient documentation

## 2017-10-23 DIAGNOSIS — E785 Hyperlipidemia, unspecified: Secondary | ICD-10-CM | POA: Diagnosis not present

## 2017-10-23 LAB — GLUCOSE, POCT (MANUAL RESULT ENTRY): POC Glucose: 330 mg/dl — AB (ref 70–99)

## 2017-10-23 MED ORDER — FUROSEMIDE 20 MG PO TABS: ORAL_TABLET | ORAL | 2 refills | Status: DC

## 2017-10-23 MED ORDER — CARVEDILOL 12.5 MG PO TABS: 12.5000 mg | ORAL_TABLET | Freq: Two times a day (BID) | ORAL | 6 refills | Status: DC

## 2017-10-23 MED ORDER — LISINOPRIL 5 MG PO TABS: 2.5000 mg | ORAL_TABLET | Freq: Every day | ORAL | 3 refills | Status: DC

## 2017-10-23 MED ORDER — DIGOXIN 250 MCG PO TABS: 0.2500 mg | ORAL_TABLET | Freq: Every day | ORAL | 5 refills | Status: DC

## 2017-10-23 MED ORDER — INSULIN GLARGINE 100 UNIT/ML ~~LOC~~ SOLN: 48.0000 [IU] | Freq: Two times a day (BID) | SUBCUTANEOUS | 2 refills | Status: DC

## 2017-10-23 NOTE — Progress Notes (Signed)
Patient ID: George Griffin, male    DOB: 02/25/1961  MRN: [id number]  CC: Follow-up   Subjective: George Griffin is a 57 y.o. male who presents for  1 mth f/u DM, HTN His concerns today include:  Hx of DM with neuropathy, hypothyroid, lung CA s/p chem/XRT HL, HTN, CAD/ICM s/p CABG x 4 with EF 30-35%, PAF  1.  DM:  Lantus and Novolog inc on last visit.  Reports compliance with Lantus 45 units BID and Novolog 13 untis (was suppose to be 16 units) TID with meals -BS range in 180-200 range.  -no low BS Eats rice daily  2.  Out of Digoxin, Furosemide and potassium  x 1 mth No CP, SOB, LE edema.  No significant wgh change Patient Active Problem List   Diagnosis Date Noted  . Microalbuminuria 09/05/2017  . S/P CABG x 4 03/23/2017  . Coronary artery disease involving native coronary artery of native heart with angina pectoris ([place]) 03/17/2017  . Ischemic cardiomyopathy 03/17/2017  . Non-STEMI (non-ST elevated myocardial infarction) ([place]) 03/16/2017  . Acquired hypothyroidism 10/05/2015  . Poor social situation 10/05/2015  . Weight loss, unintentional 10/05/2015  . Homeless 04/06/2014  . History of lung cancer 08/08/2011  . DM2 (diabetes mellitus, type 2) ([place]) 08/03/2011  . Hyperlipidemia with target low density lipoprotein (LDL) cholesterol less than 70 mg/dL   . Diabetic neuropathy, type II diabetes mellitus ([place])   . Hepatic steatosis      Current Outpatient Medications on File Prior to Visit  Medication Sig Dispense Refill  . aspirin EC 325 MG EC tablet Take 1 tablet (325 mg total) by mouth daily. 30 tablet 0  . atorvastatin (LIPITOR) 80 MG tablet Take 0.5 tablets (40 mg total) by mouth daily at 6 PM. 30 tablet 6  . insulin aspart (NOVOLOG) 100 UNIT/ML injection Inject 16 Units into the skin 3 (three) times daily before meals. 20 mL 11  . Blood Glucose Monitoring Suppl (TRUE METRIX METER) w/Device KIT 1 Units by Does not apply route 3 (three) times daily as needed. 1 kit 0  . gabapentin  (NEURONTIN) 300 MG capsule Take 1 capsule (300 mg total) by mouth at bedtime. 90 capsule 3  . glucose blood (TRUE METRIX BLOOD GLUCOSE TEST) test strip Use as instructed 100 each 12  . TRUEPLUS LANCETS 28G MISC 28 g by Does not apply route 3 (three) times daily. 100 each 3   No current facility-administered medications on file prior to visit.     Allergies  Allergen Reactions  . No Known Allergies     Social History   Socioeconomic History  . Marital status: Married    Spouse name: Not on file  . Number of children: Not on file  . Years of education: Not on file  . Highest education level: Not on file  Social Needs  . Financial resource strain: Not on file  . Food insecurity - worry: Not on file  . Food insecurity - inability: Not on file  . Transportation needs - medical: Not on file  . Transportation needs - non-medical: Not on file  Occupational History    Employer: UNEMPLOYED  Tobacco Use  . Smoking status: Former Research scientist (life sciences)  . Smokeless tobacco: Never Used  Substance and Sexual Activity  . Alcohol use: No  . Drug use: No  . Sexual activity: Not on file  Other Topics Concern  . Not on file  Social History Narrative  . Not on file  Family History  Problem Relation Age of Onset  . CAD Neg Hx   . Hypertension Neg Hx   . Diabetes Neg Hx     Past Surgical History:  Procedure Laterality Date  . CORONARY ARTERY BYPASS GRAFT N/A 03/23/2017   Procedure: CORONARY ARTERY BYPASS GRAFTING (CABG) x , four using left internal mammary artery and right greater saphenous vein harvested endoscopically;  Surgeon: Ivin Poot, MD;  Location: [facility];  Service: Open Heart Surgery;  Laterality: N/A;  . ENDOVEIN HARVEST OF GREATER SAPHENOUS VEIN Right 03/23/2017   Procedure: ENDOVEIN HARVEST OF GREATER SAPHENOUS VEIN;  Surgeon: Ivin Poot, MD;  Location: [facility];  Service: Open Heart Surgery;  Laterality: Right;  . LEFT HEART CATH AND CORONARY ANGIOGRAPHY Left 03/16/2017    Procedure: Left Heart Cath and Coronary Angiography;  Surgeon: Isaias Cowman, MD;  Location: [facility];  Service: Cardiovascular;  Laterality: Left;  . none    . RIGHT HEART CATH N/A 03/21/2017   Procedure: Right Heart Cath;  Surgeon: Larey Dresser, MD;  Location: [facility];  Service: Cardiovascular;  Laterality: N/A;  . TEE WITHOUT CARDIOVERSION N/A 03/23/2017   Procedure: TRANSESOPHAGEAL ECHOCARDIOGRAM (TEE);  Surgeon: Prescott Gum, Collier Salina, MD;  Location: [facility];  Service: Open Heart Surgery;  Laterality: N/A;    ROS: Review of Systems Neg except as above  PHYSICAL EXAM: BP 115/75 (BP Location: Left Arm, Patient Position: Sitting, Cuff Size: Large)   Pulse (!) 102   Temp 98.6 F (37 C) (Oral)   Resp 16   Wt 175 lb (79.4 kg)   SpO2 97%   BMI 27.41 kg/m   Wt Readings from Last 3 Encounters:  10/23/17 175 lb (79.4 kg)  09/04/17 170 lb (77.1 kg)  06/07/17 169 lb 9.6 oz (76.9 kg)    Physical Exam  General appearance - alert, well appearing, and in no distress Mental status - alert, oriented to person, place, and time, normal mood, behavior, speech, dress, motor activity, and thought processes Chest - clear to auscultation, no wheezes, rales or rhonchi, symmetric air entry Heart - normal rate, regular rhythm, normal S1, S2, no murmurs, rubs, clicks or gallops Extremities - peripheral pulses normal, no pedal edema, no clubbing or cyanosis   Results for orders placed or performed in visit on 10/23/17  POCT glucose (manual entry)  Result Value Ref Range   POC Glucose 330 (A) 70 - 99 mg/dl     ASSESSMENT AND PLAN: 1. Type 2 diabetes mellitus with diabetic neuropathy, with long-term current use of insulin (HCC) Reported blood sugars still not at goal. Increase Lantus to 48 units twice a day.  Increase NovoLog to 16 units 3 times a day as was advised on last visit Try to cut back on eating rice - POCT glucose (manual entry) - insulin glargine (LANTUS)  100 UNIT/ML injection; Inject 0.48 mLs (48 Units total) into the skin 2 (two) times daily.  Dispense: 30 mL; Refill: 2  2. Coronary artery disease involving coronary bypass graft of native heart without angina pectoris -Since patient has been out of furosemide for a month with no shortness of breath or lower extremity edema on exam I have changed the dose to 20 mg as needed.  Patient told to take if he noticed any swelling in the legs or shortness of breath. - carvedilol (COREG) 12.5 MG tablet; Take 1 tablet (12.5 mg total) by mouth 2 (two) times daily with a meal.  Dispense: 60  tablet; Refill: 6 - digoxin (LANOXIN) 0.25 MG tablet; Take 1 tablet (0.25 mg total) by mouth daily.  Dispense: 30 tablet; Refill: 5 - lisinopril (PRINIVIL,ZESTRIL) 5 MG tablet; Take 0.5 tablets (2.5 mg total) by mouth daily.  Dispense: 45 tablet; Refill: 3 - furosemide (LASIX) 20 MG tablet; 1 tab PO daily as needed for swelling in legs  Dispense: 30 tablet; Refill: 2  Patient was given the opportunity to ask questions.  Patient verbalized understanding of the plan and was able to repeat key elements of the plan.  [employer] interpreter used during this encounter.  Orders Placed This Encounter  Procedures  . POCT glucose (manual entry)     Requested Prescriptions   Signed Prescriptions Disp Refills  . insulin glargine (LANTUS) 100 UNIT/ML injection 30 mL 2    Sig: Inject 0.48 mLs (48 Units total) into the skin 2 (two) times daily.  . carvedilol (COREG) 12.5 MG tablet 60 tablet 6    Sig: Take 1 tablet (12.5 mg total) by mouth 2 (two) times daily with a meal.  . digoxin (LANOXIN) 0.25 MG tablet 30 tablet 5    Sig: Take 1 tablet (0.25 mg total) by mouth daily.  Marland Kitchen lisinopril (PRINIVIL,ZESTRIL) 5 MG tablet 45 tablet 3    Sig: Take 0.5 tablets (2.5 mg total) by mouth daily.  . furosemide (LASIX) 20 MG tablet 30 tablet 2    Sig: 1 tab PO daily as needed for swelling in legs    Return in about 2 months (around  12/21/2017).  Karle Plumber, MD, FACP

## 2017-10-23 NOTE — Progress Notes (Signed)
Follow up Med RF

## 2017-10-24 MED FILL — LISINOPRIL 5 MG TABLET: 5 | 30 days supply | Qty: 15 | Fill #0

## 2017-10-24 MED FILL — LANTUS 100 UNITS/ML VIAL: 100 | 30 days supply | Qty: 30 | Fill #0

## 2017-10-24 MED FILL — DIGITEK 250 MCG TABLET: 250 | 30 days supply | Qty: 30 | Fill #0

## 2017-10-24 MED FILL — FUROSEMIDE 20 MG TABLET: 20 | 30 days supply | Qty: 30 | Fill #0

## 2017-11-02 LAB — POCT ABI - SCREENING FOR PILOT NO CHARGE: Other: NEGATIVE

## 2017-11-12 DIAGNOSIS — I5021 Acute systolic (congestive) heart failure: Secondary | ICD-10-CM | POA: Diagnosis not present

## 2017-11-12 DIAGNOSIS — I25119 Atherosclerotic heart disease of native coronary artery with unspecified angina pectoris: Secondary | ICD-10-CM | POA: Diagnosis not present

## 2017-11-12 DIAGNOSIS — I214 Non-ST elevation (NSTEMI) myocardial infarction: Secondary | ICD-10-CM | POA: Diagnosis not present

## 2017-11-12 DIAGNOSIS — Z85118 Personal history of other malignant neoplasm of bronchus and lung: Secondary | ICD-10-CM | POA: Diagnosis not present

## 2017-11-12 DIAGNOSIS — I255 Ischemic cardiomyopathy: Secondary | ICD-10-CM | POA: Diagnosis not present

## 2017-11-12 DIAGNOSIS — R0989 Other specified symptoms and signs involving the circulatory and respiratory systems: Secondary | ICD-10-CM | POA: Diagnosis not present

## 2017-11-12 DIAGNOSIS — Z951 Presence of aortocoronary bypass graft: Secondary | ICD-10-CM | POA: Diagnosis not present

## 2017-11-12 DIAGNOSIS — E785 Hyperlipidemia, unspecified: Secondary | ICD-10-CM | POA: Diagnosis not present

## 2017-11-14 MED FILL — NovoLOG 100 UNIT/ML SOLN: 100 | 20 days supply | Qty: 10 | Fill #2

## 2017-11-14 MED FILL — CARVEDILOL 12.5 MG TABLET: 12.5 | 30 days supply | Qty: 60 | Fill #1

## 2017-11-14 MED FILL — GABAPENTIN 300 MG CAPSULE: 300 | 30 days supply | Qty: 30 | Fill #4

## 2017-11-21 ENCOUNTER — Telehealth: Payer: Self-pay | Admitting: Internal Medicine

## 2017-11-21 NOTE — Telephone Encounter (Signed)
Min from [facility] office called and requested an update on this patient before seeing him, They requested his A1C levels checked to be able to refer him out to a neurosurgeon. Please follow up PHONE:[phone] E-MAIL:[email or web address] Please follow up

## 2017-11-22 ENCOUNTER — Telehealth: Payer: Self-pay | Admitting: Internal Medicine

## 2017-11-22 NOTE — Telephone Encounter (Signed)
1 page, paperwork received through fax 11-22-17.

## 2017-11-22 NOTE — Telephone Encounter (Signed)
Received fax from dentist office and gave paperwork to Dr. Margarita Rana

## 2017-11-23 ENCOUNTER — Other Ambulatory Visit: Payer: Self-pay | Admitting: Family Medicine

## 2017-12-03 MED FILL — ATORVASTATIN 80 MG TABLET: 80 | 30 days supply | Qty: 15 | Fill #2

## 2017-12-03 MED FILL — DIGITEK 250 MCG TABLET: 250 | 30 days supply | Qty: 30 | Fill #1

## 2017-12-03 MED FILL — NovoLOG 100 UNIT/ML SOLN: 100 | 20 days supply | Qty: 10 | Fill #3

## 2017-12-03 MED FILL — LANTUS 100 UNITS/ML VIAL: 100 | 30 days supply | Qty: 30 | Fill #1

## 2017-12-21 ENCOUNTER — Encounter: Payer: Self-pay | Admitting: Internal Medicine

## 2017-12-21 ENCOUNTER — Ambulatory Visit: Payer: Medicare Other | Attending: Internal Medicine | Admitting: Internal Medicine

## 2017-12-21 VITALS — BP 116/77 | HR 91 | Temp 98.0°F | Resp 16 | Wt 176.8 lb

## 2017-12-21 DIAGNOSIS — Z794 Long term (current) use of insulin: Secondary | ICD-10-CM | POA: Diagnosis not present

## 2017-12-21 DIAGNOSIS — Z79899 Other long term (current) drug therapy: Secondary | ICD-10-CM | POA: Diagnosis not present

## 2017-12-21 DIAGNOSIS — Z7982 Long term (current) use of aspirin: Secondary | ICD-10-CM | POA: Diagnosis not present

## 2017-12-21 DIAGNOSIS — I252 Old myocardial infarction: Secondary | ICD-10-CM | POA: Insufficient documentation

## 2017-12-21 DIAGNOSIS — I480 Paroxysmal atrial fibrillation: Secondary | ICD-10-CM | POA: Insufficient documentation

## 2017-12-21 DIAGNOSIS — I10 Essential (primary) hypertension: Secondary | ICD-10-CM | POA: Insufficient documentation

## 2017-12-21 DIAGNOSIS — K760 Fatty (change of) liver, not elsewhere classified: Secondary | ICD-10-CM | POA: Insufficient documentation

## 2017-12-21 DIAGNOSIS — Z87891 Personal history of nicotine dependence: Secondary | ICD-10-CM | POA: Insufficient documentation

## 2017-12-21 DIAGNOSIS — E1140 Type 2 diabetes mellitus with diabetic neuropathy, unspecified: Secondary | ICD-10-CM

## 2017-12-21 DIAGNOSIS — E785 Hyperlipidemia, unspecified: Secondary | ICD-10-CM | POA: Insufficient documentation

## 2017-12-21 DIAGNOSIS — M5416 Radiculopathy, lumbar region: Secondary | ICD-10-CM | POA: Insufficient documentation

## 2017-12-21 DIAGNOSIS — Z951 Presence of aortocoronary bypass graft: Secondary | ICD-10-CM | POA: Insufficient documentation

## 2017-12-21 DIAGNOSIS — E039 Hypothyroidism, unspecified: Secondary | ICD-10-CM | POA: Diagnosis not present

## 2017-12-21 DIAGNOSIS — Z85118 Personal history of other malignant neoplasm of bronchus and lung: Secondary | ICD-10-CM | POA: Diagnosis not present

## 2017-12-21 DIAGNOSIS — Z9221 Personal history of antineoplastic chemotherapy: Secondary | ICD-10-CM | POA: Diagnosis not present

## 2017-12-21 DIAGNOSIS — Z923 Personal history of irradiation: Secondary | ICD-10-CM | POA: Diagnosis not present

## 2017-12-21 DIAGNOSIS — E119 Type 2 diabetes mellitus without complications: Secondary | ICD-10-CM | POA: Diagnosis present

## 2017-12-21 LAB — POCT GLYCOSYLATED HEMOGLOBIN (HGB A1C): Hemoglobin A1C: 12.7

## 2017-12-21 LAB — GLUCOSE, POCT (MANUAL RESULT ENTRY): POC GLUCOSE: 327 mg/dL — AB (ref 70–99)

## 2017-12-21 MED ORDER — GABAPENTIN 300 MG PO CAPS: 300.0000 mg | ORAL_CAPSULE | Freq: Three times a day (TID) | ORAL | 3 refills | Status: DC

## 2017-12-21 MED ORDER — INSULIN ASPART 100 UNIT/ML ~~LOC~~ SOLN: 24.0000 [IU] | Freq: Three times a day (TID) | SUBCUTANEOUS | 11 refills | Status: DC

## 2017-12-21 MED ORDER — METFORMIN HCL 500 MG PO TABS: 500.0000 mg | ORAL_TABLET | Freq: Two times a day (BID) | ORAL | 3 refills | Status: DC

## 2017-12-21 MED ORDER — INSULIN GLARGINE 100 UNIT/ML ~~LOC~~ SOLN: 50.0000 [IU] | Freq: Two times a day (BID) | SUBCUTANEOUS | 2 refills | Status: DC

## 2017-12-21 MED FILL — GABAPENTIN 300 MG CAPSULE: 300 | 30 days supply | Qty: 90 | Fill #0

## 2017-12-21 MED FILL — metFORMIN HCL 500 MG TABS: 500 | 30 days supply | Qty: 60 | Fill #0

## 2017-12-21 NOTE — Progress Notes (Signed)
Pt states he has pain in his legs ever 5 mins  Pt states he has to sit down when his legs starts to hurt   Pt states he has a hard time breathing at night when he goes to bed

## 2017-12-21 NOTE — Progress Notes (Signed)
Patient ID: George Griffin, male    DOB: 10-14-1960  MRN: [id number]  CC: Diabetes   Subjective: George Griffin is a 57 y.o. male who presents for chronic ds management. His concerns today include:  Hx of DM with neuropathy, hypothyroid, lung CA s/p chem/XRT HL, HTN, CAD/ICM s/p CABG x 4 with EF 30-35%, PAF  1.  DM Need teeth extractions but dentist request A1C 8 or lower. Checking BS BID in a.m (gives range of 100-120), and after breakfast in 200-300 Taking Lantus 47 units BID and Novolog 18-20 units TID -not getting in much exercise because legs feel weak x 1 mth.  Walking with a cane x 3-4 mths.  Afraid of falling.  Pain in lower back that radiates down the legs; + numbness/tingling x 2 mths.  No initiating factor.  Some leakage of urine if he does not get to bathroom right away. Rates pain 5/10.  -no SOB but SOB when he talks. Some cough at times when he lays down.   No swelling in legs. Patient Active Problem List   Diagnosis Date Noted  . Microalbuminuria 09/05/2017  . S/P CABG x 4 03/23/2017  . Coronary artery disease involving native coronary artery of native heart with angina pectoris ([place]) 03/17/2017  . Ischemic cardiomyopathy 03/17/2017  . Non-STEMI (non-ST elevated myocardial infarction) ([place]) 03/16/2017  . Acquired hypothyroidism 10/05/2015  . Poor social situation 10/05/2015  . Weight loss, unintentional 10/05/2015  . Homeless 04/06/2014  . History of lung cancer 08/08/2011  . DM2 (diabetes mellitus, type 2) ([place]) 08/03/2011  . Hyperlipidemia with target low density lipoprotein (LDL) cholesterol less than 70 mg/dL   . Diabetic neuropathy, type II diabetes mellitus ([place])   . Hepatic steatosis      Current Outpatient Medications on File Prior to Visit  Medication Sig Dispense Refill  . aspirin EC 325 MG EC tablet Take 1 tablet (325 mg total) by mouth daily. 30 tablet 0  . atorvastatin (LIPITOR) 80 MG tablet Take 0.5 tablets (40 mg total) by mouth daily at 6 PM. 30 tablet 6   . Blood Glucose Monitoring Suppl (TRUE METRIX METER) w/Device KIT 1 Units by Does not apply route 3 (three) times daily as needed. 1 kit 0  . carvedilol (COREG) 12.5 MG tablet Take 1 tablet (12.5 mg total) by mouth 2 (two) times daily with a meal. 60 tablet 6  . digoxin (LANOXIN) 0.25 MG tablet Take 1 tablet (0.25 mg total) by mouth daily. 30 tablet 5  . furosemide (LASIX) 20 MG tablet 1 tab PO daily as needed for swelling in legs 30 tablet 2  . gabapentin (NEURONTIN) 300 MG capsule Take 1 capsule (300 mg total) by mouth at bedtime. 90 capsule 3  . glucose blood (TRUE METRIX BLOOD GLUCOSE TEST) test strip Use as instructed 100 each 12  . insulin aspart (NOVOLOG) 100 UNIT/ML injection Inject 16 Units into the skin 3 (three) times daily before meals. 20 mL 11  . insulin glargine (LANTUS) 100 UNIT/ML injection Inject 0.48 mLs (48 Units total) into the skin 2 (two) times daily. 30 mL 2  . lisinopril (PRINIVIL,ZESTRIL) 5 MG tablet Take 0.5 tablets (2.5 mg total) by mouth daily. 45 tablet 3  . TRUEPLUS LANCETS 28G MISC 28 g by Does not apply route 3 (three) times daily. 100 each 3   No current facility-administered medications on file prior to visit.     Allergies  Allergen Reactions  . No Known Allergies  Social History   Socioeconomic History  . Marital status: Married    Spouse name: Not on file  . Number of children: Not on file  . Years of education: Not on file  . Highest education level: Not on file  Occupational History    Employer: UNEMPLOYED  Social Needs  . Financial resource strain: Not on file  . Food insecurity:    Worry: Not on file    Inability: Not on file  . Transportation needs:    Medical: Not on file    Non-medical: Not on file  Tobacco Use  . Smoking status: Former Research scientist (life sciences)  . Smokeless tobacco: Never Used  Substance and Sexual Activity  . Alcohol use: No  . Drug use: No  . Sexual activity: Not on file  Lifestyle  . Physical activity:    Days per week:  Not on file    Minutes per session: Not on file  . Stress: Not on file  Relationships  . Social connections:    Talks on phone: Not on file    Gets together: Not on file    Attends religious service: Not on file    Active member of club or organization: Not on file    Attends meetings of clubs or organizations: Not on file    Relationship status: Not on file  . Intimate partner violence:    Fear of current or ex partner: Not on file    Emotionally abused: Not on file    Physically abused: Not on file    Forced sexual activity: Not on file  Other Topics Concern  . Not on file  Social History Narrative  . Not on file    Family History  Problem Relation Age of Onset  . CAD Neg Hx   . Hypertension Neg Hx   . Diabetes Neg Hx     Past Surgical History:  Procedure Laterality Date  . CORONARY ARTERY BYPASS GRAFT N/A 03/23/2017   Procedure: CORONARY ARTERY BYPASS GRAFTING (CABG) x , four using left internal mammary artery and right greater saphenous vein harvested endoscopically;  Surgeon: Ivin Poot, MD;  Location: [place];  Service: Open Heart Surgery;  Laterality: N/A;  . ENDOVEIN HARVEST OF GREATER SAPHENOUS VEIN Right 03/23/2017   Procedure: ENDOVEIN HARVEST OF GREATER SAPHENOUS VEIN;  Surgeon: Ivin Poot, MD;  Location: [facility];  Service: Open Heart Surgery;  Laterality: Right;  . LEFT HEART CATH AND CORONARY ANGIOGRAPHY Left 03/16/2017   Procedure: Left Heart Cath and Coronary Angiography;  Surgeon: Isaias Cowman, MD;  Location: [facility];  Service: Cardiovascular;  Laterality: Left;  . none    . RIGHT HEART CATH N/A 03/21/2017   Procedure: Right Heart Cath;  Surgeon: Larey Dresser, MD;  Location: [facility] [facility];  Service: Cardiovascular;  Laterality: N/A;  . TEE WITHOUT CARDIOVERSION N/A 03/23/2017   Procedure: TRANSESOPHAGEAL ECHOCARDIOGRAM (TEE);  Surgeon: Prescott Gum, Collier Salina, MD;  Location: [facility];  Service: Open Heart Surgery;  Laterality: N/A;     ROS: Review of Systems Negative except as stated above PHYSICAL EXAM: BP 116/77   Pulse 91   Temp 98 F (36.7 C) (Oral)   Resp 16   Wt 176 lb 12.8 oz (80.2 kg)   SpO2 98%   BMI 27.69 kg/m   Wt Readings from Last 3 Encounters:  12/21/17 176 lb 12.8 oz (80.2 kg)  10/23/17 175 lb (79.4 kg)  09/04/17 170 lb (77.1 kg)  Physical Exam  General appearance - alert, well appearing, and in no distress Mental status - alert, oriented to person, place, and time, normal mood, behavior, speech, dress, motor activity, and thought processes Neck - supple, no significant cervical or axillary adenopathy Chest - clear to auscultation, no wheezes, rales or rhonchi, symmetric air entry Heart - normal rate, regular rhythm, normal S1, S2, no murmurs, rubs, clicks or gallops Neurological -mild tenderness on palpation of lumbar spine.  Straight leg raise produces discomfort in the lower back.  Power in both lower extremities 4+/5 with mild increased spasticity.  Knee and ankle jerk reflexes are decreased bilaterally.  Decreased sensation to gross touch in both lower extremities Patient with spastic gait.  Ambulates with a cane  327/12.7  ASSESSMENT AND PLAN: 1. Type 2 diabetes mellitus with diabetic neuropathy, with long-term current use of insulin (HCC) Uncontrolled.  I have asked the clinical pharmacist to meet with patient to make sure that his technique of insulin injection is correct.  Increase Lantus to 50 units twice a day.  Increase NovoLog to 24 units 3 times daily with meals.  Add metformin. Continue to monitor blood sugars and bring in glucometer for next reading. Hold off on approving for extractions until A1C/BS better - POCT glucose (manual entry) - POCT glycosylated hemoglobin (Hb A1C) - metFORMIN (GLUCOPHAGE) 500 MG tablet; Take 1 tablet (500 mg total) by mouth 2 (two) times daily with a meal.  Dispense: 180 tablet; Refill: 3 - insulin glargine (LANTUS) 100 UNIT/ML injection;  Inject 0.5 mLs (50 Units total) into the skin 2 (two) times daily.  Dispense: 30 mL; Refill: 2 - insulin aspart (NOVOLOG) 100 UNIT/ML injection; Inject 24 Units into the skin 3 (three) times daily before meals.  Dispense: 20 mL; Refill: 11 - gabapentin (NEURONTIN) 300 MG capsule; Take 1 capsule (300 mg total) by mouth 3 (three) times daily.  Dispense: 90 capsule; Refill: 3  2. Lumbar radiculopathy, chronic -Given patient's symptoms and history of stage III squamous lung cancer, we will image the lumbar spine for further evaluation - MR Lumbar Spine Wo Contrast; Future  Patient was given the opportunity to ask questions.  Patient verbalized understanding of the plan and was able to repeat key elements of the plan.   No orders of the defined types were placed in this encounter.    Requested Prescriptions    No prescriptions requested or ordered in this encounter    No follow-ups on file.  Karle Plumber, MD, FACP

## 2017-12-21 NOTE — Patient Instructions (Signed)
Increase Lantus to 50 units twice a day. Increase Novolog to 24 units three times a day with meals.

## 2017-12-25 ENCOUNTER — Telehealth: Payer: Self-pay | Admitting: Internal Medicine

## 2017-12-25 NOTE — Telephone Encounter (Signed)
Threasa Beards called and requested for a pre-auth for patients MR lumbar spine wo contrast. Please fu at your earliest convenience.

## 2017-12-26 NOTE — Telephone Encounter (Signed)
Contacted [employer] and spoke to [employer]. intake representative    CPT- [id number] MRI Lumbar Spine w/o Contrast ICD- M54.16 Lumbar Radiculopathy (chronic)    Case # [id number]   Gave all clinical information and was not approved because they are needing additional clinical information. Rena provided fax # and informed me that after faxing in clinical information it will take 2 business days for a decision.    Asked to speak with the clinical reviewer and spoke with Gaylyn Rong Clinical Representative for the Cardiologist department unfortunately I was transferred to the wrong department. Sharice transferred me to the Spine Ortho and Neuro department and I spoke to Clotilde Dieter. Provided additional clinical information  After giving additional clinical information procedure was approved  Authorization #[id number] Effective 12/26/2017  Contacted the scheduling department and spoke to [place] and gave authorization #

## 2017-12-27 ENCOUNTER — Ambulatory Visit (HOSPITAL_COMMUNITY)
Admission: RE | Admit: 2017-12-27 | Discharge: 2017-12-27 | Disposition: A | Discharge status: Home / Self Care | Payer: Medicare Other | Source: Ambulatory Visit | Attending: Internal Medicine | Admitting: Internal Medicine

## 2017-12-27 DIAGNOSIS — M4722 Other spondylosis with radiculopathy, cervical region: Secondary | ICD-10-CM | POA: Diagnosis not present

## 2017-12-27 DIAGNOSIS — M5116 Intervertebral disc disorders with radiculopathy, lumbar region: Secondary | ICD-10-CM | POA: Diagnosis not present

## 2017-12-27 DIAGNOSIS — M5416 Radiculopathy, lumbar region: Secondary | ICD-10-CM

## 2017-12-27 DIAGNOSIS — M5125 Other intervertebral disc displacement, thoracolumbar region: Secondary | ICD-10-CM | POA: Diagnosis not present

## 2017-12-27 DIAGNOSIS — M48061 Spinal stenosis, lumbar region without neurogenic claudication: Secondary | ICD-10-CM | POA: Diagnosis not present

## 2017-12-27 DIAGNOSIS — M545 Low back pain: Secondary | ICD-10-CM | POA: Diagnosis not present

## 2017-12-28 ENCOUNTER — Other Ambulatory Visit: Payer: Self-pay | Admitting: Internal Medicine

## 2017-12-28 DIAGNOSIS — M48062 Spinal stenosis, lumbar region with neurogenic claudication: Secondary | ICD-10-CM

## 2017-12-28 DIAGNOSIS — M5416 Radiculopathy, lumbar region: Secondary | ICD-10-CM

## 2018-01-04 ENCOUNTER — Telehealth: Payer: Self-pay

## 2018-01-04 NOTE — Telephone Encounter (Signed)
CMA attempt to call patient to inform on MRI results.   No answer and left a VM for patient.   Patient will need a vietnamese interpreter. Please inform patient if call back,  Let pt know that MRI of his back show never being compressed in lower back.  Will need to see a neurosurgeon for further evaluation.  He may need to take an interpreter with him to the appointment.  I will submit the referral.

## 2018-01-04 NOTE — Telephone Encounter (Signed)
-----   Message from Jackelyn Knife, [place] sent at 01/04/2018  4:47 PM EDT -----   ----- Message ----- From: Ladell Pier, MD Sent: 12/28/2017   9:53 PM To: Jackelyn Knife, RMA  Let pt know that MRI of his back show never being compressed in lower back.  Will need to see a neurosurgeon for further evaluation.  He may need to take an interpreter with him to the appointment.  I will submit the referral.

## 2018-01-10 MED FILL — ATORVASTATIN 80 MG TABLET: 80 | 30 days supply | Qty: 15 | Fill #3

## 2018-01-10 MED FILL — DIGITEK 250 MCG TABLET: 250 | 30 days supply | Qty: 30 | Fill #2

## 2018-01-10 MED FILL — CARVEDILOL 12.5 MG TABLET: 12.5 | 30 days supply | Qty: 60 | Fill #2

## 2018-01-10 MED FILL — LISINOPRIL 5 MG TAB: 5 | 30 days supply | Qty: 15 | Fill #1

## 2018-02-08 ENCOUNTER — Ambulatory Visit: Payer: Medicare Other | Attending: Internal Medicine | Admitting: Internal Medicine

## 2018-02-08 VITALS — BP 118/76 | HR 93 | Temp 98.2°F | Resp 16 | Wt 182.2 lb

## 2018-02-08 DIAGNOSIS — Z923 Personal history of irradiation: Secondary | ICD-10-CM | POA: Insufficient documentation

## 2018-02-08 DIAGNOSIS — I252 Old myocardial infarction: Secondary | ICD-10-CM | POA: Insufficient documentation

## 2018-02-08 DIAGNOSIS — M5416 Radiculopathy, lumbar region: Secondary | ICD-10-CM | POA: Diagnosis not present

## 2018-02-08 DIAGNOSIS — E1165 Type 2 diabetes mellitus with hyperglycemia: Secondary | ICD-10-CM | POA: Diagnosis not present

## 2018-02-08 DIAGNOSIS — Z85118 Personal history of other malignant neoplasm of bronchus and lung: Secondary | ICD-10-CM | POA: Diagnosis not present

## 2018-02-08 DIAGNOSIS — Z79899 Other long term (current) drug therapy: Secondary | ICD-10-CM | POA: Diagnosis not present

## 2018-02-08 DIAGNOSIS — E118 Type 2 diabetes mellitus with unspecified complications: Secondary | ICD-10-CM | POA: Diagnosis not present

## 2018-02-08 DIAGNOSIS — I2510 Atherosclerotic heart disease of native coronary artery without angina pectoris: Secondary | ICD-10-CM | POA: Insufficient documentation

## 2018-02-08 DIAGNOSIS — Z7982 Long term (current) use of aspirin: Secondary | ICD-10-CM | POA: Diagnosis not present

## 2018-02-08 DIAGNOSIS — M48062 Spinal stenosis, lumbar region with neurogenic claudication: Secondary | ICD-10-CM | POA: Diagnosis not present

## 2018-02-08 DIAGNOSIS — E119 Type 2 diabetes mellitus without complications: Secondary | ICD-10-CM | POA: Diagnosis present

## 2018-02-08 DIAGNOSIS — E1140 Type 2 diabetes mellitus with diabetic neuropathy, unspecified: Secondary | ICD-10-CM | POA: Insufficient documentation

## 2018-02-08 DIAGNOSIS — Z9221 Personal history of antineoplastic chemotherapy: Secondary | ICD-10-CM | POA: Insufficient documentation

## 2018-02-08 DIAGNOSIS — E1100 Type 2 diabetes mellitus with hyperosmolarity without nonketotic hyperglycemic-hyperosmolar coma (NKHHC): Secondary | ICD-10-CM | POA: Diagnosis not present

## 2018-02-08 DIAGNOSIS — E785 Hyperlipidemia, unspecified: Secondary | ICD-10-CM | POA: Diagnosis not present

## 2018-02-08 DIAGNOSIS — E039 Hypothyroidism, unspecified: Secondary | ICD-10-CM | POA: Diagnosis not present

## 2018-02-08 DIAGNOSIS — Z87891 Personal history of nicotine dependence: Secondary | ICD-10-CM | POA: Diagnosis not present

## 2018-02-08 DIAGNOSIS — I255 Ischemic cardiomyopathy: Secondary | ICD-10-CM | POA: Diagnosis not present

## 2018-02-08 DIAGNOSIS — Z794 Long term (current) use of insulin: Secondary | ICD-10-CM | POA: Diagnosis not present

## 2018-02-08 DIAGNOSIS — K760 Fatty (change of) liver, not elsewhere classified: Secondary | ICD-10-CM | POA: Insufficient documentation

## 2018-02-08 DIAGNOSIS — I10 Essential (primary) hypertension: Secondary | ICD-10-CM | POA: Diagnosis not present

## 2018-02-08 DIAGNOSIS — Z951 Presence of aortocoronary bypass graft: Secondary | ICD-10-CM | POA: Diagnosis not present

## 2018-02-08 DIAGNOSIS — IMO0002 Reserved for concepts with insufficient information to code with codable children: Secondary | ICD-10-CM

## 2018-02-08 DIAGNOSIS — I480 Paroxysmal atrial fibrillation: Secondary | ICD-10-CM | POA: Diagnosis not present

## 2018-02-08 LAB — GLUCOSE, POCT (MANUAL RESULT ENTRY): POC GLUCOSE: 280 mg/dL — AB (ref 70–99)

## 2018-02-08 MED FILL — DIGITEK 250 MCG TABLET: 250 | 30 days supply | Qty: 30 | Fill #3

## 2018-02-08 MED FILL — LISINOPRIL 5 MG TAB: 5 | 30 days supply | Qty: 15 | Fill #2

## 2018-02-08 MED FILL — ATORVASTATIN 80 MG TABLET: 80 | 30 days supply | Qty: 15 | Fill #4

## 2018-02-08 MED FILL — LANTUS 100 UNITS/ML VIAL: 100 | 30 days supply | Qty: 30 | Fill #2

## 2018-02-08 MED FILL — GABAPENTIN 300 MG CAPSULE: 300 | 30 days supply | Qty: 90 | Fill #1

## 2018-02-08 NOTE — Patient Instructions (Addendum)
Please schedule to see George Griffin in 1 month for titration of insulin.  Appointment with [facility] Neurosurgery is scheduled for February 26, 2018 @ 1015am

## 2018-02-08 NOTE — Progress Notes (Signed)
Patient ID: George Griffin, male    DOB: 26-Nov-1960  MRN: [id number]  CC: Diabetes   Subjective: George Griffin is a 57 y.o. male who presents for f/u DM and lumbar radiculopathy His concerns today include:  Hx of DM with neuropathy, hypothyroid, lung CA s/p chem/XRT HL, HTN, CAD/ICM s/p CABG x 4 with EF 30-35%, PAF  Lumbar Radiculopathy: On last visit patient complained of symptoms of lumbar radiculopathy.  He was having to ambulate with a cane.  MRI of lumbar spine: IMPRESSION: 1. Transitional lumbosacral anatomy. 2. Lumbar spondylosis and posterior element hypertrophy most notable at L4-5 where there is moderate to severe spinal stenosis and severe bilateral neural foraminal stenosis. Potential bilateral L4 and L5 nerve root impingement with a right extraforaminal disc protrusion contributing. 3. Mild right neural foraminal stenosis at L3-4. 4. Small central disc extrusion at T12-L1 without stenosis.   Pt referred to neurosurgeon.  Appt was set for 01/10/2018 but pt was not aware of appt. Said someone called him but he did not understand the conversation.  DM: On last visit we increased Lantus to 50 units twice a day, increase NovoLog to 24 units with meals and added metformin.  He reports that he is taking 20 units of NovoLog with meals.  He forgot to bring his blood sugar log with him today. He reports fasting blood sugars 100-140, and in 200s after meal.    Patient Active Problem List   Diagnosis Date Noted  . Microalbuminuria 09/05/2017  . S/P CABG x 4 03/23/2017  . Coronary artery disease involving native coronary artery of native heart with angina pectoris ([place]) 03/17/2017  . Ischemic cardiomyopathy 03/17/2017  . Non-STEMI (non-ST elevated myocardial infarction) ([place]) 03/16/2017  . Acquired hypothyroidism 10/05/2015  . Poor social situation 10/05/2015  . Weight loss, unintentional 10/05/2015  . Homeless 04/06/2014  . History of lung cancer 08/08/2011  . DM2 (diabetes  mellitus, type 2) ([place]) 08/03/2011  . Hyperlipidemia with target low density lipoprotein (LDL) cholesterol less than 70 mg/dL   . Diabetic neuropathy, type II diabetes mellitus ([employer])   . Hepatic steatosis      Current Outpatient Medications on File Prior to Visit  Medication Sig Dispense Refill  . aspirin EC 325 MG EC tablet Take 1 tablet (325 mg total) by mouth daily. 30 tablet 0  . atorvastatin (LIPITOR) 80 MG tablet Take 0.5 tablets (40 mg total) by mouth daily at 6 PM. 30 tablet 6  . Blood Glucose Monitoring Suppl (TRUE METRIX METER) w/Device KIT 1 Units by Does not apply route 3 (three) times daily as needed. 1 kit 0  . carvedilol (COREG) 12.5 MG tablet Take 1 tablet (12.5 mg total) by mouth 2 (two) times daily with a meal. 60 tablet 6  . digoxin (LANOXIN) 0.25 MG tablet Take 1 tablet (0.25 mg total) by mouth daily. 30 tablet 5  . furosemide (LASIX) 20 MG tablet 1 tab PO daily as needed for swelling in legs 30 tablet 2  . gabapentin (NEURONTIN) 300 MG capsule Take 1 capsule (300 mg total) by mouth 3 (three) times daily. 90 capsule 3  . glucose blood (TRUE METRIX BLOOD GLUCOSE TEST) test strip Use as instructed 100 each 12  . insulin aspart (NOVOLOG) 100 UNIT/ML injection Inject 24 Units into the skin 3 (three) times daily before meals. 20 mL 11  . insulin glargine (LANTUS) 100 UNIT/ML injection Inject 0.5 mLs (50 Units total) into the skin 2 (two) times daily. 30 mL 2  .  lisinopril (PRINIVIL,ZESTRIL) 5 MG tablet Take 0.5 tablets (2.5 mg total) by mouth daily. 45 tablet 3  . metFORMIN (GLUCOPHAGE) 500 MG tablet Take 1 tablet (500 mg total) by mouth 2 (two) times daily with a meal. 180 tablet 3  . TRUEPLUS LANCETS 28G MISC 28 g by Does not apply route 3 (three) times daily. 100 each 3   No current facility-administered medications on file prior to visit.     Allergies  Allergen Reactions  . No Known Allergies     Social History   Socioeconomic History  . Marital status: Married     Spouse name: Not on file  . Number of children: Not on file  . Years of education: Not on file  . Highest education level: Not on file  Occupational History    Employer: UNEMPLOYED  Social Needs  . Financial resource strain: Not on file  . Food insecurity:    Worry: Not on file    Inability: Not on file  . Transportation needs:    Medical: Not on file    Non-medical: Not on file  Tobacco Use  . Smoking status: Former Research scientist (life sciences)  . Smokeless tobacco: Never Used  Substance and Sexual Activity  . Alcohol use: No  . Drug use: No  . Sexual activity: Not on file  Lifestyle  . Physical activity:    Days per week: Not on file    Minutes per session: Not on file  . Stress: Not on file  Relationships  . Social connections:    Talks on phone: Not on file    Gets together: Not on file    Attends religious service: Not on file    Active member of club or organization: Not on file    Attends meetings of clubs or organizations: Not on file    Relationship status: Not on file  . Intimate partner violence:    Fear of current or ex partner: Not on file    Emotionally abused: Not on file    Physically abused: Not on file    Forced sexual activity: Not on file  Other Topics Concern  . Not on file  Social History Narrative  . Not on file    Family History  Problem Relation Age of Onset  . CAD Neg Hx   . Hypertension Neg Hx   . Diabetes Neg Hx     Past Surgical History:  Procedure Laterality Date  . CORONARY ARTERY BYPASS GRAFT N/A 03/23/2017   Procedure: CORONARY ARTERY BYPASS GRAFTING (CABG) x , four using left internal mammary artery and right greater saphenous vein harvested endoscopically;  Surgeon: Ivin Poot, MD;  Location: [place];  Service: Open Heart Surgery;  Laterality: N/A;  . ENDOVEIN HARVEST OF GREATER SAPHENOUS VEIN Right 03/23/2017   Procedure: ENDOVEIN HARVEST OF GREATER SAPHENOUS VEIN;  Surgeon: Ivin Poot, MD;  Location: [place];  Service: Open Heart Surgery;   Laterality: Right;  . LEFT HEART CATH AND CORONARY ANGIOGRAPHY Left 03/16/2017   Procedure: Left Heart Cath and Coronary Angiography;  Surgeon: Isaias Cowman, MD;  Location: [place] [facility];  Service: Cardiovascular;  Laterality: Left;  . none    . RIGHT HEART CATH N/A 03/21/2017   Procedure: Right Heart Cath;  Surgeon: Larey Dresser, MD;  Location: [place] [facility];  Service: Cardiovascular;  Laterality: N/A;  . TEE WITHOUT CARDIOVERSION N/A 03/23/2017   Procedure: TRANSESOPHAGEAL ECHOCARDIOGRAM (TEE);  Surgeon: Prescott Gum, Collier Salina, MD;  Location: [facility];  Service: Open Heart Surgery;  Laterality: N/A;    ROS: Review of Systems Negative except as stated above PHYSICAL EXAM: BP 118/76   Pulse 93   Temp 98.2 F (36.8 C) (Oral)   Resp 16   Wt 182 lb 3.2 oz (82.6 kg)   SpO2 97%   BMI 28.54 kg/m   Physical Exam  General appearance - alert, well appearing, and in no distress Mental status - normal mood, behavior, speech, dress, motor activity, and thought processes  Results for orders placed or performed in visit on 02/08/18  POCT glucose (manual entry)  Result Value Ref Range   POC Glucose 280 (A) 70 - 99 mg/dl   Lab Results  Component Value Date   HGBA1C 12.7 12/21/2017    ASSESSMENT AND PLAN: 1. Diabetes mellitus type 2, uncontrolled, with complications ([place]) Reported blood sugars are better.  However I would like to see a log.  I will set him up with our clinical pharmacist in about a month.  Advised to bring his blood sugar log readings with him on that visit.  He will continue Lantus 50 units twice a day and NovoLog 24 units with meals - POCT glucose (manual entry)  2. Lumbar radiculopathy 3. Spinal stenosis of lumbar region with neurogenic claudication I will have him see the referral coordinator today to have appointment with neurosurgeon reschedule and given to him at this time.  Patient advised that he may need to take an interpreter with him to the  appointment  Patient was given the opportunity to ask questions.  Patient verbalized understanding of the plan and was able to repeat key elements of the plan.   Orders Placed This Encounter  Procedures  . POCT glucose (manual entry)     Requested Prescriptions    No prescriptions requested or ordered in this encounter    Return in about 2 months (around 04/10/2018).  Karle Plumber, MD, FACP

## 2018-03-05 ENCOUNTER — Other Ambulatory Visit: Payer: Self-pay | Admitting: Internal Medicine

## 2018-03-05 DIAGNOSIS — M48062 Spinal stenosis, lumbar region with neurogenic claudication: Secondary | ICD-10-CM | POA: Diagnosis not present

## 2018-03-05 DIAGNOSIS — Z794 Long term (current) use of insulin: Principal | ICD-10-CM

## 2018-03-05 DIAGNOSIS — I739 Peripheral vascular disease, unspecified: Secondary | ICD-10-CM | POA: Diagnosis not present

## 2018-03-05 DIAGNOSIS — E1140 Type 2 diabetes mellitus with diabetic neuropathy, unspecified: Secondary | ICD-10-CM

## 2018-03-05 MED ORDER — INSULIN GLARGINE 100 UNIT/ML ~~LOC~~ SOLN: 50.0000 [IU] | Freq: Two times a day (BID) | SUBCUTANEOUS | 2 refills | Status: DC

## 2018-03-05 MED FILL — LANTUS 100 UNITS/ML VIAL: 100 | 30 days supply | Qty: 30 | Fill #0

## 2018-03-06 ENCOUNTER — Encounter (HOSPITAL_COMMUNITY): Payer: Medicare Other

## 2018-03-06 ENCOUNTER — Ambulatory Visit (HOSPITAL_COMMUNITY)
Admission: RE | Admit: 2018-03-06 | Discharge: 2018-03-06 | Disposition: A | Discharge status: Home / Self Care | Payer: Medicare Other | Source: Ambulatory Visit | Attending: Vascular Surgery | Admitting: Vascular Surgery

## 2018-03-06 ENCOUNTER — Other Ambulatory Visit (HOSPITAL_COMMUNITY): Payer: Self-pay | Admitting: Neurosurgery

## 2018-03-06 DIAGNOSIS — I739 Peripheral vascular disease, unspecified: Secondary | ICD-10-CM

## 2018-03-13 ENCOUNTER — Ambulatory Visit: Payer: Medicare Other | Admitting: Pharmacist

## 2018-03-13 NOTE — Progress Notes (Deleted)
    S:    PCP: Dr. Wynetta Emery  No chief complaint on file.  Patient arrives ***.  Presents for diabetes evaluation, education, and management at the request of Dr. Wynetta Emery. Patient was referred on 02/08/18.  Patient was last seen by Primary Care Provider on 02/08/18. During that visit, Lantus 50 units BID and Novolog 24 units with meals were both continued. Pt advised to bring in log for today's visit. Office sugar was 280 during that visit.  Patient reports Diabetes was diagnosed in ***.   Family/Social History:  - Former smoker *** quit date - Alcohol use ***   Insurance coverage/medication affordability:  - Patient has coverage thru [place] Medicaid  - Insulin and metformin covered  - Pt states he ***does/does not have trouble with obtaining medications from pharmacy   Patient {Actions; denies-reports:120008} adherence with medications.  Current diabetes medications include: metformin 500 mg BID, Lantus 50 units BID, and Novolog 24 units with meals  Problems with administration of insulin? *** Current hypertension medications include: lisinopril 2.5 mg daily (takes 1/2 of 5 mg tablet), carvedilol 12.5 mg BID  Patient {Actions; denies-reports:120008} hypoglycemic events.  Patient reported dietary habits: Eats *** meals/day Breakfast:*** Lunch:*** Dinner:*** Snacks:*** Drinks:***  Patient-reported exercise habits: ***   Patient {Actions; denies-reports:120008} nocturia.  Patient {Actions; denies-reports:120008} neuropathy. Patient {Actions; denies-reports:120008} visual changes. Patient {Actions; denies-reports:120008} self foot exams.   O:   Lab Results  Component Value Date   HGBA1C 12.7 12/21/2017   There were no vitals filed for this visit.  Lipid Panel     Component Value Date/Time   CHOL 140 03/18/2017 0428   TRIG 183 (H) 03/18/2017 0428   HDL 32 (L) 03/18/2017 0428   CHOLHDL 4.4 03/18/2017 0428   VLDL 37 03/18/2017 0428   LDLCALC 71 03/18/2017 0428    Home  fasting CBG: ***  2 hour post-prandial/random CBG: ***.  Clinical ASCVD: Yes  - CAD with anginal, history of Non-STEMI   A/P: Diabetes longstanding currently ***. Patient is not*** able to verbalize appropriate hypoglycemia management plan. Patient {Is/is not:9024} adherent with medication. Control is suboptimal due to ***. -{Meds adjust:18428} basal insulin *** (insulin ***). Patient will continue to titrate 1 unit every ***days if fasting CBGs > 100mg /dl until fasting CBGs reach goal or next visit.  -{Meds adjust:18428}  rapid insulin *** (insulin ***) to ***.  -{Meds adjust:18428} GLP-1 *** (generic name***) to ***.  -{Meds adjust:18428} SGLT2-I *** (generic name***) to ***. Counseled on sick day rules for ***. -Extensively discussed pathophysiology of DM, recommended lifestyle interventions, dietary effects on glycemic control -Counseled on s/sx of and management of hypoglycemia -Next A1C anticipated ***.   ASCVD risk - secondary prevention in patient with DM and history of clinical ASCVD. Last LDL {Is/is not:9024} controlled. ASCVD risk score {Is/is not:9024} >20%  - high intensity statin indicated. Aspirin is indicated.  -Continued aspirin 81 mg  -Continued atorvastatin 40 mg (0.5 of the 80 mg tablet)  Hypertension longstanding*** currently ***.  BP goal = *** mmHg. Patient {Is/is not:9024} adherent with medication. Control is suboptimal due to ***. -***  Written patient instructions provided.  Total time in face to face counseling *** minutes.   Follow up Pharmacist/PCP*** Clinic Visit in ***.   Patient seen with ***

## 2018-03-18 ENCOUNTER — Encounter: Payer: Self-pay | Admitting: Pharmacist

## 2018-03-18 ENCOUNTER — Ambulatory Visit: Payer: Medicare Other | Attending: Internal Medicine | Admitting: Pharmacist

## 2018-03-18 DIAGNOSIS — I25119 Atherosclerotic heart disease of native coronary artery with unspecified angina pectoris: Secondary | ICD-10-CM | POA: Diagnosis not present

## 2018-03-18 DIAGNOSIS — I214 Non-ST elevation (NSTEMI) myocardial infarction: Secondary | ICD-10-CM | POA: Diagnosis not present

## 2018-03-18 DIAGNOSIS — E1165 Type 2 diabetes mellitus with hyperglycemia: Secondary | ICD-10-CM

## 2018-03-18 DIAGNOSIS — Z87891 Personal history of nicotine dependence: Secondary | ICD-10-CM | POA: Diagnosis not present

## 2018-03-18 DIAGNOSIS — E118 Type 2 diabetes mellitus with unspecified complications: Secondary | ICD-10-CM

## 2018-03-18 DIAGNOSIS — E1140 Type 2 diabetes mellitus with diabetic neuropathy, unspecified: Secondary | ICD-10-CM | POA: Insufficient documentation

## 2018-03-18 DIAGNOSIS — R809 Proteinuria, unspecified: Secondary | ICD-10-CM | POA: Diagnosis not present

## 2018-03-18 DIAGNOSIS — IMO0002 Reserved for concepts with insufficient information to code with codable children: Secondary | ICD-10-CM

## 2018-03-18 DIAGNOSIS — I255 Ischemic cardiomyopathy: Secondary | ICD-10-CM | POA: Diagnosis not present

## 2018-03-18 LAB — GLUCOSE, POCT (MANUAL RESULT ENTRY): POC GLUCOSE: 124 mg/dL — AB (ref 70–99)

## 2018-03-18 MED ORDER — ACCU-CHEK AVIVA DEVI: 0 refills | Status: AC

## 2018-03-18 MED ORDER — ACCU-CHEK AVIVA PLUS W/DEVICE KIT: PACK | 0 refills | Status: DC

## 2018-03-18 MED ORDER — ACCU-CHEK SOFTCLIX LANCETS MISC: 12 refills | Status: DC

## 2018-03-18 MED ORDER — INSULIN ASPART 100 UNIT/ML ~~LOC~~ SOLN: SUBCUTANEOUS | 11 refills | Status: DC

## 2018-03-18 MED ORDER — GLUCOSE BLOOD VI STRP: ORAL_STRIP | 12 refills | Status: DC

## 2018-03-18 MED ORDER — "INSULIN SYRINGE-NEEDLE U-100 30G X 5/16"" 1 ML MISC": 5 refills | Status: DC

## 2018-03-18 MED FILL — TRUEPLUS SYR 1ML 30GX5/16": 30G X 5/16" | 30 days supply | Qty: 100 | Fill #0

## 2018-03-18 MED FILL — TRUEPLUS SYR 1ML 30GX5/16: 30G X 5/16" | 30 days supply | Qty: 100 | Fill #0

## 2018-03-18 MED FILL — NovoLOG 100 UNIT/ML SOLN: 100 | 13 days supply | Qty: 10 | Fill #0

## 2018-03-18 NOTE — Patient Instructions (Signed)
Thank you for coming to see [place] today. Please do the following:   1. Take 60 units 2 times a day of Lantus.  2. Take 24 units of Novolog 3 times a day. Only 2 times if you skip breakfast.   3. Continue taking home blood sugars and record these.   4. I will see you on Aug 6th at 10:30 am.

## 2018-03-18 NOTE — Progress Notes (Signed)
S:    PCP: Dr. Wynetta Emery  No chief complaint on file.  Patient arrives in good spirits.  Presents for diabetes management at the request of Dr. Wynetta Emery. Patient was referred and last seen by PCP on 02/08/18.    He is a 57 YO [place] male with a PMH significant for non-ST elevated MI, CAD with angina, DM with associated neuropathy and microalbuminuria, and ischemic cardiomyopathy.   HPI:  PCP visit 02/08/18: Pt was continued on 50 units BID of Lantus and Novolog was increased to 24 units TID.  Social History:  - Former smoker: quit date - pt reports quitting ~10 yrs ago.  - Alcohol - reports 1 beer every once in a while  Family history:  pertinent negatives: CAD, HTN, DM  Insurance coverage/medication affordability:  - Medicare A & B - [place] Medicaid (for rx coverage)  Medications: Patient reports adherence with insulin but he is taking differently than prescribed  Current diabetes medications include:  - Lantus 50 units BID - Novolog 24 units TID - he reports taking 20 units BID-TID depending on how many meals a day he eats. Sometimes misses morning dose due to not eating breakfast.  - Metformin 500 mg BID - This is on his medication profile but it is not with the other meds he brings in today. When I asked him about the metformin he reported not taking.  Patient denies hypoglycemic events. No objective values < 70 on home sugar log.   Patient reported dietary habits:  - Eats 2-3 meals/day. Most often omits breakfast and morning dose of Novolog. - Reports not eating red meat/poultry.  - Reports eating fish, white rice, and boiled vegetables.  - Reports that dinner is his biggest meal of the day. - Reports eating 2-3 cookies before bedtime when he can't fall asleep.   Patient-reported exercise habits:  - Pt is unable to tolerate physical activity due to back pain.    Patient reports nocturia when his sugar is really high  Patient reports neuropathy. This is baseline for him.  He reports being able to tell if his home sugars are too high by numbness in his feet.  Patient denies visual changes. Patient denies self foot exams.   O:  POCT glucose: 124 Patient has not eaten today.   Lab Results  Component Value Date   HGBA1C 12.7 12/21/2017      Component Value Date/Time   CHOL 140 03/18/2017 0428   TRIG 183 (H) 03/18/2017 0428   HDL 32 (L) 03/18/2017 0428   CHOLHDL 4.4 03/18/2017 0428   VLDL 37 03/18/2017 0428   LDLCALC 71 03/18/2017 0428   Patient brings in home sugar log. Tests fasting and before bedtime levels. Home fasting CBG:  74 - 211. Most readings in the mid-high 100s. Single readings of 74 and 211 are outliers.  Bedtime: 104 - 200.    Clinical ASCVD: Yes - patient with DM and non-ST elevated MI, CAD with angina  A/P:  Diabetes longstanding currently uncontrolled based on last A1c of 12.7 and several home levels above goal. He is adherent with insulin but not metformin. Of note, he is taking Novolog and Lantus differently than prescribed. He initially reported to my student that he uses both insulins TID. Upon further questioning, it appears he has only used Lantus TID 1 time. However, I am not sure if this is true because of the language barrier. When reviewing his chart, I note that he has reported taking basal insulin TID  before.  I had him demonstrate exactly how he draws up his insulin. I have indicated on the syringe the correct amount of Lantus and Novolog to take. Pt is understanding. Demonstrates understanding using teach-back. Also, pt is taking Novolog after he eats. Emphasized the importance of taking bolus insulin 10-15 minutes before eating.  Several fasting levels above goal. Pt has 1 outlier of 74 and 1 outlier of 211. Most other sugar levels since his last PCP visit have been in mid- to high-100s. He has some goal readings and his POCT value was at goal today. Will increase Lantus from 50 units BID to 60 units BID for better fasting  glucose control. I have emphasized that this is to be taken only BID. I anticipate his bolus insulin requirement to decrease with the increase in Lantus. Will re-eval at future encounters. Emphasized the importance of eating regularly. He knows to contact me should sugar levels fall below 70. Patient is able to verbalize appropriate hypoglycemia management plan.  -Increased dose of Lantus (insulin glargine) from 50 units BID to 60 units BID.  -Increased dose of  Novolog (insulin aspart) from 20 units TID to 24 units TID.   - Patient knows to hold Novolog dose if he skips a meal.  -Recommended lifestyle interventions, dietary effects on glycemic control -Counseled on s/sx of and management of hypoglycemia -Next A1C anticipated at next PCP visit 04/04/18.  ASCVD risk: Secondary prevention in patient with DM and history of non-ST elevated MI, CAD with angina, and ischemic cardiomyopathy.  LDL goal < 70. Last LDL is controlled at 71. High intensity statin indicated. Aspirin is indicated.  -Continued aspirin 325 mg  -Continued atorvastatin 40 mg (1/2 of 80 mg tablet).   Written patient instructions provided.  Total time in face to face counseling 30 minutes. Of note, contracted interpreter was not present until closing minutes of the encounter because patient was 2 hours early. He had a good understanding of english and refused an interpreter when I offered once at the start of the encounter.  Follow up PCP clinic visit in 2 weeks.   Ricardo Jericho, PharmD Candidate  [employer] Class of 2020  Benard Halsted, PharmD, [place] [phone]

## 2018-04-01 DIAGNOSIS — M48062 Spinal stenosis, lumbar region with neurogenic claudication: Secondary | ICD-10-CM | POA: Diagnosis not present

## 2018-04-02 ENCOUNTER — Ambulatory Visit: Payer: Medicare Other | Admitting: Pharmacist

## 2018-04-04 ENCOUNTER — Encounter: Payer: Self-pay | Admitting: Internal Medicine

## 2018-04-04 ENCOUNTER — Ambulatory Visit: Payer: Medicare Other | Attending: Internal Medicine | Admitting: Internal Medicine

## 2018-04-04 ENCOUNTER — Ambulatory Visit: Payer: Medicare Other | Admitting: Pharmacist

## 2018-04-04 ENCOUNTER — Other Ambulatory Visit: Payer: Self-pay

## 2018-04-04 VITALS — BP 132/73 | HR 91 | Temp 97.9°F | Resp 16 | Wt 178.8 lb

## 2018-04-04 DIAGNOSIS — Z85118 Personal history of other malignant neoplasm of bronchus and lung: Secondary | ICD-10-CM | POA: Insufficient documentation

## 2018-04-04 DIAGNOSIS — I480 Paroxysmal atrial fibrillation: Secondary | ICD-10-CM | POA: Diagnosis not present

## 2018-04-04 DIAGNOSIS — I10 Essential (primary) hypertension: Secondary | ICD-10-CM | POA: Insufficient documentation

## 2018-04-04 DIAGNOSIS — M5416 Radiculopathy, lumbar region: Secondary | ICD-10-CM | POA: Diagnosis not present

## 2018-04-04 DIAGNOSIS — Z9889 Other specified postprocedural states: Secondary | ICD-10-CM | POA: Insufficient documentation

## 2018-04-04 DIAGNOSIS — I255 Ischemic cardiomyopathy: Secondary | ICD-10-CM | POA: Insufficient documentation

## 2018-04-04 DIAGNOSIS — I25810 Atherosclerosis of coronary artery bypass graft(s) without angina pectoris: Secondary | ICD-10-CM | POA: Diagnosis not present

## 2018-04-04 DIAGNOSIS — E1140 Type 2 diabetes mellitus with diabetic neuropathy, unspecified: Secondary | ICD-10-CM

## 2018-04-04 DIAGNOSIS — Z87891 Personal history of nicotine dependence: Secondary | ICD-10-CM | POA: Diagnosis not present

## 2018-04-04 DIAGNOSIS — Z794 Long term (current) use of insulin: Secondary | ICD-10-CM | POA: Diagnosis not present

## 2018-04-04 DIAGNOSIS — Z79899 Other long term (current) drug therapy: Secondary | ICD-10-CM | POA: Diagnosis not present

## 2018-04-04 DIAGNOSIS — Z7982 Long term (current) use of aspirin: Secondary | ICD-10-CM | POA: Insufficient documentation

## 2018-04-04 LAB — POCT GLYCOSYLATED HEMOGLOBIN (HGB A1C): HEMOGLOBIN A1C: 10.8 % — AB (ref 4.0–5.6)

## 2018-04-04 LAB — GLUCOSE, POCT (MANUAL RESULT ENTRY): POC GLUCOSE: 355 mg/dL — AB (ref 70–99)

## 2018-04-04 MED ORDER — CARVEDILOL 12.5 MG PO TABS: 12.5000 mg | ORAL_TABLET | Freq: Two times a day (BID) | ORAL | 3 refills | Status: DC

## 2018-04-04 MED ORDER — METFORMIN HCL 500 MG PO TABS: 500.0000 mg | ORAL_TABLET | Freq: Two times a day (BID) | ORAL | 3 refills | Status: DC

## 2018-04-04 MED ORDER — DIGOXIN 250 MCG PO TABS: 0.2500 mg | ORAL_TABLET | Freq: Every day | ORAL | 2 refills | Status: DC

## 2018-04-04 MED ORDER — LISINOPRIL 5 MG PO TABS: 2.5000 mg | ORAL_TABLET | Freq: Every day | ORAL | 3 refills | Status: DC

## 2018-04-04 MED ORDER — GABAPENTIN 300 MG PO CAPS: 300.0000 mg | ORAL_CAPSULE | Freq: Three times a day (TID) | ORAL | 3 refills | Status: DC

## 2018-04-04 MED ORDER — INSULIN ASPART 100 UNIT/ML ~~LOC~~ SOLN: SUBCUTANEOUS | 3 refills | Status: DC

## 2018-04-04 MED ORDER — ATORVASTATIN CALCIUM 80 MG PO TABS: 40.0000 mg | ORAL_TABLET | Freq: Every day | ORAL | 3 refills | Status: DC

## 2018-04-04 MED ORDER — FUROSEMIDE 20 MG PO TABS: ORAL_TABLET | ORAL | 2 refills | Status: DC

## 2018-04-04 MED ORDER — INSULIN GLARGINE 100 UNIT/ML ~~LOC~~ SOLN: 63.0000 [IU] | Freq: Two times a day (BID) | SUBCUTANEOUS | 6 refills | Status: DC

## 2018-04-04 MED FILL — ATORVASTATIN 80 MG TABLET: 80 | 90 days supply | Qty: 45 | Fill #0

## 2018-04-04 MED FILL — FUROSEMIDE 20 MG TABLET: 20 | 90 days supply | Qty: 90 | Fill #0

## 2018-04-04 MED FILL — GABAPENTIN 300 MG CAPSULE: 300 | 90 days supply | Qty: 270 | Fill #0

## 2018-04-04 MED FILL — CARVEDILOL 12.5 MG TABLET: 12.5 | 90 days supply | Qty: 180 | Fill #0

## 2018-04-04 MED FILL — metFORMIN HCL 500 MG TABS: 500 | 90 days supply | Qty: 180 | Fill #0

## 2018-04-04 MED FILL — LISINOPRIL 5 MG TAB: 5 | 90 days supply | Qty: 45 | Fill #0

## 2018-04-04 NOTE — Progress Notes (Signed)
Patient ID: George Griffin, male    DOB: Oct 30, 1960  MRN: [id number]  CC: Diabetes   Subjective: George Griffin is a 56 y.o. male who presents for short term f/u of DM His concerns today include:  Hx of DM with neuropathy, lung CA s/p chem/XRT HL, HTN, CAD/ICM s/p CABG x 4 with EF 30-35%, PAF  DM: saw clinical pharmacist since last visit for further teaching and insulin titration. Lantus increased from 50 to 60 units BID and Novolog increased to 24 units TID with meals -has a confusing BS log with him.  Checks BS only in a.m.  Range 113-176.  BS this a.m was 365, pt had tea sweeten with regular sugar. -Eating habits:  "I'm [place] all the time. So I eat just about everything."  Does not drink sodas, juices.   Admits that he eats a lot of white carbs  Lumbar radiculopathy:  Saw neurosurgeon Dr. Saintclair Halsted at [facility]. Had inj to back.  He reports pain better post procedure.  Note having to use cane any more.  Has f/u appt 05/07/2018 Patient Active Problem List   Diagnosis Date Noted  . Microalbuminuria 09/05/2017  . S/P CABG x 4 03/23/2017  . Coronary artery disease involving native coronary artery of native heart with angina pectoris ([place]) 03/17/2017  . Ischemic cardiomyopathy 03/17/2017  . Non-STEMI (non-ST elevated myocardial infarction) ([place]) 03/16/2017  . Acquired hypothyroidism 10/05/2015  . Poor social situation 10/05/2015  . Weight loss, unintentional 10/05/2015  . Homeless 04/06/2014  . History of lung cancer 08/08/2011  . DM2 (diabetes mellitus, type 2) ([place]) 08/03/2011  . Hyperlipidemia with target low density lipoprotein (LDL) cholesterol less than 70 mg/dL   . Diabetic neuropathy, type II diabetes mellitus ([place])   . Hepatic steatosis      Current Outpatient Medications on File Prior to Visit  Medication Sig Dispense Refill  . ACCU-CHEK SOFTCLIX LANCETS lancets Use as instructed 100 each 12  . aspirin EC 325 MG EC tablet Take 1 tablet (325 mg total) by mouth  daily. 30 tablet 0  . Blood Glucose Monitoring Suppl (ACCU-CHEK AVIVA) device Use as instructed 1 each 0  . glucose blood (ACCU-CHEK AVIVA PLUS) test strip Use as instructed 100 each 12   No current facility-administered medications on file prior to visit.     Allergies  Allergen Reactions  . No Known Allergies     Social History   Socioeconomic History  . Marital status: Married    Spouse name: Not on file  . Number of children: Not on file  . Years of education: Not on file  . Highest education level: Not on file  Occupational History    Employer: UNEMPLOYED  Social Needs  . Financial resource strain: Not on file  . Food insecurity:    Worry: Not on file    Inability: Not on file  . Transportation needs:    Medical: Not on file    Non-medical: Not on file  Tobacco Use  . Smoking status: Former Research scientist (life sciences)  . Smokeless tobacco: Never Used  Substance and Sexual Activity  . Alcohol use: No  . Drug use: No  . Sexual activity: Not on file  Lifestyle  . Physical activity:    Days per week: Not on file    Minutes per session: Not on file  . Stress: Not on file  Relationships  . Social connections:    Talks on phone: Not on file    Gets  together: Not on file    Attends religious service: Not on file    Active member of club or organization: Not on file    Attends meetings of clubs or organizations: Not on file    Relationship status: Not on file  . Intimate partner violence:    Fear of current or ex partner: Not on file    Emotionally abused: Not on file    Physically abused: Not on file    Forced sexual activity: Not on file  Other Topics Concern  . Not on file  Social History Narrative  . Not on file    Family History  Problem Relation Age of Onset  . CAD Neg Hx   . Hypertension Neg Hx   . Diabetes Neg Hx     Past Surgical History:  Procedure Laterality Date  . CORONARY ARTERY BYPASS GRAFT N/A 03/23/2017   Procedure: CORONARY ARTERY BYPASS GRAFTING (CABG)  x , four using left internal mammary artery and right greater saphenous vein harvested endoscopically;  Surgeon: Ivin Poot, MD;  Location: [facility];  Service: Open Heart Surgery;  Laterality: N/A;  . ENDOVEIN HARVEST OF GREATER SAPHENOUS VEIN Right 03/23/2017   Procedure: ENDOVEIN HARVEST OF GREATER SAPHENOUS VEIN;  Surgeon: Ivin Poot, MD;  Location: [facility];  Service: Open Heart Surgery;  Laterality: Right;  . LEFT HEART CATH AND CORONARY ANGIOGRAPHY Left 03/16/2017   Procedure: Left Heart Cath and Coronary Angiography;  Surgeon: Isaias Cowman, MD;  Location: [facility];  Service: Cardiovascular;  Laterality: Left;  . none    . RIGHT HEART CATH N/A 03/21/2017   Procedure: Right Heart Cath;  Surgeon: Larey Dresser, MD;  Location: [facility];  Service: Cardiovascular;  Laterality: N/A;  . TEE WITHOUT CARDIOVERSION N/A 03/23/2017   Procedure: TRANSESOPHAGEAL ECHOCARDIOGRAM (TEE);  Surgeon: Prescott Gum, Collier Salina, MD;  Location: [facility];  Service: Open Heart Surgery;  Laterality: N/A;    ROS: Review of Systems Neg except as above PHYSICAL EXAM: BP 132/73   Pulse 91   Temp 97.9 F (36.6 C) (Oral)   Resp 16   Wt 178 lb 12.8 oz (81.1 kg)   SpO2 96%   BMI 28.00 kg/m   Physical Exam   General appearance - alert, well appearing, and in no distress Mental status - normal mood, behavior, speech, dress, motor activity, and thought processes Chest - clear to auscultation, no wheezes, rales or rhonchi, symmetric air entry Heart - normal rate, regular rhythm, normal S1, S2, no murmurs, rubs, clicks or gallops  Y1E 10.8/BS 365 ASSESSMENT AND PLAN: 1. Type 2 diabetes mellitus with diabetic neuropathy, with long-term current use of insulin (HCC) A1c has dropped by 3 points. Encourage him to cut back on white carbohydrates and consider using Splenda in his coffee rather than regular sugar. Increase Lantus to 63 units twice a day. Hopefully by next visit in 2 months his  blood sugars are down enough that we can go ahead and approve him for dental procedure. - POCT glucose (manual entry) - POCT glycosylated hemoglobin (Hb A1C) - CBC - Comprehensive metabolic panel - Lipid panel - insulin glargine (LANTUS) 100 UNIT/ML injection; Inject 0.63 mLs (63 Units total) into the skin 2 (two) times daily.  Dispense: 30 mL; Refill: 6 - gabapentin (NEURONTIN) 300 MG capsule; Take 1 capsule (300 mg total) by mouth 3 (three) times daily.  Dispense: 270 capsule; Refill: 3 - atorvastatin (LIPITOR) 80 MG tablet; Take 0.5 tablets (40  mg total) by mouth daily at 6 PM.  Dispense: 45 tablet; Refill: 3 - metFORMIN (GLUCOPHAGE) 500 MG tablet; Take 1 tablet (500 mg total) by mouth 2 (two) times daily with a meal.  Dispense: 180 tablet; Refill: 3  2. Coronary artery disease involving coronary bypass graft of native heart without angina pectoris -This is stable.  Patient requesting 80-month supply on all of his medications - furosemide (LASIX) 20 MG tablet; 1 tab PO daily as needed for swelling in legs  Dispense: 30 tablet; Refill: 2 - digoxin (LANOXIN) 0.25 MG tablet; Take 1 tablet (0.25 mg total) by mouth daily.  Dispense: 90 tablet; Refill: 2 - carvedilol (COREG) 12.5 MG tablet; Take 1 tablet (12.5 mg total) by mouth 2 (two) times daily with a meal.  Dispense: 180 tablet; Refill: 3 - lisinopril (PRINIVIL,ZESTRIL) 5 MG tablet; Take 0.5 tablets (2.5 mg total) by mouth daily.  Dispense: 45 tablet; Refill: 3  3. Lumbar radiculopathy Improve post steroid injection    Patient was given the opportunity to ask questions.  Patient verbalized understanding of the plan and was able to repeat key elements of the plan.   Orders Placed This Encounter  Procedures  . CBC  . Comprehensive metabolic panel  . Lipid panel  . POCT glucose (manual entry)  . POCT glycosylated hemoglobin (Hb A1C)     Requested Prescriptions   Signed Prescriptions Disp Refills  . insulin glargine (LANTUS) 100  UNIT/ML injection 30 mL 6    Sig: Inject 0.63 mLs (63 Units total) into the skin 2 (two) times daily.  . furosemide (LASIX) 20 MG tablet 30 tablet 2    Sig: 1 tab PO daily as needed for swelling in legs  . digoxin (LANOXIN) 0.25 MG tablet 90 tablet 2    Sig: Take 1 tablet (0.25 mg total) by mouth daily.  . insulin aspart (NOVOLOG) 100 UNIT/ML injection 30 mL 3    Sig: Inject 24 units into the skin 3 times daily with meals.  . carvedilol (COREG) 12.5 MG tablet 180 tablet 3    Sig: Take 1 tablet (12.5 mg total) by mouth 2 (two) times daily with a meal.  . lisinopril (PRINIVIL,ZESTRIL) 5 MG tablet 45 tablet 3    Sig: Take 0.5 tablets (2.5 mg total) by mouth daily.  Marland Kitchen gabapentin (NEURONTIN) 300 MG capsule 270 capsule 3    Sig: Take 1 capsule (300 mg total) by mouth 3 (three) times daily.  Marland Kitchen atorvastatin (LIPITOR) 80 MG tablet 45 tablet 3    Sig: Take 0.5 tablets (40 mg total) by mouth daily at 6 PM.  . metFORMIN (GLUCOPHAGE) 500 MG tablet 180 tablet 3    Sig: Take 1 tablet (500 mg total) by mouth 2 (two) times daily with a meal.    Return in about 2 months (around 06/04/2018).  Karle Plumber, MD, FACP

## 2018-04-05 LAB — COMPREHENSIVE METABOLIC PANEL
ALT: 65 IU/L — AB (ref 0–44)
AST: 23 IU/L (ref 0–40)
Albumin/Globulin Ratio: 1.6 (ref 1.2–2.2)
Albumin: 4.9 g/dL (ref 3.5–5.5)
Alkaline Phosphatase: 66 IU/L (ref 39–117)
BUN/Creatinine Ratio: 27 — ABNORMAL HIGH (ref 9–20)
BUN: 26 mg/dL — AB (ref 6–24)
Bilirubin Total: 0.4 mg/dL (ref 0.0–1.2)
CALCIUM: 9.6 mg/dL (ref 8.7–10.2)
CO2: 21 mmol/L (ref 20–29)
CREATININE: 0.97 mg/dL (ref 0.76–1.27)
Chloride: 99 mmol/L (ref 96–106)
GFR calc Af Amer: 100 mL/min/{1.73_m2} (ref >59)
GFR, EST NON AFRICAN AMERICAN: 86 mL/min/{1.73_m2} (ref >59)
GLOBULIN, TOTAL: 3.1 g/dL (ref 1.5–4.5)
GLUCOSE: 343 mg/dL — AB (ref 65–99)
Potassium: 4.8 mmol/L (ref 3.5–5.2)
Sodium: 138 mmol/L (ref 134–144)
Total Protein: 8 g/dL (ref 6.0–8.5)

## 2018-04-05 LAB — LIPID PANEL
CHOL/HDL RATIO: 5.1 ratio — AB (ref 0.0–5.0)
CHOLESTEROL TOTAL: 192 mg/dL (ref 100–199)
HDL: 38 mg/dL — AB (ref >39)
LDL CALC: 124 mg/dL — AB (ref 0–99)
TRIGLYCERIDES: 152 mg/dL — AB (ref 0–149)
VLDL CHOLESTEROL CAL: 30 mg/dL (ref 5–40)

## 2018-04-05 LAB — CBC
Hematocrit: 49.8 % (ref 37.5–51.0)
Hemoglobin: 16.2 g/dL (ref 13.0–17.7)
MCH: 29.1 pg (ref 26.6–33.0)
MCHC: 32.5 g/dL (ref 31.5–35.7)
MCV: 90 fL (ref 79–97)
Platelets: 170 10*3/uL (ref 150–450)
RBC: 5.56 x10E6/uL (ref 4.14–5.80)
RDW: 14.8 % (ref 12.3–15.4)
WBC: 10.8 10*3/uL (ref 3.4–10.8)

## 2018-04-08 ENCOUNTER — Telehealth: Payer: Self-pay

## 2018-04-08 IMAGING — DX DG CHEST 1V PORT
1 series · 1 of 1 positions shown · non-contrast
Comparison: March 25, 2017

CLINICAL DATA: Chest pain

EXAM:
PORTABLE CHEST 1 VIEW

[chest]
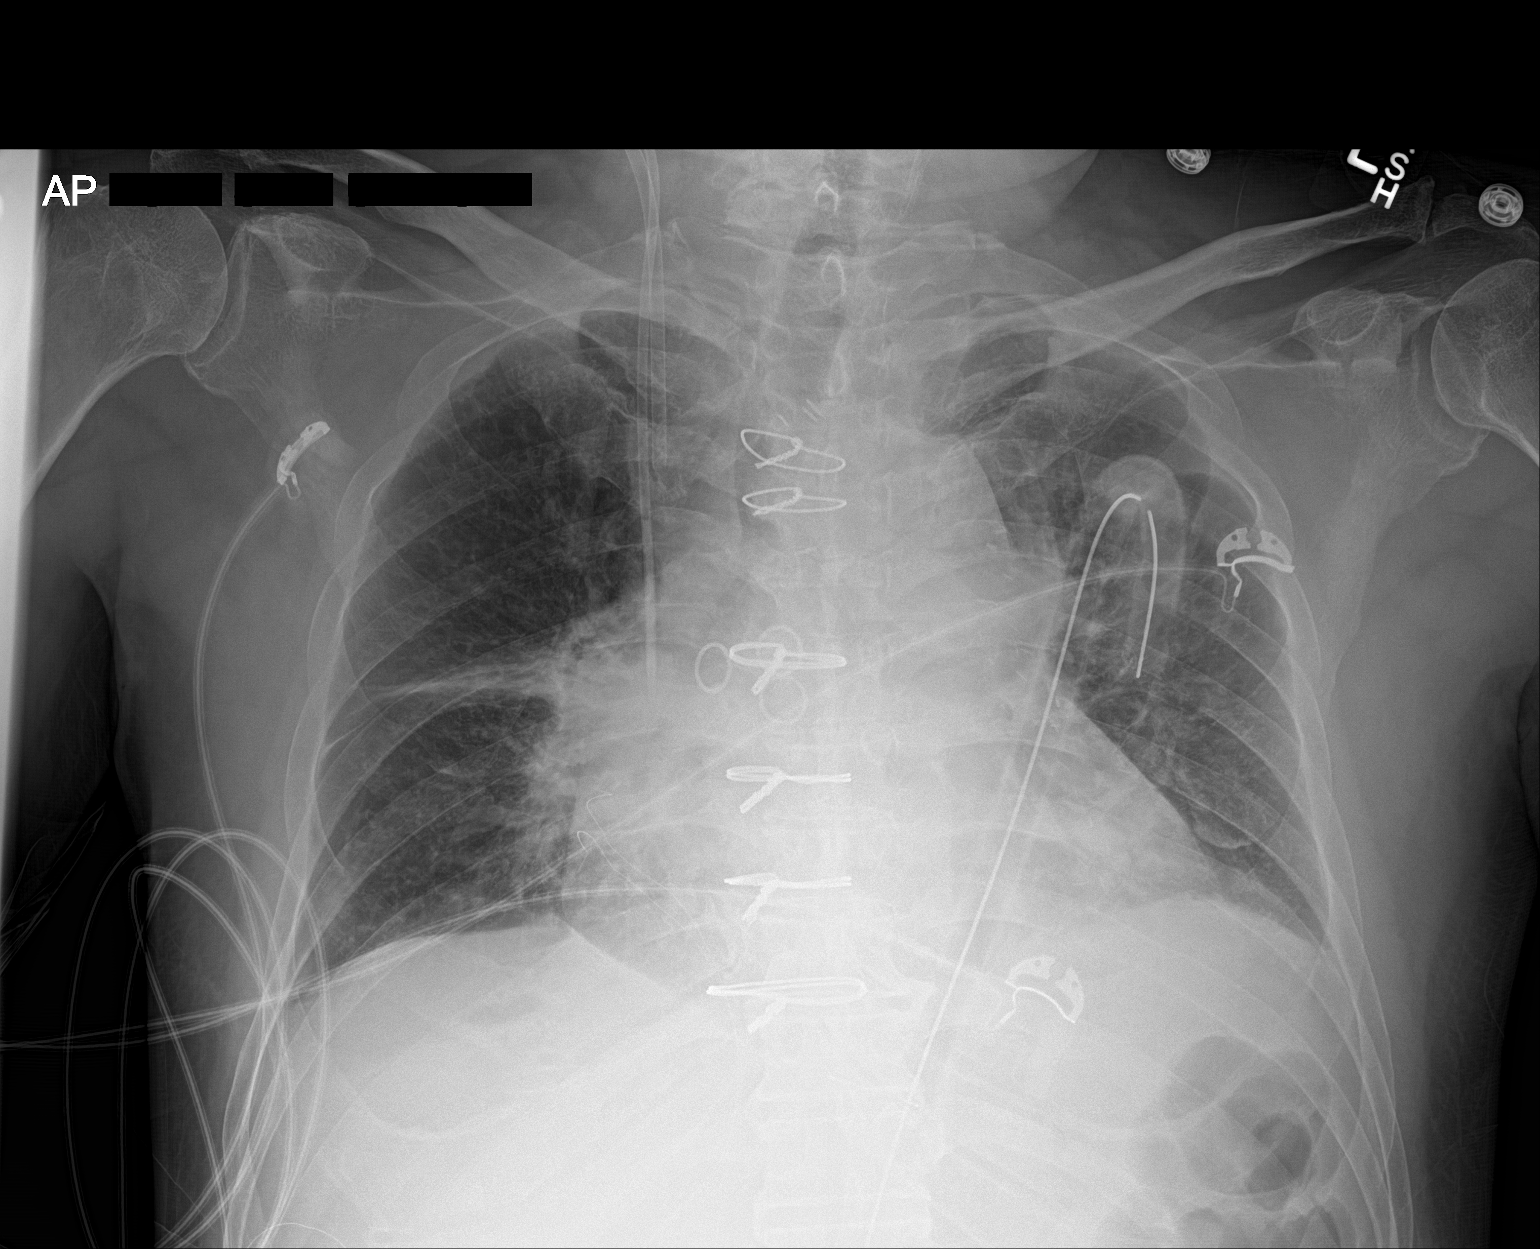

[1 of 1 positions shown; findings below may reference images not displayed]

FINDINGS: Central catheters have tips in the superior vena cava, unchanged.
There is a chest tube on the left. Temporary pacemaker wires are
attached to the right heart. No pneumothorax. There is atelectatic
change in the right mid lung in both bases. Lungs elsewhere clear.
Heart is mildly enlarged with pulmonary vascularity within normal
limits. No evident adenopathy. There is degenerative change in each
shoulder.
IMPRESSION: Tube and catheter positions as described without evident
pneumothorax. Areas of atelectatic change in the right mid lung in
both bases. Lungs elsewhere clear. Stable cardiac silhouette.

## 2018-04-08 NOTE — Telephone Encounter (Signed)
Call placed to the patient with the assistance of [place] interpreter # [phone] with [employer].    Informed the patient that his blood cell count is nl. Kidney function nl. Mild elevation in one of liver enzymes likely due to his cholesterol medication. Will observe for now. LDL cholesterol Elevated at 124 with goal being less than 70. Make sure he takes the cholesterol medication called Atorvastatin daily.  He said the he understood and is taking the atorvastatin daily as ordered and confirmed the dose of atorvastatin that he is taking.  He did not have any other questions.

## 2018-04-09 IMAGING — DX DG CHEST 2V
2 series · 2 of 2 positions shown · non-contrast
Comparison: 03/26/2017 and 04/03/2014

CLINICAL DATA: Atelectasis.  Status post CABG.  Chest tube removal.

EXAM:
CHEST  2 VIEW

[w chest pa]
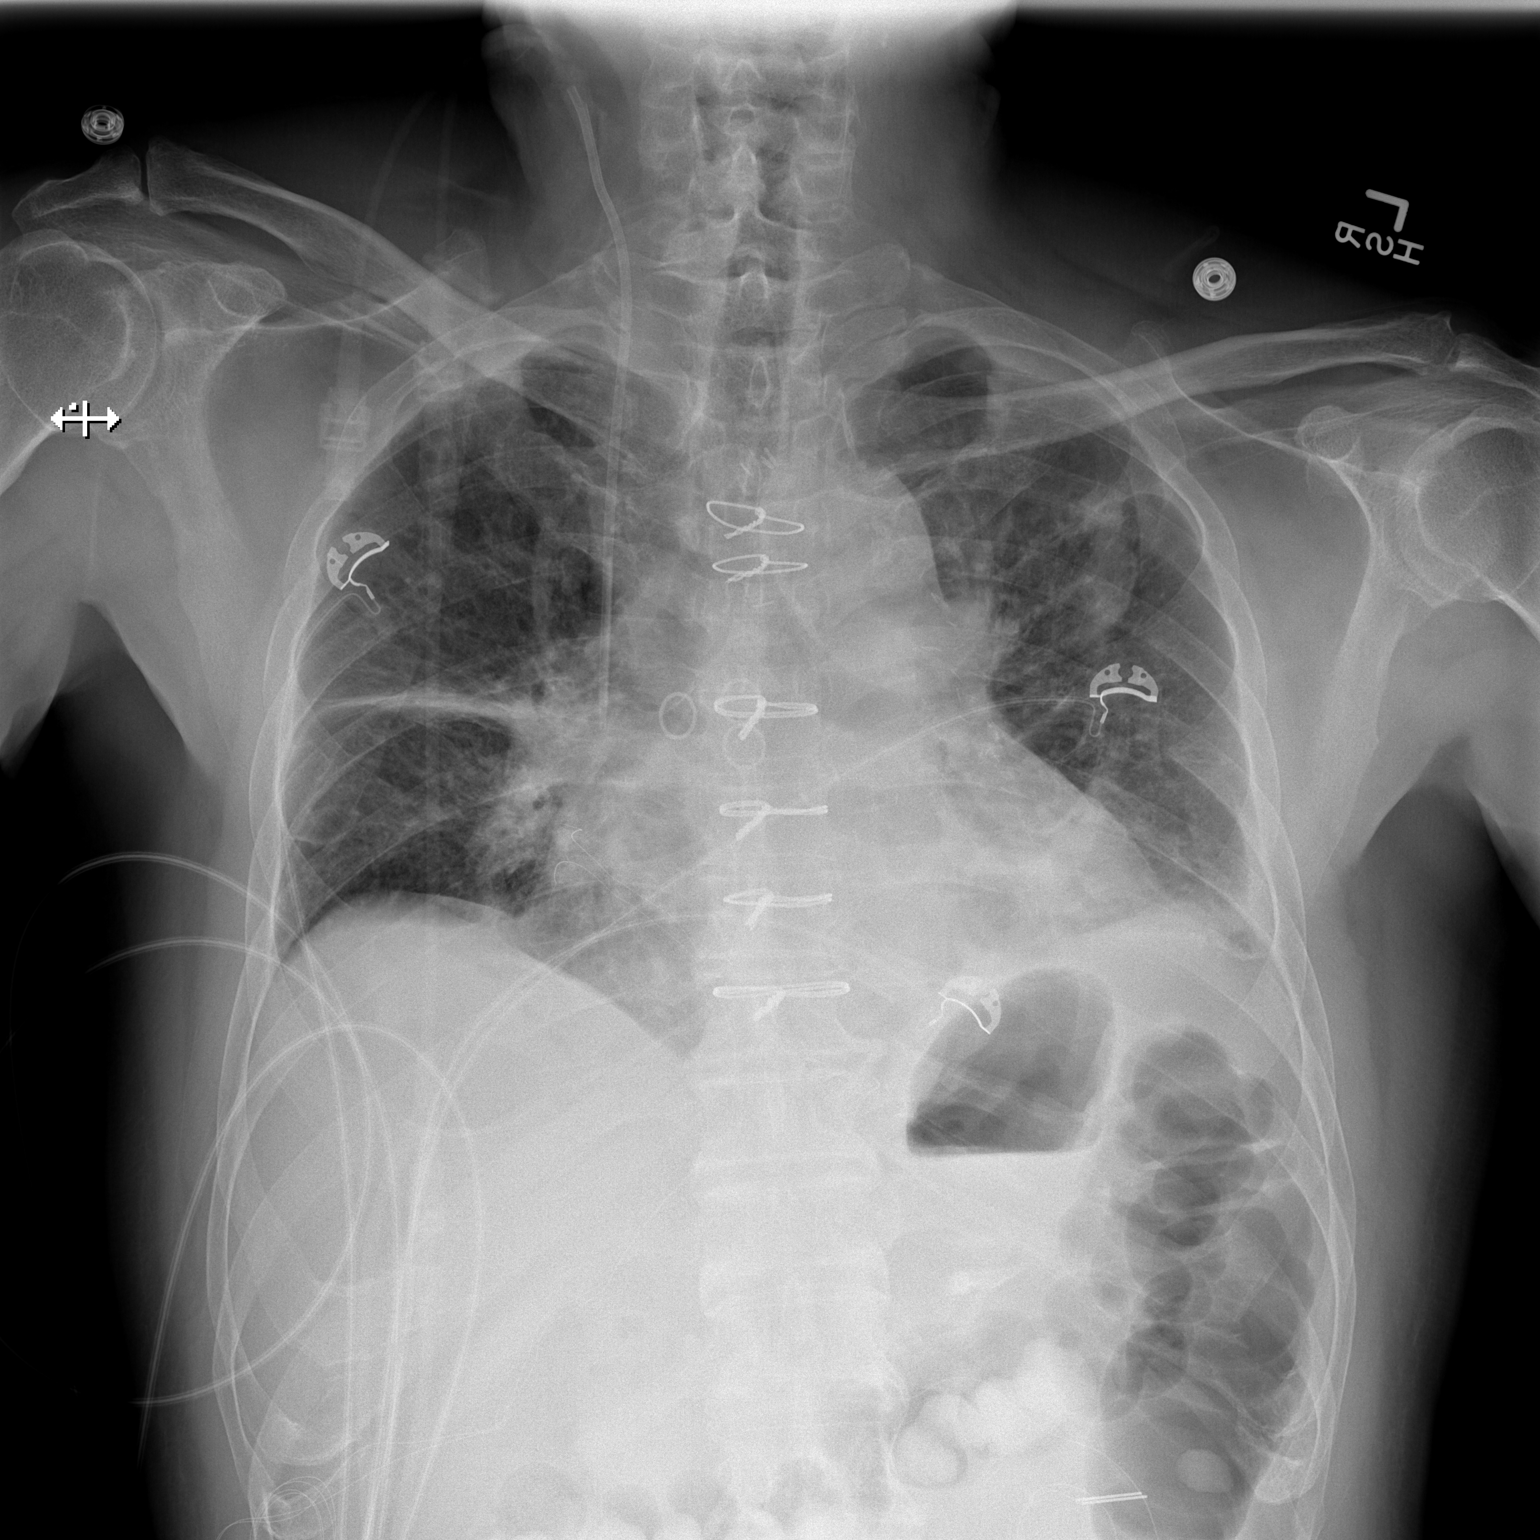

[w chest lat]
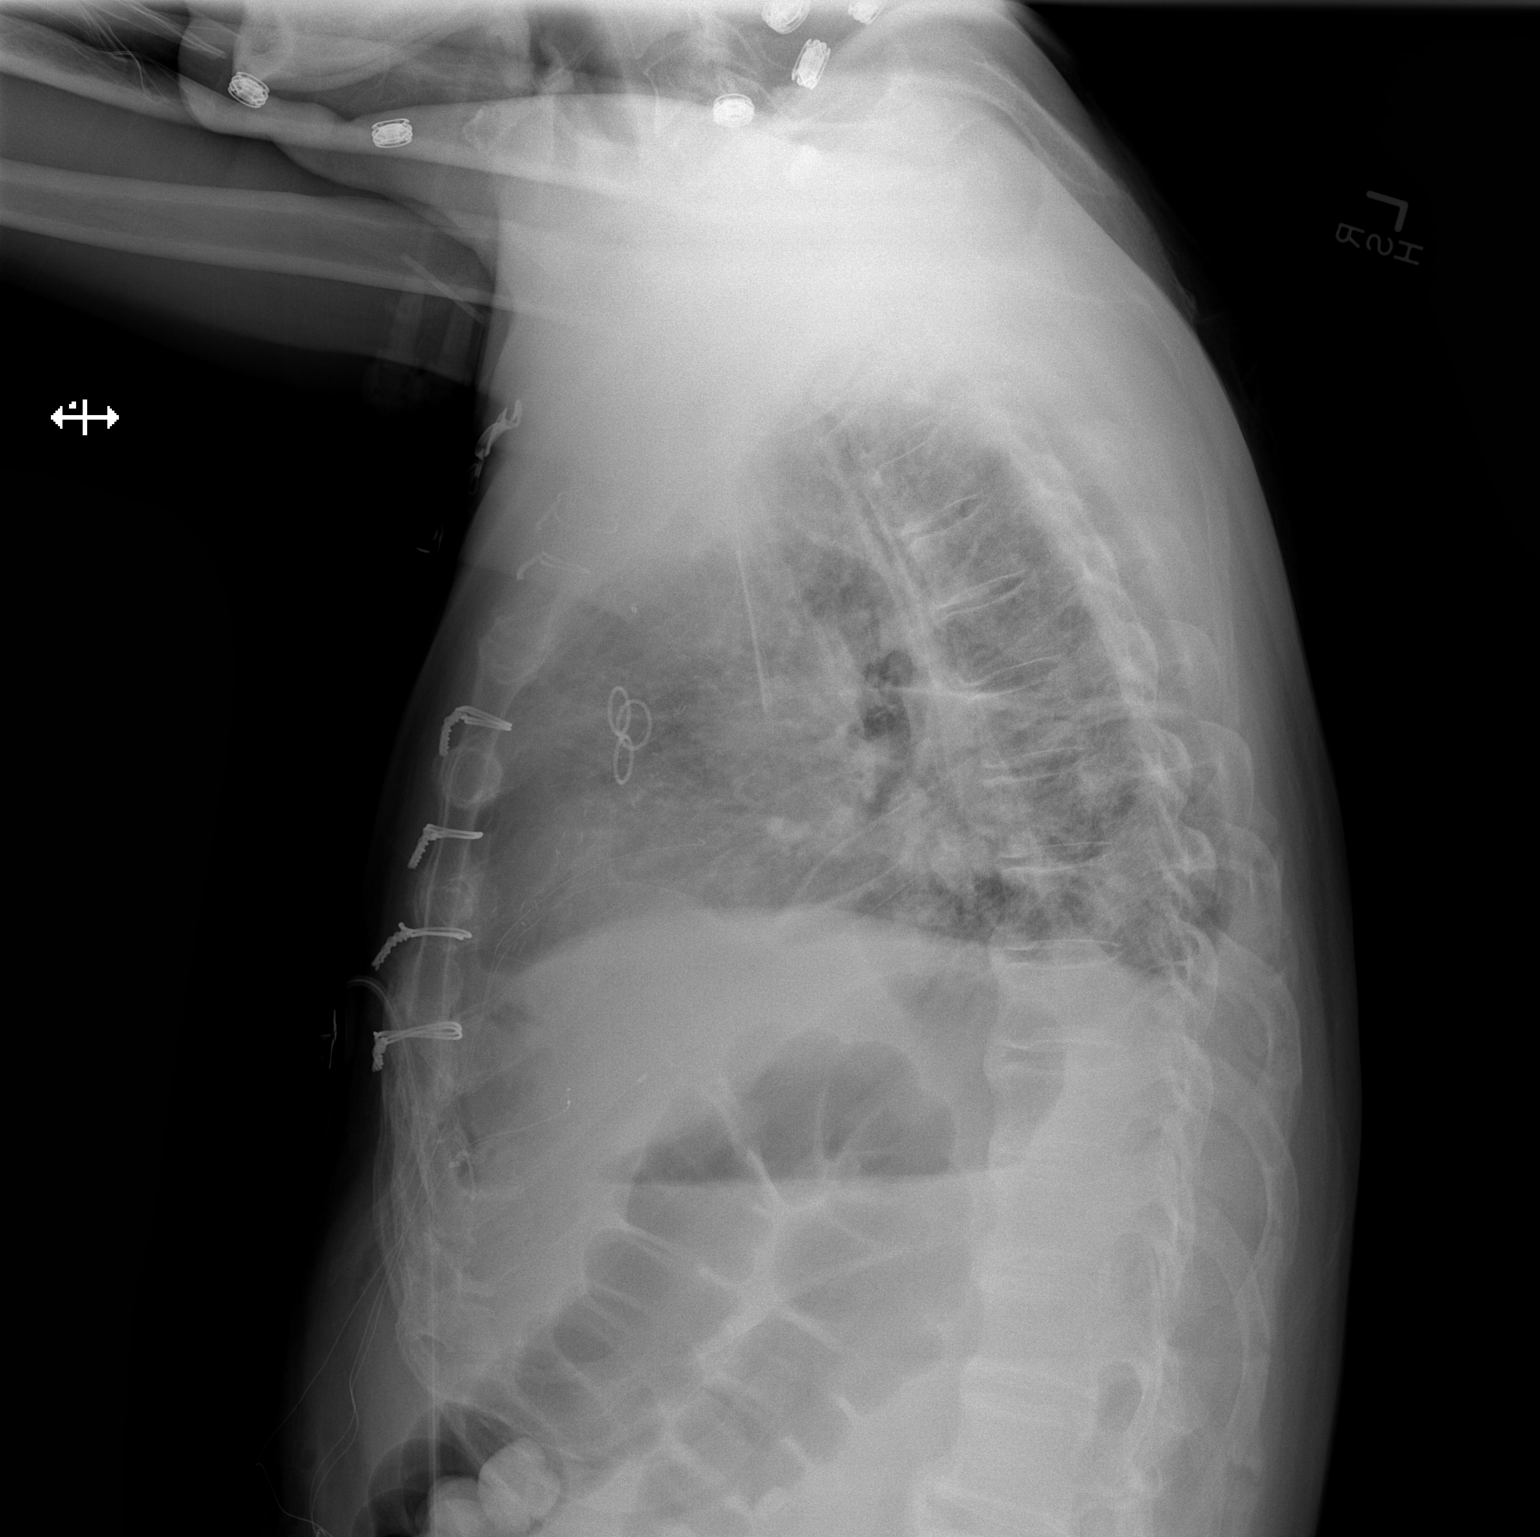

[2 of 2 positions shown; findings below may reference images not displayed]

FINDINGS: Left chest tube has been removed. No pneumothorax. Slight bilateral
atelectasis. Heart size and pulmonary vascularity are normal.
Central line in good position, unchanged.
IMPRESSION: Slight bilateral atelectasis. No pneumothorax after chest tube
removal.

## 2018-04-11 ENCOUNTER — Ambulatory Visit: Payer: Medicare Other | Admitting: Internal Medicine

## 2018-05-07 DIAGNOSIS — M5440 Lumbago with sciatica, unspecified side: Secondary | ICD-10-CM | POA: Diagnosis not present

## 2018-05-07 DIAGNOSIS — Z6828 Body mass index (BMI) 28.0-28.9, adult: Secondary | ICD-10-CM | POA: Diagnosis not present

## 2018-05-16 DIAGNOSIS — E1140 Type 2 diabetes mellitus with diabetic neuropathy, unspecified: Secondary | ICD-10-CM | POA: Diagnosis not present

## 2018-05-16 DIAGNOSIS — E785 Hyperlipidemia, unspecified: Secondary | ICD-10-CM | POA: Diagnosis not present

## 2018-05-16 DIAGNOSIS — I214 Non-ST elevation (NSTEMI) myocardial infarction: Secondary | ICD-10-CM | POA: Diagnosis not present

## 2018-05-16 DIAGNOSIS — Z794 Long term (current) use of insulin: Secondary | ICD-10-CM | POA: Diagnosis not present

## 2018-05-16 DIAGNOSIS — I255 Ischemic cardiomyopathy: Secondary | ICD-10-CM | POA: Diagnosis not present

## 2018-05-16 DIAGNOSIS — Z951 Presence of aortocoronary bypass graft: Secondary | ICD-10-CM | POA: Diagnosis not present

## 2018-06-03 DIAGNOSIS — M5441 Lumbago with sciatica, right side: Secondary | ICD-10-CM | POA: Diagnosis not present

## 2018-06-03 MED FILL — LANTUS 100 UNITS/ML VIAL: 100 | 46 days supply | Qty: 60 | Fill #0

## 2018-06-04 ENCOUNTER — Ambulatory Visit: Payer: Medicare Other | Attending: Internal Medicine | Admitting: Internal Medicine

## 2018-06-04 ENCOUNTER — Encounter: Payer: Self-pay | Admitting: Internal Medicine

## 2018-06-04 VITALS — BP 148/88 | HR 85 | Temp 97.7°F | Resp 16 | Wt 181.8 lb

## 2018-06-04 DIAGNOSIS — Z794 Long term (current) use of insulin: Secondary | ICD-10-CM | POA: Diagnosis not present

## 2018-06-04 DIAGNOSIS — E1140 Type 2 diabetes mellitus with diabetic neuropathy, unspecified: Secondary | ICD-10-CM

## 2018-06-04 DIAGNOSIS — Z23 Encounter for immunization: Secondary | ICD-10-CM | POA: Diagnosis not present

## 2018-06-04 DIAGNOSIS — Z85118 Personal history of other malignant neoplasm of bronchus and lung: Secondary | ICD-10-CM | POA: Insufficient documentation

## 2018-06-04 DIAGNOSIS — I480 Paroxysmal atrial fibrillation: Secondary | ICD-10-CM | POA: Insufficient documentation

## 2018-06-04 DIAGNOSIS — I255 Ischemic cardiomyopathy: Secondary | ICD-10-CM | POA: Diagnosis not present

## 2018-06-04 DIAGNOSIS — E039 Hypothyroidism, unspecified: Secondary | ICD-10-CM | POA: Insufficient documentation

## 2018-06-04 DIAGNOSIS — E785 Hyperlipidemia, unspecified: Secondary | ICD-10-CM

## 2018-06-04 DIAGNOSIS — I25119 Atherosclerotic heart disease of native coronary artery with unspecified angina pectoris: Secondary | ICD-10-CM | POA: Diagnosis not present

## 2018-06-04 DIAGNOSIS — Z79899 Other long term (current) drug therapy: Secondary | ICD-10-CM | POA: Insufficient documentation

## 2018-06-04 DIAGNOSIS — Z7982 Long term (current) use of aspirin: Secondary | ICD-10-CM | POA: Diagnosis not present

## 2018-06-04 DIAGNOSIS — K760 Fatty (change of) liver, not elsewhere classified: Secondary | ICD-10-CM | POA: Insufficient documentation

## 2018-06-04 DIAGNOSIS — I10 Essential (primary) hypertension: Secondary | ICD-10-CM | POA: Diagnosis not present

## 2018-06-04 DIAGNOSIS — Z87891 Personal history of nicotine dependence: Secondary | ICD-10-CM | POA: Diagnosis not present

## 2018-06-04 DIAGNOSIS — Z951 Presence of aortocoronary bypass graft: Secondary | ICD-10-CM | POA: Diagnosis not present

## 2018-06-04 DIAGNOSIS — I25810 Atherosclerosis of coronary artery bypass graft(s) without angina pectoris: Secondary | ICD-10-CM

## 2018-06-04 LAB — GLUCOSE, POCT (MANUAL RESULT ENTRY): POC GLUCOSE: 185 mg/dL — AB (ref 70–99)

## 2018-06-04 LAB — POCT GLYCOSYLATED HEMOGLOBIN (HGB A1C): HbA1c, POC (controlled diabetic range): 10 % — AB (ref 0.0–7.0)

## 2018-06-04 MED ORDER — LISINOPRIL 5 MG PO TABS: 2.5000 mg | ORAL_TABLET | Freq: Every day | ORAL | 3 refills | Status: DC

## 2018-06-04 MED ORDER — INSULIN GLARGINE 100 UNIT/ML ~~LOC~~ SOLN: SUBCUTANEOUS | 6 refills | Status: DC

## 2018-06-04 MED ORDER — METFORMIN HCL 1000 MG PO TABS: 1000.0000 mg | ORAL_TABLET | Freq: Two times a day (BID) | ORAL | 6 refills | Status: DC

## 2018-06-04 NOTE — Patient Instructions (Addendum)
Please give appointment with our clinical pharmacist in 1 month for recheck of blood sugars.  Influenza Virus Vaccine injection (Fluarix) What is this medicine? INFLUENZA VIRUS VACCINE (in floo EN zuh VAHY ruhs vak SEEN) helps to reduce the risk of getting influenza also known as the flu. This medicine may be used for other purposes; ask your health care provider or pharmacist if you have questions. COMMON BRAND NAME(S): Fluarix, Fluzone What should I tell my health care provider before I take this medicine? They need to know if you have any of these conditions: -bleeding disorder like hemophilia -fever or infection -Guillain-Barre syndrome or other neurological problems -immune system problems -infection with the human immunodeficiency virus (HIV) or AIDS -low blood platelet counts -multiple sclerosis -an unusual or allergic reaction to influenza virus vaccine, eggs, chicken proteins, latex, gentamicin, other medicines, foods, dyes or preservatives -pregnant or trying to get pregnant -breast-feeding How should I use this medicine? This vaccine is for injection into a muscle. It is given by a health care professional. A copy of Vaccine Information Statements will be given before each vaccination. Read this sheet carefully each time. The sheet may change frequently. Talk to your pediatrician regarding the use of this medicine in children. Special care may be needed. Overdosage: If you think you have taken too much of this medicine contact a poison control center or emergency room at once. NOTE: This medicine is only for you. Do not share this medicine with others. What if I miss a dose? This does not apply. What may interact with this medicine? -chemotherapy or radiation therapy -medicines that lower your immune system like etanercept, anakinra, infliximab, and adalimumab -medicines that treat or prevent blood clots like warfarin -phenytoin -steroid medicines like prednisone or  cortisone -theophylline -vaccines This list may not describe all possible interactions. Give your health care provider a list of all the medicines, herbs, non-prescription drugs, or dietary supplements you use. Also tell them if you smoke, drink alcohol, or use illegal drugs. Some items may interact with your medicine. What should I watch for while using this medicine? Report any side effects that do not go away within 3 days to your doctor or health care professional. Call your health care provider if any unusual symptoms occur within 6 weeks of receiving this vaccine. You may still catch the flu, but the illness is not usually as bad. You cannot get the flu from the vaccine. The vaccine will not protect against colds or other illnesses that may cause fever. The vaccine is needed every year. What side effects may I notice from receiving this medicine? Side effects that you should report to your doctor or health care professional as soon as possible: -allergic reactions like skin rash, itching or hives, swelling of the face, lips, or tongue Side effects that usually do not require medical attention (report to your doctor or health care professional if they continue or are bothersome): -fever -headache -muscle aches and pains -pain, tenderness, redness, or swelling at site where injected -weak or tired This list may not describe all possible side effects. Call your doctor for medical advice about side effects. You may report side effects to FDA at 1-800-FDA-1088. Where should I keep my medicine? This vaccine is only given in a clinic, pharmacy, doctor's office, or other health care setting and will not be stored at home. NOTE: This sheet is a summary. It may not cover all possible information. If you have questions about this medicine, talk to your doctor,  pharmacist, or health care provider.  2018 Elsevier/Gold Standard (2008-03-11 09:30:40)

## 2018-06-04 NOTE — Progress Notes (Signed)
Patient ID: George Griffin, male    DOB: 08-30-1960  MRN: [id number]  CC: Diabetes   Subjective: George Griffin is a 57 y.o. male who presents for follow-up visit.  concerns today include:  Hx of DM with neuropathy, lung CA s/p chem/XRT HL, HTN, CAD/ICM s/p CABG x 4 with EF 30-35%, PAF  DM: reports BS have been good.  Checks Q 2-3 days in mornings.  He has log with him.  Range 66-117.  Does not eat a big dinner.  Reports compliance with Lantus 63 units BID, Novolog 24 units with meals and Metformin 500 mg BID.   HL/CAD/HTN:  Taking Lipitor 40 mg as prescribed.  Has appt with cardiologist tomorrow. Does not have Digoxin or Lisinopril with him today.  States he is not taking.   Saw his spine surgeon yesterday at [facility].  Had inj to his back   Would like to move forward with getting teeth extractions done.  His dentist requested that his A1c be at least 8 or lower.  Patient Active Problem List   Diagnosis Date Noted  . Microalbuminuria 09/05/2017  . S/P CABG x 4 03/23/2017  . Coronary artery disease involving native coronary artery of native heart with angina pectoris ([place]) 03/17/2017  . Ischemic cardiomyopathy 03/17/2017  . Non-STEMI (non-ST elevated myocardial infarction) ([place]) 03/16/2017  . Acquired hypothyroidism 10/05/2015  . Poor social situation 10/05/2015  . Weight loss, unintentional 10/05/2015  . Homeless 04/06/2014  . History of lung cancer 08/08/2011  . DM2 (diabetes mellitus, type 2) ([place]) 08/03/2011  . Hyperlipidemia with target low density lipoprotein (LDL) cholesterol less than 70 mg/dL   . Diabetic neuropathy, type II diabetes mellitus ([place])   . Hepatic steatosis      Current Outpatient Medications on File Prior to Visit  Medication Sig Dispense Refill  . ACCU-CHEK SOFTCLIX LANCETS lancets Use as instructed 100 each 12  . aspirin EC 325 MG EC tablet Take 1 tablet (325 mg total) by mouth daily. 30 tablet 0  . atorvastatin (LIPITOR) 80 MG tablet  Take 0.5 tablets (40 mg total) by mouth daily at 6 PM. 45 tablet 3  . Blood Glucose Monitoring Suppl (ACCU-CHEK AVIVA) device Use as instructed 1 each 0  . carvedilol (COREG) 12.5 MG tablet Take 1 tablet (12.5 mg total) by mouth 2 (two) times daily with a meal. 180 tablet 3  . furosemide (LASIX) 20 MG tablet 1 tab PO daily as needed for swelling in legs 30 tablet 2  . gabapentin (NEURONTIN) 300 MG capsule Take 1 capsule (300 mg total) by mouth 3 (three) times daily. 270 capsule 3  . glucose blood (ACCU-CHEK AVIVA PLUS) test strip Use as instructed 100 each 12  . insulin aspart (NOVOLOG) 100 UNIT/ML injection Inject 24 units into the skin 3 times daily with meals. 30 mL 3   No current facility-administered medications on file prior to visit.     Allergies  Allergen Reactions  . No Known Allergies     Social History   Socioeconomic History  . Marital status: Married    Spouse name: Not on file  . Number of children: Not on file  . Years of education: Not on file  . Highest education level: Not on file  Occupational History    Employer: UNEMPLOYED  Social Needs  . Financial resource strain: Not on file  . Food insecurity:    Worry: Not on file    Inability: Not on file  .  Transportation needs:    Medical: Not on file    Non-medical: Not on file  Tobacco Use  . Smoking status: Former Research scientist (life sciences)  . Smokeless tobacco: Never Used  Substance and Sexual Activity  . Alcohol use: No  . Drug use: No  . Sexual activity: Not on file  Lifestyle  . Physical activity:    Days per week: Not on file    Minutes per session: Not on file  . Stress: Not on file  Relationships  . Social connections:    Talks on phone: Not on file    Gets together: Not on file    Attends religious service: Not on file    Active member of club or organization: Not on file    Attends meetings of clubs or organizations: Not on file    Relationship status: Not on file  . Intimate partner violence:    Fear of  current or ex partner: Not on file    Emotionally abused: Not on file    Physically abused: Not on file    Forced sexual activity: Not on file  Other Topics Concern  . Not on file  Social History Narrative  . Not on file    Family History  Problem Relation Age of Onset  . CAD Neg Hx   . Hypertension Neg Hx   . Diabetes Neg Hx     Past Surgical History:  Procedure Laterality Date  . CORONARY ARTERY BYPASS GRAFT N/A 03/23/2017   Procedure: CORONARY ARTERY BYPASS GRAFTING (CABG) x , four using left internal mammary artery and right greater saphenous vein harvested endoscopically;  Surgeon: Ivin Poot, MD;  Location: [place];  Service: Open Heart Surgery;  Laterality: N/A;  . ENDOVEIN HARVEST OF GREATER SAPHENOUS VEIN Right 03/23/2017   Procedure: ENDOVEIN HARVEST OF GREATER SAPHENOUS VEIN;  Surgeon: Ivin Poot, MD;  Location: [place];  Service: Open Heart Surgery;  Laterality: Right;  . LEFT HEART CATH AND CORONARY ANGIOGRAPHY Left 03/16/2017   Procedure: Left Heart Cath and Coronary Angiography;  Surgeon: Isaias Cowman, MD;  Location: [place];  Service: Cardiovascular;  Laterality: Left;  . none    . RIGHT HEART CATH N/A 03/21/2017   Procedure: Right Heart Cath;  Surgeon: Larey Dresser, MD;  Location: [place] [facility];  Service: Cardiovascular;  Laterality: N/A;  . TEE WITHOUT CARDIOVERSION N/A 03/23/2017   Procedure: TRANSESOPHAGEAL ECHOCARDIOGRAM (TEE);  Surgeon: Prescott Gum, Collier Salina, MD;  Location: [place];  Service: Open Heart Surgery;  Laterality: N/A;    ROS: Review of Systems Negative except as above. PHYSICAL EXAM: BP (!) 148/88   Pulse 85   Temp 97.7 F (36.5 C) (Oral)   Resp 16   Wt 181 lb 12.8 oz (82.5 kg)   SpO2 98%   BMI 28.47 kg/m   Physical Exam  General appearance - alert, well appearing, and in no distress Mental status - normal mood, behavior, speech, dress, motor activity, and thought processes Mouth - mucous membranes moist,  pharynx normal without lesions Chest - clear to auscultation, no wheezes, rales or rhonchi, symmetric air entry Heart - normal rate, regular rhythm, normal S1, S2, no murmurs, rubs, clicks or gallops  Results for orders placed or performed in visit on 06/04/18  POCT glucose (manual entry)  Result Value Ref Range   POC Glucose 185 (A) 70 - 99 mg/dl  POCT glycosylated hemoglobin (Hb A1C)  Result Value Ref Range   Hemoglobin A1C  HbA1c POC (<> result, manual entry)     HbA1c, POC (prediabetic range)     HbA1c, POC (controlled diabetic range) 10.0 (A) 0.0 - 7.0 %   A1C 10.0  ASSESSMENT AND PLAN: 1. Type 2 diabetes mellitus with diabetic neuropathy, with long-term current use of insulin (HCC) Not at goal.  Advised to increase metformin to 1 g twice a day.  Since he is having some morning hypoglycemia we have changed Lantus to 63 units in the morning and 60 units at night. -Patient to follow-up with clinical pharmacist in about a month.  If his blood sugars are better we will go ahead and approve him for his dental procedure. - POCT glucose (manual entry) - POCT glycosylated hemoglobin (Hb A1C) - metFORMIN (GLUCOPHAGE) 1000 MG tablet; Take 1 tablet (1,000 mg total) by mouth 2 (two) times daily with a meal.  Dispense: 60 tablet; Refill: 6 - insulin glargine (LANTUS) 100 UNIT/ML injection; 63 units subcut in a.m and 60 units in p.m.  Dispense: 30 mL; Refill: 6 - Ambulatory referral to Ophthalmology  2. Coronary artery disease involving coronary bypass graft of native heart without angina pectoris I have printed the prescription for lisinopril and gave to him to have filled.  We will keep his appointment with the cardiologist tomorrow.  I advised that he take all of his medicines with him to that visit. - lisinopril (PRINIVIL,ZESTRIL) 5 MG tablet; Take 0.5 tablets (2.5 mg total) by mouth daily.  Dispense: 45 tablet; Refill: 3  3. Essential hypertension Not at goal.  Lisinopril added as  intended.  4. Hyperlipidemia, unspecified hyperlipidemia type Continue atorvastatin.  His LDL was not at goal on last check but we were not sure whether he was taking the Lipitor consistently at that time.  5. Need for immunization against influenza - Flu Vaccine QUAD 36+ mos IM   Patient was given the opportunity to ask questions.  Patient verbalized understanding of the plan and was able to repeat key elements of the plan.   Orders Placed This Encounter  Procedures  . Flu Vaccine QUAD 36+ mos IM  . Ambulatory referral to Ophthalmology  . POCT glucose (manual entry)  . POCT glycosylated hemoglobin (Hb A1C)     Requested Prescriptions   Signed Prescriptions Disp Refills  . lisinopril (PRINIVIL,ZESTRIL) 5 MG tablet 45 tablet 3    Sig: Take 0.5 tablets (2.5 mg total) by mouth daily.  . metFORMIN (GLUCOPHAGE) 1000 MG tablet 60 tablet 6    Sig: Take 1 tablet (1,000 mg total) by mouth 2 (two) times daily with a meal.  . insulin glargine (LANTUS) 100 UNIT/ML injection 30 mL 6    Sig: 63 units subcut in a.m and 60 units in p.m.    Return in about 3 months (around 09/04/2018).  Karle Plumber, MD, FACP

## 2018-06-05 MED FILL — metFORMIN HCL 1000 MG TABS: 1000 | 90 days supply | Qty: 180 | Fill #0

## 2018-06-06 DIAGNOSIS — I255 Ischemic cardiomyopathy: Secondary | ICD-10-CM | POA: Diagnosis not present

## 2018-06-06 DIAGNOSIS — I214 Non-ST elevation (NSTEMI) myocardial infarction: Secondary | ICD-10-CM | POA: Diagnosis not present

## 2018-06-10 ENCOUNTER — Encounter: Payer: Self-pay | Admitting: Internal Medicine

## 2018-06-10 DIAGNOSIS — Z794 Long term (current) use of insulin: Secondary | ICD-10-CM | POA: Diagnosis not present

## 2018-06-10 DIAGNOSIS — E119 Type 2 diabetes mellitus without complications: Secondary | ICD-10-CM | POA: Diagnosis not present

## 2018-06-10 LAB — HM DIABETES EYE EXAM

## 2018-07-04 ENCOUNTER — Encounter: Payer: Self-pay | Admitting: Pharmacist

## 2018-07-05 ENCOUNTER — Encounter: Payer: Self-pay | Admitting: Pharmacist

## 2018-07-05 ENCOUNTER — Ambulatory Visit: Payer: Medicare Other | Attending: Internal Medicine | Admitting: Pharmacist

## 2018-07-05 DIAGNOSIS — Z87891 Personal history of nicotine dependence: Secondary | ICD-10-CM | POA: Diagnosis not present

## 2018-07-05 DIAGNOSIS — E11649 Type 2 diabetes mellitus with hypoglycemia without coma: Secondary | ICD-10-CM | POA: Insufficient documentation

## 2018-07-05 DIAGNOSIS — Z794 Long term (current) use of insulin: Secondary | ICD-10-CM | POA: Diagnosis not present

## 2018-07-05 DIAGNOSIS — I25810 Atherosclerosis of coronary artery bypass graft(s) without angina pectoris: Secondary | ICD-10-CM

## 2018-07-05 DIAGNOSIS — I214 Non-ST elevation (NSTEMI) myocardial infarction: Secondary | ICD-10-CM | POA: Diagnosis not present

## 2018-07-05 DIAGNOSIS — I255 Ischemic cardiomyopathy: Secondary | ICD-10-CM | POA: Insufficient documentation

## 2018-07-05 DIAGNOSIS — E1140 Type 2 diabetes mellitus with diabetic neuropathy, unspecified: Secondary | ICD-10-CM | POA: Diagnosis not present

## 2018-07-05 DIAGNOSIS — I25119 Atherosclerotic heart disease of native coronary artery with unspecified angina pectoris: Secondary | ICD-10-CM | POA: Diagnosis not present

## 2018-07-05 LAB — GLUCOSE, POCT (MANUAL RESULT ENTRY): POC Glucose: 127 mg/dl — AB (ref 70–99)

## 2018-07-05 MED ORDER — EMPAGLIFLOZIN 10 MG PO TABS: 10.0000 mg | ORAL_TABLET | Freq: Every day | ORAL | 0 refills | Status: DC

## 2018-07-05 MED ORDER — CARVEDILOL 12.5 MG PO TABS: 12.5000 mg | ORAL_TABLET | Freq: Two times a day (BID) | ORAL | 0 refills | Status: DC

## 2018-07-05 MED ORDER — FUROSEMIDE 20 MG PO TABS: ORAL_TABLET | ORAL | 0 refills | Status: DC

## 2018-07-05 MED ORDER — LISINOPRIL 5 MG PO TABS: 2.5000 mg | ORAL_TABLET | Freq: Every day | ORAL | 2 refills | Status: DC

## 2018-07-05 MED ORDER — INSULIN ASPART 100 UNIT/ML ~~LOC~~ SOLN: SUBCUTANEOUS | 2 refills | Status: DC

## 2018-07-05 MED ORDER — INSULIN GLARGINE 100 UNIT/ML ~~LOC~~ SOLN: SUBCUTANEOUS | 6 refills | Status: DC

## 2018-07-05 MED ORDER — METFORMIN HCL 1000 MG PO TABS: 1000.0000 mg | ORAL_TABLET | Freq: Two times a day (BID) | ORAL | 2 refills | Status: DC

## 2018-07-05 MED ORDER — ATORVASTATIN CALCIUM 80 MG PO TABS: 40.0000 mg | ORAL_TABLET | Freq: Every day | ORAL | 0 refills | Status: DC

## 2018-07-05 NOTE — Progress Notes (Signed)
   S:    PCP: Dr. Wynetta Emery   No chief complaint on file.  Patient arrives in good spirits.  Presents for diabetes management at the request of Dr. Wynetta Emery. Patient was referred and last seen by PCP on 06/04/18.  Reported AM hypoglycemia. Dr. Wynetta Emery decreased Lantus.  Family/Social History:  - Former smoker: quit date - pt reports quitting ~10 yrs ago.  - Alcohol: reports 1 beer every once in a while - FHx: pertinent negatives include CAD, HTN, DM  Insurance coverage/medication affordability:  - [employer] Gold  Patient reports adherence with insulin but he is taking differently than prescribed Current diabetes medications include:  - Lantus 63 units qAM and 60 units qPM. Still taking 63 units BID.  - Novolog 24 units TID  - Metformin 1000 mg BID  Patient reports relative hypoglycemia in the morning. No objective values < 70 on home sugar log.   Patient reported dietary habits:  - 3 meals/day - Morning: clear soup with vegetables - Lunch/Dinner: brown rice, vegetables, and fish - Denies sodas, sugary drinks, and cookies  Patient-reported exercise habits:  - back pain significantly improved - reports "working out at home"    Pt reports polydipsia, polyphagia.  Pt reports nocturia.  Pt reports baseline neuropathy. Pt denies vision changes.  O:  POCT glucose: 127 Home morning levels: 98 - 152 Home evening levels (post-prandial): 100 - 150  Lab Results  Component Value Date   HGBA1C 10.0 (A) 06/04/2018      Component Value Date/Time   CHOL 192 04/04/2018 0910   TRIG 152 (H) 04/04/2018 0910   HDL 38 (L) 04/04/2018 0910   CHOLHDL 5.1 (H) 04/04/2018 0910   CHOLHDL 4.4 03/18/2017 0428   VLDL 37 03/18/2017 0428   LDLCALC 124 (H) 04/04/2018 0910   Clinical ASCVD: Yes - patient with DM and non-ST elevated MI, CAD with angina  A/P: Diabetes longstanding currently uncontrolled based on last A1c of 10.0. Pt is able to verbalize appropriate hypoglycemia plan. He is adherent to  medications.   Patient has significantly improved his diet. He continues to endorse symptoms of relative hypoglycemia. He has no objective values < 70. Of note, his home sugar levels are much improved but his last A1c remains above goal. He is a significant ASCVD patient with MI hx. Will add Jardiance for ASCVD benefit. Will decrease Lantus to 60 units BID.   - Start Jardiance 10 mg.  - Decrease Lantus to 60 units BID.  - Continue RA insulin 24 units TID with meals.  - Continue metformin 1000 mg BID.  - Will send 3 month supply for medications as patient is going out of twon - Recommended lifestyle interventions, dietary effects on glycemic control - Counseled on s/sx of and management of hypoglycemia - Next A1C anticipated 08/2018  ASCVD risk: Secondary prevention in patient with DM and history of non-ST elevated MI, CAD with angina, and ischemic cardiomyopathy.  LDL goal < 70. High intensity statin indicated.  -Continued atorvastatin 40 mg (1/2 of 80 mg tablet).   Dixon Boos, PharmD Candidate [employer] of 2021  Benard Halsted, PharmD, [place] [phone]

## 2018-07-05 NOTE — Patient Instructions (Addendum)
C?m ?n b?n ? ??n g?p ti hm nay. Vui lng lm nh? sau:  Atorvastatin 80 mg: 1/2 vin m?i ngy  Carvedilol 12,5 mg: 1 vin 2 l?n m?t ngy.  Jardiance 10 mg: 1 vin m?i ngy.  Furosemide 20 mg: 1 vin khi c?n thi?t cho s?ng.  Novolog: 24 ??n v? 3 l?n m?t ngy v?i b?a ?n  Lantus: 60 ??n v? 2 l?n m?t ngy  Lisinopril 5 mg: 1/2 vin m?i ngy  Metformin 1000 mg: 1 vin 2 l?n m?t ngy.   2. Ti?p t?c ki?m tra ???ng mu t?i nh.  3. Ti?p t?c th?c hi?n cc thay ??i l?i s?ng m chng ti ? th?o lu?n cng nhau trong chuy?n th?m c?a chng ti. Ch? ?? ?n u?ng v t?p th? d?c ?ng m?t vai tr quan tr?ng trong vi?c c?i thi?n l??ng ???ng trong mu c?a b?n.  4. Theo di v?i PCP trong 09/06/18.  H? ???ng huy?t ho?c l??ng ???ng trong mu th?p:   L??ng ???ng trong mu th?p c th? x?y ra nhanh chng v c th? tr? thnh m?t tr??ng h?p kh?n c?p n?u khng ???c ?i?u tr? ngay l?p t?c.   M?c d ?i?u ny khng nn x?y ra th??ng xuyn, nh?ng n c th? ???c ??a ra n?u b?n b? b?a ho?c khng ?n ??Darlin Coco ra, n?u insulin ho?c thu?c tr? ti?u ???ng khc c?a b?n ???c dng qu li?u, ?i?u ny c th? khi?n l??ng ???ng trong mu c?a b?n xu?ng th?p.   D?u hi?u c?nh bo l??ng ???ng trong mu th?p bao g?m: C?m th?y run r?y ho?c chng m?t C?m th?y y?u ?u?i ho?c m?t m?i ?i qu C?m th?y lo l?ng ho?c bu?n b ?? m? hi ngay c? khi b?n khng t?p th? d?c   Ph?i lm g n?u ti b? h? ???ng huy?t? Ki?m tra l??ng ???ng trong mu c?a b?n v?i my ?o c?a b?n. N?u th?p h?n 70, ti?n hnh b??c 2. ?i?u tr? b?ng 3-4 vin glucose ho?c 3 gi ???ng thng th??ng. N?u nh?ng th? ny khng c ? xung quanh, b?n c th? th? k?o c?ng. Tuy nhin, m?t l?a ch?n khc l u?ng 4 ounce n??c p tri cy ho?c 6 ounce soda REGULAR.   Ki?m tra [place] ???ng c?a b?n trong 15 pht. N?u n v?n d??i 70, hy lm l?i nh?ng g b?n ? lm ? b??c 2. N?u ? tr? l?i, hy ti?p t?c v ?n m?t b?a ?n nh? ho?c b?a ?n nh? t?i th?i ?i?m ny.   English Translation:   List of  medications  2. Continue checking sugars at home.   3. Continue making lifestyle changes.   4. Follow-up with Dr. Wynetta Emery.  Hypoglycemia or low blood sugar:   Low blood sugar can happen quickly and may become an emergency if not treated right away.   While this shouldn't happen often, it can be brought upon if you skip a meal or do not eat enough. Also, if your insulin or other diabetes medications are dosed too high, this can cause your blood sugar to go to low.   Warning signs of low blood sugar include: 1. Feeling shaky or dizzy 2. Feeling weak or tired  3. Excessive hunger 4. Feeling anxious or upset  5. Sweating even when you aren't exercising  What to do if I experience low blood sugar? 1. Check your blood sugar with your meter. If lower than 70, proceed to step 2.  2. Treat with 3-4 glucose tablets or 3 packets of  regular sugar. If these aren't around, you can try hard candy. Yet another option would be to drink 4 ounces of fruit juice or 6 ounces of REGULAR soda.  3. Re-check your sugar in 15 minutes. If it is still below 70, do what you did in step 2 again. If has come back up, go ahead and eat a snack or small meal at this time.

## 2018-08-17 DIAGNOSIS — Z23 Encounter for immunization: Secondary | ICD-10-CM | POA: Diagnosis not present

## 2018-09-05 ENCOUNTER — Ambulatory Visit: Payer: Medicare Other | Admitting: Internal Medicine

## 2018-09-05 DIAGNOSIS — M5440 Lumbago with sciatica, unspecified side: Secondary | ICD-10-CM | POA: Diagnosis not present

## 2018-09-05 DIAGNOSIS — M545 Low back pain: Secondary | ICD-10-CM | POA: Diagnosis not present

## 2018-09-06 ENCOUNTER — Ambulatory Visit: Payer: Medicare Other | Attending: Internal Medicine | Admitting: Internal Medicine

## 2018-09-06 ENCOUNTER — Encounter: Payer: Self-pay | Admitting: Internal Medicine

## 2018-09-06 VITALS — BP 120/70 | HR 91 | Temp 97.7°F | Resp 16 | Ht 66.0 in | Wt 185.2 lb

## 2018-09-06 DIAGNOSIS — Z794 Long term (current) use of insulin: Secondary | ICD-10-CM | POA: Insufficient documentation

## 2018-09-06 DIAGNOSIS — E785 Hyperlipidemia, unspecified: Secondary | ICD-10-CM | POA: Insufficient documentation

## 2018-09-06 DIAGNOSIS — I480 Paroxysmal atrial fibrillation: Secondary | ICD-10-CM | POA: Insufficient documentation

## 2018-09-06 DIAGNOSIS — C3401 Malignant neoplasm of right main bronchus: Secondary | ICD-10-CM

## 2018-09-06 DIAGNOSIS — E118 Type 2 diabetes mellitus with unspecified complications: Secondary | ICD-10-CM | POA: Insufficient documentation

## 2018-09-06 DIAGNOSIS — K760 Fatty (change of) liver, not elsewhere classified: Secondary | ICD-10-CM | POA: Diagnosis not present

## 2018-09-06 DIAGNOSIS — Z7984 Long term (current) use of oral hypoglycemic drugs: Secondary | ICD-10-CM | POA: Insufficient documentation

## 2018-09-06 DIAGNOSIS — Z87891 Personal history of nicotine dependence: Secondary | ICD-10-CM | POA: Diagnosis not present

## 2018-09-06 DIAGNOSIS — E1140 Type 2 diabetes mellitus with diabetic neuropathy, unspecified: Secondary | ICD-10-CM | POA: Diagnosis not present

## 2018-09-06 DIAGNOSIS — Z79899 Other long term (current) drug therapy: Secondary | ICD-10-CM | POA: Diagnosis not present

## 2018-09-06 DIAGNOSIS — Z8249 Family history of ischemic heart disease and other diseases of the circulatory system: Secondary | ICD-10-CM | POA: Diagnosis not present

## 2018-09-06 DIAGNOSIS — I2510 Atherosclerotic heart disease of native coronary artery without angina pectoris: Secondary | ICD-10-CM

## 2018-09-06 DIAGNOSIS — I255 Ischemic cardiomyopathy: Secondary | ICD-10-CM | POA: Insufficient documentation

## 2018-09-06 DIAGNOSIS — Z7982 Long term (current) use of aspirin: Secondary | ICD-10-CM | POA: Diagnosis not present

## 2018-09-06 DIAGNOSIS — Z951 Presence of aortocoronary bypass graft: Secondary | ICD-10-CM | POA: Diagnosis not present

## 2018-09-06 DIAGNOSIS — I252 Old myocardial infarction: Secondary | ICD-10-CM | POA: Diagnosis not present

## 2018-09-06 DIAGNOSIS — I10 Essential (primary) hypertension: Secondary | ICD-10-CM | POA: Diagnosis not present

## 2018-09-06 DIAGNOSIS — E039 Hypothyroidism, unspecified: Secondary | ICD-10-CM | POA: Insufficient documentation

## 2018-09-06 DIAGNOSIS — E1165 Type 2 diabetes mellitus with hyperglycemia: Secondary | ICD-10-CM

## 2018-09-06 DIAGNOSIS — IMO0002 Reserved for concepts with insufficient information to code with codable children: Secondary | ICD-10-CM

## 2018-09-06 LAB — POCT GLYCOSYLATED HEMOGLOBIN (HGB A1C): HbA1c, POC (controlled diabetic range): 9.8 % — AB (ref 0.0–7.0)

## 2018-09-06 LAB — GLUCOSE, POCT (MANUAL RESULT ENTRY): POC Glucose: 223 mg/dl — AB (ref 70–99)

## 2018-09-06 MED ORDER — ATORVASTATIN CALCIUM 80 MG PO TABS: 40.0000 mg | ORAL_TABLET | Freq: Every day | ORAL | 3 refills | Status: DC

## 2018-09-06 MED ORDER — EMPAGLIFLOZIN 10 MG PO TABS: 10.0000 mg | ORAL_TABLET | Freq: Every day | ORAL | 1 refills | Status: DC

## 2018-09-06 NOTE — Progress Notes (Signed)
Patient ID: George Griffin, adult    DOB: [birth year]  MRN: [id number]  CC: Diabetes and Hypertension   Subjective: George Griffin is a 58 y.o. adult who presents for chronic disease management. Her concerns today include:  Hx of DM with neuropathy, lung CA s/p chem/XRT HL, HTN, CAD/ICM s/p CABG x 4 with EF 30-35%, PAF  DM: Patient checks blood sugars twice a day before meals.  He has log with him today.  Blood sugars before breakfast ranging 100-155 and before dinner 98-160. Marland Kitchen No hypoglycemia Saw clinical pharmacist since last visit with me.  Jardiance was added.  However patient states that the pharmacy did not give him the medication.  He is compliant with Lantus, NovoLog and metformin.  Reports he is doing well with his eating habits.  He would like to move forward with getting teeth extracted so that he can get dentures.  CAD/HTN:  Saw cardiology 3 mths ago.  Reports no changes made to medications.  He denies any chest pains or shortness of breath at rest on exertion. No lower extremity edema.  Had PAF at one time but this is no longer an issue and he is not on any blood thinner.  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  Treated with chemo and radiation.  Last saw Dr. Earl Lites in 2017 at which point he was more than 5 years out without recurrence.  Patient denies any shortness of breath, chronic cough or hemoptysis at this time. Patient Active Problem List   Diagnosis Date Noted  . Paroxysmal atrial fibrillation ([place]) 09/06/2018  . Malignant neoplasm of hilus of right lung ([place]) 09/06/2018  . Microalbuminuria 09/05/2017  . S/P CABG x 4 03/23/2017  . Coronary artery disease involving native coronary artery of native heart with angina pectoris ([place]) 03/17/2017  . Ischemic cardiomyopathy 03/17/2017  . Non-STEMI (non-ST elevated myocardial infarction) ([place]) 03/16/2017  . Acquired hypothyroidism 10/05/2015  . Poor social situation 10/05/2015  . Weight loss, unintentional  10/05/2015  . Homeless 04/06/2014  . History of lung cancer 08/08/2011  . DM2 (diabetes mellitus, type 2) ([place]) 08/03/2011  . Hyperlipidemia with target low density lipoprotein (LDL) cholesterol less than 70 mg/dL   . Diabetic neuropathy, type II diabetes mellitus ([place])   . Hepatic steatosis      Current Outpatient Medications on File Prior to Visit  Medication Sig Dispense Refill  . ACCU-CHEK SOFTCLIX LANCETS lancets Use as instructed 100 each 12  . aspirin EC 325 MG EC tablet Take 1 tablet (325 mg total) by mouth daily. 30 tablet 0  . Blood Glucose Monitoring Suppl (ACCU-CHEK AVIVA) device Use as instructed 1 each 0  . carvedilol (COREG) 12.5 MG tablet Take 1 tablet (12.5 mg total) by mouth 2 (two) times daily with a meal. 180 tablet 0  . furosemide (LASIX) 20 MG tablet 1 tab PO daily as needed for swelling in legs 90 tablet 0  . gabapentin (NEURONTIN) 300 MG capsule Take 1 capsule (300 mg total) by mouth 3 (three) times daily. 270 capsule 3  . glucose blood (ACCU-CHEK AVIVA PLUS) test strip Use as instructed 100 each 12  . insulin aspart (NOVOLOG) 100 UNIT/ML injection Inject 24 units into the skin 3 times daily with meals. 70 mL 2  . insulin glargine (LANTUS) 100 UNIT/ML injection 60 units subcut in a.m and 60 units in p.m. 30 mL 6  . lisinopril (PRINIVIL,ZESTRIL) 5 MG tablet Take 0.5 tablets (2.5 mg total) by  mouth daily. 45 tablet 2  . metFORMIN (GLUCOPHAGE) 1000 MG tablet Take 1 tablet (1,000 mg total) by mouth 2 (two) times daily with a meal. 180 tablet 2   No current facility-administered medications on file prior to visit.     Allergies  Allergen Reactions  . No Known Allergies     Social History   Socioeconomic History  . Marital status: Married    Spouse name: Not on file  . Number of children: Not on file  . Years of education: Not on file  . Highest education level: Not on file  Occupational History    Employer: UNEMPLOYED  Social Needs  . Financial resource  strain: Not on file  . Food insecurity:    Worry: Not on file    Inability: Not on file  . Transportation needs:    Medical: Not on file    Non-medical: Not on file  Tobacco Use  . Smoking status: Former Smoker    Last attempt to quit: 05/26/2008    Years since quitting: 10.2  . Smokeless tobacco: Never Used  Substance and Sexual Activity  . Alcohol use: Yes    Comment: 1 can of beer occasionally  . Drug use: No  . Sexual activity: Not on file  Lifestyle  . Physical activity:    Days per week: Not on file    Minutes per session: Not on file  . Stress: Not on file  Relationships  . Social connections:    Talks on phone: Not on file    Gets together: Not on file    Attends religious service: Not on file    Active member of club or organization: Not on file    Attends meetings of clubs or organizations: Not on file    Relationship status: Not on file  . Intimate partner violence:    Fear of current or ex partner: Not on file    Emotionally abused: Not on file    Physically abused: Not on file    Forced sexual activity: Not on file  Other Topics Concern  . Not on file  Social History Narrative  . Not on file    Family History  Problem Relation Age of Onset  . CAD Neg Hx   . Hypertension Neg Hx   . Diabetes Neg Hx     Past Surgical History:  Procedure Laterality Date  . CORONARY ARTERY BYPASS GRAFT N/A 03/23/2017   Procedure: CORONARY ARTERY BYPASS GRAFTING (CABG) x , four using left internal mammary artery and right greater saphenous vein harvested endoscopically;  Surgeon: Ivin Poot, MD;  Location: [facility];  Service: Open Heart Surgery;  Laterality: N/A;  . ENDOVEIN HARVEST OF GREATER SAPHENOUS VEIN Right 03/23/2017   Procedure: ENDOVEIN HARVEST OF GREATER SAPHENOUS VEIN;  Surgeon: Ivin Poot, MD;  Location: [facility];  Service: Open Heart Surgery;  Laterality: Right;  . LEFT HEART CATH AND CORONARY ANGIOGRAPHY Left 03/16/2017   Procedure: Left Heart Cath and  Coronary Angiography;  Surgeon: Isaias Cowman, MD;  Location: [facility];  Service: Cardiovascular;  Laterality: Left;  . none    . RIGHT HEART CATH N/A 03/21/2017   Procedure: Right Heart Cath;  Surgeon: Larey Dresser, MD;  Location: [facility];  Service: Cardiovascular;  Laterality: N/A;  . TEE WITHOUT CARDIOVERSION N/A 03/23/2017   Procedure: TRANSESOPHAGEAL ECHOCARDIOGRAM (TEE);  Surgeon: Prescott Gum, Collier Salina, MD;  Location: [facility];  Service: Open Heart Surgery;  Laterality:  N/A;    ROS: Review of Systems Neg except as above PHYSICAL EXAM: BP 120/70   Pulse 91   Temp 97.7 F (36.5 C) (Oral)   Resp 16   Ht 5\' 6"  (1.676 m)   Wt 185 lb 3.2 oz (84 kg)   SpO2 99%   BMI 29.89 kg/m   Wt Readings from Last 3 Encounters:  09/06/18 185 lb 3.2 oz (84 kg)  06/04/18 181 lb 12.8 oz (82.5 kg)  04/04/18 178 lb 12.8 oz (81.1 kg)    Physical Exam  General appearance - alert, well appearing, and in no distress Mental status - normal mood, behavior, speech, dress, motor activity, and thought processes Neck - supple, no significant adenopathy Chest - clear to auscultation, no wheezes, rales or rhonchi, symmetric air entry Heart - normal rate, regular rhythm, normal S1, S2, no murmurs, rubs, clicks or gallops Extremities - peripheral pulses normal, no pedal edema, no clubbing or cyanosis  Results for orders placed or performed in visit on 09/06/18  POCT glucose (manual entry)  Result Value Ref Range   POC Glucose 223 (A) 70 - 99 mg/dl  POCT glycosylated hemoglobin (Hb A1C)  Result Value Ref Range   Hemoglobin A1C     HbA1c POC (<> result, manual entry)     HbA1c, POC (prediabetic range)     HbA1c, POC (controlled diabetic range) 9.8 (A) 0.0 - 7.0 %    ASSESSMENT AND PLAN: 1. Diabetes mellitus type 2, uncontrolled, with complications (HCC) W1U not at goal but improving.  His fasting blood sugars are good.  I have printed the prescription for the Jardiance and  given it to him and he can feel that he can take it to his pharmacy to get filled. - POCT glucose (manual entry) - POCT glycosylated hemoglobin (Hb A1C) - Microalbumin / creatinine urine ratio - empagliflozin (JARDIANCE) 10 MG TABS tablet; Take 10 mg by mouth daily.  Dispense: 90 tablet; Refill: 1  2. Paroxysmal atrial fibrillation (HCC) Not an issue and patient not on blood thinner.  I think this occurred in the post MI.  3. Malignant neoplasm of hilus of right lung ([place]) No signs or symptoms of recurrence at this time.  Patient does not smoke.  4. Essential hypertension At goal.  Continue current medications and low-salt diet.  5. Hx of CABG 6. Coronary artery disease involving native coronary artery of native heart without angina pectoris Clinically stable.  Followed by cardiology. - atorvastatin (LIPITOR) 80 MG tablet; Take 0.5 tablets (40 mg total) by mouth daily at 6 PM.  Dispense: 45 tablet; Refill: 3     Patient was given the opportunity to ask questions.  Patient verbalized understanding of the plan and was able to repeat key elements of the plan.   Orders Placed This Encounter  Procedures  . Microalbumin / creatinine urine ratio  . POCT glucose (manual entry)  . POCT glycosylated hemoglobin (Hb A1C)     Requested Prescriptions   Signed Prescriptions Disp Refills  . atorvastatin (LIPITOR) 80 MG tablet 45 tablet 3    Sig: Take 0.5 tablets (40 mg total) by mouth daily at 6 PM.  . empagliflozin (JARDIANCE) 10 MG TABS tablet 90 tablet 1    Sig: Take 10 mg by mouth daily.    Return in about 3 months (around 12/06/2018).  Karle Plumber, MD, FACP

## 2018-09-07 LAB — MICROALBUMIN / CREATININE URINE RATIO
Creatinine, Urine: 91.7 mg/dL
MICROALB/CREAT RATIO: 176.8 mg/g{creat} — AB (ref 0.0–30.0)
Microalbumin, Urine: 162.1 ug/mL

## 2018-09-08 ENCOUNTER — Encounter: Payer: Self-pay | Admitting: Internal Medicine

## 2018-09-08 NOTE — Progress Notes (Signed)
Pt with persistent microalbuminuria. Will increase Lisinopril dose on next visit.

## 2018-09-11 ENCOUNTER — Telehealth: Payer: Self-pay | Admitting: Internal Medicine

## 2018-09-11 NOTE — Telephone Encounter (Signed)
Pt a1c is 9.8 on last ov will contact office to make them aware

## 2018-09-11 NOTE — Telephone Encounter (Signed)
Minh at  [facility] in [place]   Fax [phone] ph [phone]. States spoke with [place] and we faxed back the sx clearance form for pt to have teeth extractions.  The form was denied due to HCA1c on letter.  Please inform if ok to proceed now.

## 2018-09-12 NOTE — Telephone Encounter (Signed)
Returned call to [facility] in [place] and spoke with Lattie Haw and informed her that pt a1c is 9.8. Per Lattie Haw pt contacted their office this morning to get an appointment. Informed Lattie Haw that they are wanting pt A1C below 8. She is aware and doesn't have any questions or concerns

## 2018-11-18 DIAGNOSIS — I5021 Acute systolic (congestive) heart failure: Secondary | ICD-10-CM | POA: Diagnosis not present

## 2018-11-18 DIAGNOSIS — I25119 Atherosclerotic heart disease of native coronary artery with unspecified angina pectoris: Secondary | ICD-10-CM | POA: Diagnosis not present

## 2018-11-18 DIAGNOSIS — I255 Ischemic cardiomyopathy: Secondary | ICD-10-CM | POA: Diagnosis not present

## 2018-11-18 DIAGNOSIS — Z951 Presence of aortocoronary bypass graft: Secondary | ICD-10-CM | POA: Diagnosis not present

## 2018-11-18 DIAGNOSIS — I214 Non-ST elevation (NSTEMI) myocardial infarction: Secondary | ICD-10-CM | POA: Diagnosis not present

## 2018-11-18 DIAGNOSIS — I480 Paroxysmal atrial fibrillation: Secondary | ICD-10-CM | POA: Diagnosis not present

## 2019-01-07 ENCOUNTER — Other Ambulatory Visit: Payer: Self-pay | Admitting: Internal Medicine

## 2019-01-07 DIAGNOSIS — E1140 Type 2 diabetes mellitus with diabetic neuropathy, unspecified: Secondary | ICD-10-CM

## 2019-01-07 DIAGNOSIS — I25810 Atherosclerosis of coronary artery bypass graft(s) without angina pectoris: Secondary | ICD-10-CM

## 2019-01-07 NOTE — Telephone Encounter (Signed)
1) Medication(s) Requested (by name): carvedilol (COREG) 12.5 MG tablet furosemide (LASIX) 20 MG tablet  lisinopril (PRINIVIL,ZESTRIL) 5 MG tablet  metFORMIN (GLUCOPHAGE) 1000 MG tablet  insulin aspart (NOVOLOG) 100 UNIT/ML injection  insulin glargine (LANTUS) 100 UNIT/ML injection     2) Pharmacy of Choice: [facility] on [address], [place]   3) Special Requests:   Approved medications will be sent to the pharmacy, we will reach out if there is an issue.  Requests made after 3pm may not be addressed until the following business day!  If a patient is unsure of the name of the medication(s) please note and ask patient to call back when they are able to provide all info, do not send to responsible party until all information is available!]

## 2019-01-08 MED ORDER — INSULIN ASPART 100 UNIT/ML ~~LOC~~ SOLN: SUBCUTANEOUS | 6 refills | Status: DC

## 2019-01-08 MED ORDER — METFORMIN HCL 1000 MG PO TABS: 1000.0000 mg | ORAL_TABLET | Freq: Two times a day (BID) | ORAL | 2 refills | Status: DC

## 2019-01-08 MED ORDER — LISINOPRIL 5 MG PO TABS: 2.5000 mg | ORAL_TABLET | Freq: Every day | ORAL | 2 refills | Status: DC

## 2019-01-08 MED ORDER — INSULIN GLARGINE 100 UNIT/ML ~~LOC~~ SOLN: SUBCUTANEOUS | 6 refills | Status: DC

## 2019-01-08 MED ORDER — FUROSEMIDE 20 MG PO TABS: ORAL_TABLET | ORAL | 0 refills | Status: DC

## 2019-01-09 ENCOUNTER — Telehealth: Payer: Self-pay | Admitting: Internal Medicine

## 2019-01-09 ENCOUNTER — Encounter: Payer: Self-pay | Admitting: Internal Medicine

## 2019-01-09 ENCOUNTER — Other Ambulatory Visit: Payer: Self-pay

## 2019-01-09 ENCOUNTER — Ambulatory Visit: Payer: Medicare Other | Attending: Internal Medicine | Admitting: Internal Medicine

## 2019-01-09 ENCOUNTER — Ambulatory Visit: Payer: Medicare Other | Admitting: Internal Medicine

## 2019-01-09 VITALS — BP 120/74 | HR 104 | Temp 97.8°F | Resp 16 | Wt 179.8 lb

## 2019-01-09 DIAGNOSIS — IMO0002 Reserved for concepts with insufficient information to code with codable children: Secondary | ICD-10-CM

## 2019-01-09 DIAGNOSIS — E663 Overweight: Secondary | ICD-10-CM | POA: Diagnosis not present

## 2019-01-09 DIAGNOSIS — E1165 Type 2 diabetes mellitus with hyperglycemia: Secondary | ICD-10-CM

## 2019-01-09 DIAGNOSIS — I2510 Atherosclerotic heart disease of native coronary artery without angina pectoris: Secondary | ICD-10-CM | POA: Diagnosis not present

## 2019-01-09 DIAGNOSIS — I10 Essential (primary) hypertension: Secondary | ICD-10-CM | POA: Diagnosis not present

## 2019-01-09 DIAGNOSIS — E118 Type 2 diabetes mellitus with unspecified complications: Secondary | ICD-10-CM | POA: Diagnosis not present

## 2019-01-09 LAB — POCT GLYCOSYLATED HEMOGLOBIN (HGB A1C): HbA1c, POC (controlled diabetic range): 9.3 % — AB (ref 0.0–7.0)

## 2019-01-09 LAB — GLUCOSE, POCT (MANUAL RESULT ENTRY): POC Glucose: 185 mg/dl — AB (ref 70–99)

## 2019-01-09 MED ORDER — ACCU-CHEK SOFTCLIX LANCETS MISC: 12 refills | Status: AC

## 2019-01-09 MED ORDER — ATORVASTATIN CALCIUM 40 MG PO TABS: 40.0000 mg | ORAL_TABLET | Freq: Every day | ORAL | 1 refills | Status: DC

## 2019-01-09 MED ORDER — LISINOPRIL 5 MG PO TABS: 2.5000 mg | ORAL_TABLET | Freq: Every day | ORAL | 2 refills | Status: DC

## 2019-01-09 MED ORDER — INSULIN ASPART 100 UNIT/ML ~~LOC~~ SOLN: SUBCUTANEOUS | 6 refills | Status: DC

## 2019-01-09 MED ORDER — METFORMIN HCL 1000 MG PO TABS: 1000.0000 mg | ORAL_TABLET | Freq: Two times a day (BID) | ORAL | 1 refills | Status: DC

## 2019-01-09 MED ORDER — ASPIRIN 325 MG PO TBEC: 325.0000 mg | DELAYED_RELEASE_TABLET | Freq: Every day | ORAL | 1 refills | Status: AC

## 2019-01-09 MED ORDER — FUROSEMIDE 20 MG PO TABS: ORAL_TABLET | ORAL | 0 refills | Status: DC

## 2019-01-09 MED ORDER — CARVEDILOL 12.5 MG PO TABS: 12.5000 mg | ORAL_TABLET | Freq: Two times a day (BID) | ORAL | 1 refills | Status: DC

## 2019-01-09 MED ORDER — GLUCOSE BLOOD VI STRP: ORAL_STRIP | 12 refills | Status: DC

## 2019-01-09 MED ORDER — INSULIN GLARGINE 100 UNIT/ML ~~LOC~~ SOLN: SUBCUTANEOUS | 6 refills | Status: DC

## 2019-01-09 MED ORDER — GLUCOSE BLOOD VI STRP: ORAL_STRIP | 12 refills | Status: AC

## 2019-01-09 MED ORDER — EMPAGLIFLOZIN 10 MG PO TABS: 10.0000 mg | ORAL_TABLET | Freq: Every day | ORAL | 1 refills | Status: DC

## 2019-01-09 MED ORDER — ACCU-CHEK SOFTCLIX LANCETS MISC: 12 refills | Status: DC

## 2019-01-09 NOTE — Progress Notes (Signed)
Pt states he has some discomfort in his right leg

## 2019-01-09 NOTE — Telephone Encounter (Signed)
Pharmacy called stating that the prescriptions for the accu check test strips/lancets need updated specified instructions in order to fill. Please follow up

## 2019-01-09 NOTE — Progress Notes (Signed)
Patient ID: George Griffin, adult    DOB: May 27, 1961  MRN: [id number]  CC: Hypertension and Diabetes   Subjective: George Griffin is a 58 y.o. adult who presents for chronic ds management Her concerns today include:  Hx of DM with neuropathy, lung CA s/p chem/XRT HL, HTN, CAD/ICM s/p CABG x 4 with EF 30-35%, PAF.  Last seen in January.  He brings his medicines with him today.  DM:  Checks BS every other day twice a day.  Usually  before BF and after a meal.  Has log with him.  BS before BF range 97-140, after meals 99-164.  One hypoglycemic episode.  Can tell when BS is low and knows how to address appropriately. -Meds:  Reports compliance with Lantus 60 u, Movolog 26-30 units with meals,  Metformin and Jardiance.  Out of Jardiance x 1 wk -Not getting in much exercise because he is afraid to go out side due to the COVID-19 pandemic -has moved to [place] because he was living in a house with several other people and felt that would put him at risk for COVID-19.  He drove 5-1/2 hours to come to this appointment today.  He thinks he may stay in [place] for several months or longer.  He is requesting that all of his medications be transferred to [facility] that is close to the neighborhood that he currently lives in in [place].    CAD/ICM/hypertension: Reports compliance with all of his medications.  No chest pains or shortness of breath.  No lower extremity edema. Patient Active Problem List   Diagnosis Date Noted  . Paroxysmal atrial fibrillation ([place]) 09/06/2018  . Microalbuminuria 09/05/2017  . S/P CABG x 4 03/23/2017  . Coronary artery disease involving native coronary artery of native heart with angina pectoris ([place]) 03/17/2017  . Ischemic cardiomyopathy 03/17/2017  . Non-STEMI (non-ST elevated myocardial infarction) ([place]) 03/16/2017  . Poor social situation 10/05/2015  . History of lung cancer 08/08/2011  . DM2 (diabetes mellitus, type 2) ([place]) 08/03/2011  . Hyperlipidemia with target low density  lipoprotein (LDL) cholesterol less than 70 mg/dL   . Diabetic neuropathy, type II diabetes mellitus ([place])   . Hepatic steatosis      Current Outpatient Medications on File Prior to Visit  Medication Sig Dispense Refill  . Blood Glucose Monitoring Suppl (ACCU-CHEK AVIVA) device Use as instructed 1 each 0  . gabapentin (NEURONTIN) 300 MG capsule Take 1 capsule (300 mg total) by mouth 3 (three) times daily. 270 capsule 3   No current facility-administered medications on file prior to visit.     Allergies  Allergen Reactions  . No Known Allergies     Social History   Socioeconomic History  . Marital status: Married    Spouse name: Not on file  . Number of children: Not on file  . Years of education: Not on file  . Highest education level: Not on file  Occupational History    Employer: UNEMPLOYED  Social Needs  . Financial resource strain: Not on file  . Food insecurity:    Worry: Not on file    Inability: Not on file  . Transportation needs:    Medical: Not on file    Non-medical: Not on file  Tobacco Use  . Smoking status: Former Smoker    Last attempt to quit: 05/26/2008    Years since quitting: 10.6  . Smokeless tobacco: Never Used  Substance and Sexual Activity  . Alcohol use: Yes  Comment: 1 can of beer occasionally  . Drug use: No  . Sexual activity: Not on file  Lifestyle  . Physical activity:    Days per week: Not on file    Minutes per session: Not on file  . Stress: Not on file  Relationships  . Social connections:    Talks on phone: Not on file    Gets together: Not on file    Attends religious service: Not on file    Active member of club or organization: Not on file    Attends meetings of clubs or organizations: Not on file    Relationship status: Not on file  . Intimate partner violence:    Fear of current or ex partner: Not on file    Emotionally abused: Not on file    Physically abused: Not on file    Forced sexual activity: Not on file   Other Topics Concern  . Not on file  Social History Narrative  . Not on file    Family History  Problem Relation Age of Onset  . CAD Neg Hx   . Hypertension Neg Hx   . Diabetes Neg Hx     Past Surgical History:  Procedure Laterality Date  . CORONARY ARTERY BYPASS GRAFT N/A 03/23/2017   Procedure: CORONARY ARTERY BYPASS GRAFTING (CABG) x , four using left internal mammary artery and right greater saphenous vein harvested endoscopically;  Surgeon: Ivin Poot, MD;  Location: [facility];  Service: Open Heart Surgery;  Laterality: N/A;  . ENDOVEIN HARVEST OF GREATER SAPHENOUS VEIN Right 03/23/2017   Procedure: ENDOVEIN HARVEST OF GREATER SAPHENOUS VEIN;  Surgeon: Ivin Poot, MD;  Location: [facility];  Service: Open Heart Surgery;  Laterality: Right;  . LEFT HEART CATH AND CORONARY ANGIOGRAPHY Left 03/16/2017   Procedure: Left Heart Cath and Coronary Angiography;  Surgeon: Isaias Cowman, MD;  Location: [facility];  Service: Cardiovascular;  Laterality: Left;  . none    . RIGHT HEART CATH N/A 03/21/2017   Procedure: Right Heart Cath;  Surgeon: Larey Dresser, MD;  Location: [facility];  Service: Cardiovascular;  Laterality: N/A;  . TEE WITHOUT CARDIOVERSION N/A 03/23/2017   Procedure: TRANSESOPHAGEAL ECHOCARDIOGRAM (TEE);  Surgeon: Prescott Gum, Collier Salina, MD;  Location: [facility];  Service: Open Heart Surgery;  Laterality: N/A;    ROS: Review of Systems Negative except as stated above  PHYSICAL EXAM: BP 120/74   Pulse (!) 104   Temp 97.8 F (36.6 C) (Oral)   Resp 16   Wt 179 lb 12.8 oz (81.6 kg)   SpO2 99%   BMI 29.02 kg/m   Wt Readings from Last 3 Encounters:  01/09/19 179 lb 12.8 oz (81.6 kg)  09/06/18 185 lb 3.2 oz (84 kg)  06/04/18 181 lb 12.8 oz (82.5 kg)    Physical Exam General appearance - alert, well appearing, and in no distress Mental status - normal mood, behavior, speech, dress, motor activity, and thought processes Neck - supple, no  significant adenopathy Chest - clear to auscultation, no wheezes, rales or rhonchi, symmetric air entry Heart - normal rate, regular rhythm, normal S1, S2, no murmurs, rubs, clicks or gallops.  Pulses when checked by my CMA was elevated, but on auscultation, heart rate sounds regular without tachycardia Extremities - peripheral pulses normal, no pedal edema, no clubbing or cyanosis  Results for orders placed or performed in visit on 01/09/19  POCT glucose (manual entry)  Result Value Ref Range  POC Glucose 185 (A) 70 - 99 mg/dl  POCT glycosylated hemoglobin (Hb A1C)  Result Value Ref Range   Hemoglobin A1C     HbA1c POC (<> result, manual entry)     HbA1c, POC (prediabetic range)     HbA1c, POC (controlled diabetic range) 9.3 (A) 0.0 - 7.0 %     CMP Latest Ref Rng & Units 04/04/2018 06/07/2017 03/30/2017  Glucose 65 - 99 mg/dL 343(H) 276(H) 135(H)  BUN 6 - 24 mg/dL 26(H) 19 10  Creatinine 0.76 - 1.27 mg/dL 0.97 0.97 0.72  Sodium 134 - 144 mmol/L 138 138 136  Potassium 3.5 - 5.2 mmol/L 4.8 4.3 4.0  Chloride 96 - 106 mmol/L 99 99 102  CO2 20 - 29 mmol/L 21 24 28   Calcium 8.7 - 10.2 mg/dL 9.6 10.2 8.7(L)  Total Protein 6.0 - 8.5 g/dL 8.0 7.5 -  Total Bilirubin 0.0 - 1.2 mg/dL 0.4 0.3 -  Alkaline Phos 39 - 117 IU/L 66 63 -  AST 0 - 40 IU/L 23 10 -  ALT 0 - 44 IU/L 65(H) 23 -   Lipid Panel     Component Value Date/Time   CHOL 192 04/04/2018 0910   TRIG 152 (H) 04/04/2018 0910   HDL 38 (L) 04/04/2018 0910   CHOLHDL 5.1 (H) 04/04/2018 0910   CHOLHDL 4.4 03/18/2017 0428   VLDL 37 03/18/2017 0428   LDLCALC 124 (H) 04/04/2018 0910    CBC    Component Value Date/Time   WBC 10.8 04/04/2018 0910   WBC 8.6 03/30/2017 0437   RBC 5.56 04/04/2018 0910   RBC 2.91 (L) 03/30/2017 0437   HGB 16.2 04/04/2018 0910   HGB 14.5 04/18/2016 1013   HCT 49.8 04/04/2018 0910   HCT 44.8 04/18/2016 1013   PLT 170 04/04/2018 0910   MCV 90 04/04/2018 0910   MCV 90.6 04/18/2016 1013   MCH 29.1  04/04/2018 0910   MCH 28.2 03/30/2017 0437   MCHC 32.5 04/04/2018 0910   MCHC 32.3 03/30/2017 0437   RDW 14.8 04/04/2018 0910   RDW 13.8 04/18/2016 1013   LYMPHSABS 1.8 04/18/2016 1013   MONOABS 0.5 04/18/2016 1013   EOSABS 1.3 (H) 04/18/2016 1013   BASOSABS 0.1 04/18/2016 1013    ASSESSMENT AND PLAN: 1. Diabetes mellitus type 2, uncontrolled, with complications ([place]) Reported blood sugars are at goal but A1c is not and has decreased only slightly from 4 months ago.  I would like to refer him to an endocrinologist but patient has now [place] out of state.  I recommend that he get established with a PCP in [place] ASAP and request referral to endocrinology. -Encouraged him to continue healthy eating habits Advised him that it is okay to get out and walk for exercise as long as he maintains social distancing - POCT glucose (manual entry) - POCT glycosylated hemoglobin (Hb A1C) - insulin aspart (NOVOLOG) 100 UNIT/ML injection; Inject 26 units into the skin 3 times daily with meals.  Dispense: 70 mL; Refill: 6 - insulin glargine (LANTUS) 100 UNIT/ML injection; 60 units subcut in a.m and 60 units in p.m.  Dispense: 30 mL; Refill: 6 - metFORMIN (GLUCOPHAGE) 1000 MG tablet; Take 1 tablet (1,000 mg total) by mouth 2 (two) times daily with a meal.  Dispense: 180 tablet; Refill: 1 - empagliflozin (JARDIANCE) 10 MG TABS tablet; Take 10 mg by mouth daily.  Dispense: 90 tablet; Refill: 1 - Accu-Chek Softclix Lancets lancets; Use as instructed  Dispense: 100 each; Refill: 12 -  glucose blood (ACCU-CHEK AVIVA PLUS) test strip; Use as instructed  Dispense: 100 each; Refill: 12  2. Coronary artery disease involving native coronary artery of native heart without angina pectoris Clinically stable - atorvastatin (LIPITOR) 40 MG tablet; Take 1 tablet (40 mg total) by mouth daily at 6 PM.  Dispense: 90 tablet; Refill: 1 - carvedilol (COREG) 12.5 MG tablet; Take 1 tablet (12.5 mg total) by mouth 2 (two) times  daily with a meal.  Dispense: 180 tablet; Refill: 1 - lisinopril (ZESTRIL) 5 MG tablet; Take 0.5 tablets (2.5 mg total) by mouth daily.  Dispense: 45 tablet; Refill: 2  3. Essential hypertension At goal - lisinopril (ZESTRIL) 5 MG tablet; Take 0.5 tablets (2.5 mg total) by mouth daily.  Dispense: 45 tablet; Refill: 2  4. Over weight See #1 above    Patient was given the opportunity to ask questions.  Patient verbalized understanding of the plan and was able to repeat key elements of the plan.  [employer] interpreter used during this encounter.  #[id number]  Orders Placed This Encounter  Procedures  . POCT glucose (manual entry)  . POCT glycosylated hemoglobin (Hb A1C)     Requested Prescriptions   Signed Prescriptions Disp Refills  . furosemide (LASIX) 20 MG tablet 90 tablet 0    Sig: 1 tab PO daily as needed for swelling in legs  . atorvastatin (LIPITOR) 40 MG tablet 90 tablet 1    Sig: Take 1 tablet (40 mg total) by mouth daily at 6 PM.  . carvedilol (COREG) 12.5 MG tablet 180 tablet 1    Sig: Take 1 tablet (12.5 mg total) by mouth 2 (two) times daily with a meal.  . insulin aspart (NOVOLOG) 100 UNIT/ML injection 70 mL 6    Sig: Inject 26 units into the skin 3 times daily with meals.  . insulin glargine (LANTUS) 100 UNIT/ML injection 30 mL 6    Sig: 60 units subcut in a.m and 60 units in p.m.  Marland Kitchen metFORMIN (GLUCOPHAGE) 1000 MG tablet 180 tablet 1    Sig: Take 1 tablet (1,000 mg total) by mouth 2 (two) times daily with a meal.  . empagliflozin (JARDIANCE) 10 MG TABS tablet 90 tablet 1    Sig: Take 10 mg by mouth daily.  Marland Kitchen lisinopril (ZESTRIL) 5 MG tablet 45 tablet 2    Sig: Take 0.5 tablets (2.5 mg total) by mouth daily.  Marland Kitchen aspirin 325 MG EC tablet 100 tablet 1    Sig: Take 1 tablet (325 mg total) by mouth daily.  . Accu-Chek Softclix Lancets lancets 100 each 12    Sig: Use as instructed  . glucose blood (ACCU-CHEK AVIVA PLUS) test strip 100 each 12    Sig: Use as instructed     No follow-ups on file.  Karle Plumber, MD, FACP

## 2019-01-09 NOTE — Patient Instructions (Signed)
You should establish care with a primary doctor in [place]. Once you have established with a primary care physician, please have them refer you to see an endocrinologist regarding your diabetes.

## 2019-04-07 DIAGNOSIS — I255 Ischemic cardiomyopathy: Secondary | ICD-10-CM | POA: Diagnosis not present

## 2019-04-07 DIAGNOSIS — E785 Hyperlipidemia, unspecified: Secondary | ICD-10-CM | POA: Diagnosis not present

## 2019-04-07 DIAGNOSIS — I214 Non-ST elevation (NSTEMI) myocardial infarction: Secondary | ICD-10-CM | POA: Diagnosis not present

## 2019-04-07 DIAGNOSIS — Z951 Presence of aortocoronary bypass graft: Secondary | ICD-10-CM | POA: Diagnosis not present

## 2019-04-07 DIAGNOSIS — I2510 Atherosclerotic heart disease of native coronary artery without angina pectoris: Secondary | ICD-10-CM | POA: Diagnosis not present

## 2019-05-09 DIAGNOSIS — Z23 Encounter for immunization: Secondary | ICD-10-CM | POA: Diagnosis not present

## 2019-05-16 ENCOUNTER — Telehealth: Payer: Self-pay | Admitting: Internal Medicine

## 2019-05-16 NOTE — Telephone Encounter (Signed)
-----   Message from Jackelyn Knife, [place] sent at 05/02/2019  9:44 AM EDT ----- Please contact pt and schedule an in person visit (morning)

## 2019-05-16 NOTE — Telephone Encounter (Signed)
LVM to call back and schedule appt.

## 2019-06-04 ENCOUNTER — Encounter: Payer: Self-pay | Admitting: Family Medicine

## 2019-06-04 ENCOUNTER — Other Ambulatory Visit: Payer: Self-pay

## 2019-06-04 ENCOUNTER — Ambulatory Visit: Payer: Medicare Other | Attending: Family Medicine | Admitting: Family Medicine

## 2019-06-04 VITALS — BP 123/67 | HR 104 | Temp 98.0°F | Ht 66.0 in | Wt 183.0 lb

## 2019-06-04 DIAGNOSIS — I2510 Atherosclerotic heart disease of native coronary artery without angina pectoris: Secondary | ICD-10-CM | POA: Insufficient documentation

## 2019-06-04 DIAGNOSIS — E1159 Type 2 diabetes mellitus with other circulatory complications: Secondary | ICD-10-CM | POA: Insufficient documentation

## 2019-06-04 DIAGNOSIS — Z794 Long term (current) use of insulin: Secondary | ICD-10-CM | POA: Diagnosis not present

## 2019-06-04 DIAGNOSIS — Z79899 Other long term (current) drug therapy: Secondary | ICD-10-CM | POA: Diagnosis not present

## 2019-06-04 DIAGNOSIS — E1140 Type 2 diabetes mellitus with diabetic neuropathy, unspecified: Secondary | ICD-10-CM | POA: Insufficient documentation

## 2019-06-04 DIAGNOSIS — E785 Hyperlipidemia, unspecified: Secondary | ICD-10-CM | POA: Insufficient documentation

## 2019-06-04 DIAGNOSIS — I252 Old myocardial infarction: Secondary | ICD-10-CM | POA: Insufficient documentation

## 2019-06-04 DIAGNOSIS — Z951 Presence of aortocoronary bypass graft: Secondary | ICD-10-CM | POA: Diagnosis not present

## 2019-06-04 DIAGNOSIS — I25810 Atherosclerosis of coronary artery bypass graft(s) without angina pectoris: Secondary | ICD-10-CM

## 2019-06-04 DIAGNOSIS — I10 Essential (primary) hypertension: Secondary | ICD-10-CM | POA: Diagnosis not present

## 2019-06-04 LAB — POCT GLYCOSYLATED HEMOGLOBIN (HGB A1C): HbA1c, POC (controlled diabetic range): 9.5 % — AB (ref 0.0–7.0)

## 2019-06-04 LAB — GLUCOSE, POCT (MANUAL RESULT ENTRY): POC Glucose: 289 mg/dl — AB (ref 70–99)

## 2019-06-04 MED ORDER — JARDIANCE 10 MG PO TABS: 10.0000 mg | ORAL_TABLET | Freq: Every day | ORAL | 1 refills | Status: DC

## 2019-06-04 MED ORDER — CARVEDILOL 12.5 MG PO TABS: 12.5000 mg | ORAL_TABLET | Freq: Two times a day (BID) | ORAL | 1 refills | Status: AC

## 2019-06-04 MED ORDER — ATORVASTATIN CALCIUM 40 MG PO TABS: 40.0000 mg | ORAL_TABLET | Freq: Every day | ORAL | 1 refills | Status: DC

## 2019-06-04 MED ORDER — FUROSEMIDE 20 MG PO TABS: ORAL_TABLET | ORAL | 0 refills | Status: DC

## 2019-06-04 MED ORDER — INSULIN GLARGINE 100 UNIT/ML ~~LOC~~ SOLN: SUBCUTANEOUS | 6 refills | Status: DC

## 2019-06-04 MED ORDER — GABAPENTIN 300 MG PO CAPS: 300.0000 mg | ORAL_CAPSULE | Freq: Three times a day (TID) | ORAL | 3 refills | Status: AC

## 2019-06-04 MED ORDER — LISINOPRIL 5 MG PO TABS: 2.5000 mg | ORAL_TABLET | Freq: Every day | ORAL | 2 refills | Status: DC

## 2019-06-04 MED ORDER — METFORMIN HCL 1000 MG PO TABS: 1000.0000 mg | ORAL_TABLET | Freq: Two times a day (BID) | ORAL | 1 refills | Status: AC

## 2019-06-04 NOTE — Progress Notes (Signed)
Subjective:  Patient ID: George Griffin, adult    DOB: 1961-02-21  Age: 59 y.o. MRN: [id number]  CC: Diabetes   HPI George Griffin is a 58 year old male patient of Wynetta Emery with a history of Type 2 Diabetes Mellitus (A1c 9.5), Hypertension, CAD s/p CABGx4, stage III Lung Cancer s/p radiation and chemotherapy.He presents today for medication refills. Last visit to [facility] clinic Cardiology was in 03/2019. He denies chest pains, palpitations, dyspnea.  With regards to his Diabetes he has been on 58 units of Lantus rather than 60 units bid and his A1c is 9.5. His Neuropathy is controlled on Gabapentin and he denies visual concerns. Tolerating his antihypertensive and statin with no adverse effects. He has no additional concerns today.  Past Medical History:  Diagnosis Date  . Acquired hypothyroidism 10/05/2015  . CAD (coronary artery disease), native coronary artery 03/17/2017  . Constipation   . Diabetes mellitus    metfomin  . ED (erectile dysfunction)   . Hepatic steatosis 07/26/11   severe ct chest  . Hyperlipidemia   . Hypertension   . Ischemic cardiomyopathy 03/17/2017  . Lung cancer ([place])    lung ca dx5/02/2011  . Neuropathy of finger    mild s/p chemotherapy  . Non-STEMI (non-ST elevated myocardial infarction) ([place]) 03/16/2017  . Pneumonia    hx    Past Surgical History:  Procedure Laterality Date  . CORONARY ARTERY BYPASS GRAFT N/A 03/23/2017   Procedure: CORONARY ARTERY BYPASS GRAFTING (CABG) x , four using left internal mammary artery and right greater saphenous vein harvested endoscopically;  Surgeon: Ivin Poot, MD;  Location: [place];  Service: Open Heart Surgery;  Laterality: N/A;  . ENDOVEIN HARVEST OF GREATER SAPHENOUS VEIN Right 03/23/2017   Procedure: ENDOVEIN HARVEST OF GREATER SAPHENOUS VEIN;  Surgeon: Ivin Poot, MD;  Location: [place];  Service: Open Heart Surgery;  Laterality: Right;  . LEFT HEART CATH AND CORONARY ANGIOGRAPHY Left 03/16/2017   Procedure:  Left Heart Cath and Coronary Angiography;  Surgeon: Isaias Cowman, MD;  Location: [place] [facility];  Service: Cardiovascular;  Laterality: Left;  . none    . RIGHT HEART CATH N/A 03/21/2017   Procedure: Right Heart Cath;  Surgeon: Larey Dresser, MD;  Location: [place] [facility];  Service: Cardiovascular;  Laterality: N/A;  . TEE WITHOUT CARDIOVERSION N/A 03/23/2017   Procedure: TRANSESOPHAGEAL ECHOCARDIOGRAM (TEE);  Surgeon: Prescott Gum, Collier Salina, MD;  Location: [place];  Service: Open Heart Surgery;  Laterality: N/A;    Family History  Problem Relation Age of Onset  . CAD Neg Hx   . Hypertension Neg Hx   . Diabetes Neg Hx     Allergies  Allergen Reactions  . No Known Allergies     Outpatient Medications Prior to Visit  Medication Sig Dispense Refill  . Accu-Chek Softclix Lancets lancets Use as instructed to test blood sugar three times daily. 100 each 12  . aspirin 325 MG EC tablet Take 1 tablet (325 mg total) by mouth daily. 100 tablet 1  . glucose blood (ACCU-CHEK AVIVA PLUS) test strip Use as instructed to test blood sugar three times daily. 100 each 12  . insulin aspart (NOVOLOG) 100 UNIT/ML injection Inject 26 units into the skin 3 times daily with meals. 70 mL 6  . atorvastatin (LIPITOR) 40 MG tablet Take 1 tablet (40 mg total) by mouth daily at 6 PM. 90 tablet 1  . carvedilol (COREG) 12.5 MG tablet Take 1  tablet (12.5 mg total) by mouth 2 (two) times daily with a meal. 180 tablet 1  . empagliflozin (JARDIANCE) 10 MG TABS tablet Take 10 mg by mouth daily. 90 tablet 1  . furosemide (LASIX) 20 MG tablet 1 tab PO daily as needed for swelling in legs 90 tablet 0  . gabapentin (NEURONTIN) 300 MG capsule Take 1 capsule (300 mg total) by mouth 3 (three) times daily. 270 capsule 3  . insulin glargine (LANTUS) 100 UNIT/ML injection 60 units subcut in a.m and 60 units in p.m. 30 mL 6  . lisinopril (ZESTRIL) 5 MG tablet Take 0.5 tablets (2.5 mg total) by mouth daily. 45 tablet 2  .  metFORMIN (GLUCOPHAGE) 1000 MG tablet Take 1 tablet (1,000 mg total) by mouth 2 (two) times daily with a meal. 180 tablet 1   No facility-administered medications prior to visit.      ROS Review of Systems  Constitutional: Negative for activity change, appetite change and fatigue.  HENT: Negative for congestion, sinus pressure and sore throat.   Eyes: Negative for visual disturbance.  Respiratory: Negative for cough, chest tightness, shortness of breath and wheezing.   Cardiovascular: Negative for chest pain and palpitations.  Gastrointestinal: Negative for abdominal distention, abdominal pain and constipation.  Endocrine: Negative for polydipsia.  Genitourinary: Negative for dysuria and frequency.  Musculoskeletal: Negative for arthralgias and back pain.  Skin: Negative for rash.  Neurological: Negative for tremors, light-headedness and numbness.  Hematological: Does not bruise/bleed easily.  Psychiatric/Behavioral: Negative for agitation and behavioral problems.    Objective:  BP 123/67   Pulse (!) 104   Temp 98 F (36.7 C) (Oral)   Ht _0  (1.676 m)   Wt 183 lb (83 kg)   SpO2 97%   BMI 29.54 kg/m   BP/Weight 06/04/2019 01/09/2019 11/16/252  Systolic BP 270 623 762  Diastolic BP 67 74 70  Wt. (Lbs) 183 179.8 185.2  BMI 29.54 29.02 29.89      Physical Exam Constitutional:      Appearance: She is well-developed.  Neck:     Vascular: No JVD.  Cardiovascular:     Rate and Rhythm: Tachycardia present.     Heart sounds: Normal heart sounds. No murmur.  Pulmonary:     Effort: Pulmonary effort is normal.     Breath sounds: Normal breath sounds. No wheezing or rales.  Chest:     Chest wall: No tenderness.  Abdominal:     General: Bowel sounds are normal. There is no distension.     Palpations: Abdomen is soft. There is no mass.     Tenderness: There is no abdominal tenderness.  Musculoskeletal: Normal range of motion.     Right lower leg: No edema.     Left lower  leg: No edema.  Neurological:     Mental Status: She is alert and oriented to person, place, and time.  Psychiatric:        Mood and Affect: Mood normal.     CMP Latest Ref Rng & Units 04/04/2018 06/07/2017 03/30/2017  Glucose 65 - 99 mg/dL 343(H) 276(H) 135(H)  BUN 6 - 24 mg/dL 26(H) 19 10  Creatinine 0.76 - 1.27 mg/dL 0.97 0.97 0.72  Sodium 134 - 144 mmol/L 138 138 136  Potassium 3.5 - 5.2 mmol/L 4.8 4.3 4.0  Chloride 96 - 106 mmol/L 99 99 102  CO2 20 - 29 mmol/L _1 Calcium 8.7 - 10.2 mg/dL 9.6 10.2 8.7(L)  Total Protein 6.0 -  8.5 g/dL 8.0 7.5 -  Total Bilirubin 0.0 - 1.2 mg/dL 0.4 0.3 -  Alkaline Phos 39 - 117 IU/L 66 63 -  AST 0 - 40 IU/L 23 10 -  ALT 0 - 44 IU/L 65(H) 23 -    Lipid Panel     Component Value Date/Time   CHOL 192 04/04/2018 0910   TRIG 152 (H) 04/04/2018 0910   HDL 38 (L) 04/04/2018 0910   CHOLHDL 5.1 (H) 04/04/2018 0910   CHOLHDL 4.4 03/18/2017 0428   VLDL 37 03/18/2017 0428   LDLCALC 124 (H) 04/04/2018 0910    CBC    Component Value Date/Time   WBC 10.8 04/04/2018 0910   WBC 8.6 03/30/2017 0437   RBC 5.56 04/04/2018 0910   RBC 2.91 (L) 03/30/2017 0437   HGB 16.2 04/04/2018 0910   HGB 14.5 04/18/2016 1013   HCT 49.8 04/04/2018 0910   HCT 44.8 04/18/2016 1013   PLT 170 04/04/2018 0910   MCV 90 04/04/2018 0910   MCV 90.6 04/18/2016 1013   MCH 29.1 04/04/2018 0910   MCH 28.2 03/30/2017 0437   MCHC 32.5 04/04/2018 0910   MCHC 32.3 03/30/2017 0437   RDW 14.8 04/04/2018 0910   RDW 13.8 04/18/2016 1013   LYMPHSABS 1.8 04/18/2016 1013   MONOABS 0.5 04/18/2016 1013   EOSABS 1.3 (H) 04/18/2016 1013   BASOSABS 0.1 04/18/2016 1013    Lab Results  Component Value Date   HGBA1C 9.5 (A) 06/04/2019    Assessment & Plan:   1. Type 2 diabetes mellitus with other circulatory complication, with long-term current use of insulin (HCC) Uncontrolled with A1c of 9.5 Increase Lantus from 58 to 63 units bid Counseled on adherence to diabetic diet  and lifestyle modifications - POCT glucose (manual entry) - POCT glycosylated hemoglobin (Hb A1C) - metFORMIN (GLUCOPHAGE) 1000 MG tablet; Take 1 tablet (1,000 mg total) by mouth 2 (two) times daily with a meal.  Dispense: 180 tablet; Refill: 1 - insulin glargine (LANTUS) 100 UNIT/ML injection; 63 units subcut in a.m and 63 units in p.m.  Dispense: 30 mL; Refill: 6 - CMP14+EGFR; Future - Microalbumin / creatinine urine ratio; Future - empagliflozin (JARDIANCE) 10 MG TABS tablet; Take 10 mg by mouth daily.  Dispense: 90 tablet; Refill: 1  2. Coronary artery disease involving native coronary artery of native heart without angina pectoris Stable Risk factor modifications - lisinopril (ZESTRIL) 5 MG tablet; Take 0.5 tablets (2.5 mg total) by mouth daily.  Dispense: 45 tablet; Refill: 2 - atorvastatin (LIPITOR) 40 MG tablet; Take 1 tablet (40 mg total) by mouth daily at 6 PM.  Dispense: 90 tablet; Refill: 1 - carvedilol (COREG) 12.5 MG tablet; Take 1 tablet (12.5 mg total) by mouth 2 (two) times daily with a meal.  Dispense: 180 tablet; Refill: 1  3. Essential hypertension Controlled Counseled on blood pressure goal of less than 130/80, low-sodium, DASH diet, medication compliance, 150 minutes of moderate intensity exercise per week. Discussed medication compliance, adverse effects. - lisinopril (ZESTRIL) 5 MG tablet; Take 0.5 tablets (2.5 mg total) by mouth daily.  Dispense: 45 tablet; Refill: 2  4. Type 2 diabetes mellitus with diabetic neuropathy, with long-term current use of insulin (HCC) Neuropathy is controlled - gabapentin (NEURONTIN) 300 MG capsule; Take 1 capsule (300 mg total) by mouth 3 (three) times daily.  Dispense: 270 capsule; Refill: 3    Meds ordered this encounter  Medications  . metFORMIN (GLUCOPHAGE) 1000 MG tablet    Sig: Take 1  tablet (1,000 mg total) by mouth 2 (two) times daily with a meal.    Dispense:  180 tablet    Refill:  1  . lisinopril (ZESTRIL) 5 MG  tablet    Sig: Take 0.5 tablets (2.5 mg total) by mouth daily.    Dispense:  45 tablet    Refill:  2  . insulin glargine (LANTUS) 100 UNIT/ML injection    Sig: 63 units subcut in a.m and 63 units in p.m.    Dispense:  30 mL    Refill:  6    Dose increase  . atorvastatin (LIPITOR) 40 MG tablet    Sig: Take 1 tablet (40 mg total) by mouth daily at 6 PM.    Dispense:  90 tablet    Refill:  1    Dose decrease  . carvedilol (COREG) 12.5 MG tablet    Sig: Take 1 tablet (12.5 mg total) by mouth 2 (two) times daily with a meal.    Dispense:  180 tablet    Refill:  1  . empagliflozin (JARDIANCE) 10 MG TABS tablet    Sig: Take 10 mg by mouth daily.    Dispense:  90 tablet    Refill:  1  . furosemide (LASIX) 20 MG tablet    Sig: 1 tab PO daily as needed for swelling in legs    Dispense:  90 tablet    Refill:  0  . gabapentin (NEURONTIN) 300 MG capsule    Sig: Take 1 capsule (300 mg total) by mouth 3 (three) times daily.    Dispense:  270 capsule    Refill:  3    Dose increase    Follow-up: Return in about 3 months (around 09/04/2019) for Medical conditions with PCP - Dr Wynetta Emery.       Charlott Rakes, MD, FAAFP. [facility] and [facility] [place], [place]   06/04/2019, 3:43 PM

## 2019-06-05 ENCOUNTER — Other Ambulatory Visit: Payer: Self-pay

## 2019-06-05 ENCOUNTER — Ambulatory Visit: Payer: Medicare Other | Attending: Internal Medicine

## 2019-06-05 DIAGNOSIS — Z794 Long term (current) use of insulin: Secondary | ICD-10-CM | POA: Diagnosis not present

## 2019-06-05 DIAGNOSIS — E1159 Type 2 diabetes mellitus with other circulatory complications: Secondary | ICD-10-CM | POA: Diagnosis not present

## 2019-06-06 LAB — CMP14+EGFR
ALT: 50 IU/L — ABNORMAL HIGH (ref 0–44)
AST: 25 IU/L (ref 0–40)
Albumin/Globulin Ratio: 1.8 (ref 1.2–2.2)
Albumin: 4.8 g/dL (ref 3.8–4.9)
Alkaline Phosphatase: 46 IU/L (ref 39–117)
BUN/Creatinine Ratio: 22 — ABNORMAL HIGH (ref 9–20)
BUN: 21 mg/dL (ref 6–24)
Bilirubin Total: 0.3 mg/dL (ref 0.0–1.2)
CO2: 23 mmol/L (ref 20–29)
Calcium: 9.3 mg/dL (ref 8.7–10.2)
Chloride: 101 mmol/L (ref 96–106)
Creatinine, Ser: 0.95 mg/dL (ref 0.76–1.27)
GFR calc Af Amer: 102 mL/min/{1.73_m2} (ref >59)
GFR calc non Af Amer: 88 mL/min/{1.73_m2} (ref >59)
Globulin, Total: 2.7 g/dL (ref 1.5–4.5)
Glucose: 137 mg/dL — ABNORMAL HIGH (ref 65–99)
Potassium: 3.7 mmol/L (ref 3.5–5.2)
Sodium: 142 mmol/L (ref 134–144)
Total Protein: 7.5 g/dL (ref 6.0–8.5)

## 2019-06-06 LAB — MICROALBUMIN / CREATININE URINE RATIO
Creatinine, Urine: 178.7 mg/dL
Microalb/Creat Ratio: 282 mg/g creat — ABNORMAL HIGH (ref 0–29)
Microalbumin, Urine: 503.2 ug/mL

## 2019-10-07 DIAGNOSIS — I214 Non-ST elevation (NSTEMI) myocardial infarction: Secondary | ICD-10-CM | POA: Diagnosis not present

## 2019-10-07 DIAGNOSIS — I25119 Atherosclerotic heart disease of native coronary artery with unspecified angina pectoris: Secondary | ICD-10-CM | POA: Diagnosis not present

## 2019-10-07 DIAGNOSIS — R000 Tachycardia, unspecified: Secondary | ICD-10-CM | POA: Diagnosis not present

## 2019-10-07 DIAGNOSIS — R0600 Dyspnea, unspecified: Secondary | ICD-10-CM | POA: Diagnosis not present

## 2019-10-07 DIAGNOSIS — Z951 Presence of aortocoronary bypass graft: Secondary | ICD-10-CM | POA: Diagnosis not present

## 2019-10-07 DIAGNOSIS — E785 Hyperlipidemia, unspecified: Secondary | ICD-10-CM | POA: Diagnosis not present

## 2019-10-22 DIAGNOSIS — I25119 Atherosclerotic heart disease of native coronary artery with unspecified angina pectoris: Secondary | ICD-10-CM | POA: Diagnosis not present

## 2019-10-22 DIAGNOSIS — Z951 Presence of aortocoronary bypass graft: Secondary | ICD-10-CM | POA: Diagnosis not present

## 2019-10-22 DIAGNOSIS — I214 Non-ST elevation (NSTEMI) myocardial infarction: Secondary | ICD-10-CM | POA: Diagnosis not present

## 2019-10-27 DIAGNOSIS — E785 Hyperlipidemia, unspecified: Secondary | ICD-10-CM | POA: Diagnosis not present

## 2019-10-27 DIAGNOSIS — Z951 Presence of aortocoronary bypass graft: Secondary | ICD-10-CM | POA: Diagnosis not present

## 2019-10-27 DIAGNOSIS — I214 Non-ST elevation (NSTEMI) myocardial infarction: Secondary | ICD-10-CM | POA: Diagnosis not present

## 2019-10-27 DIAGNOSIS — R0600 Dyspnea, unspecified: Secondary | ICD-10-CM | POA: Diagnosis not present

## 2019-10-27 DIAGNOSIS — I255 Ischemic cardiomyopathy: Secondary | ICD-10-CM | POA: Diagnosis not present

## 2019-10-27 DIAGNOSIS — I2510 Atherosclerotic heart disease of native coronary artery without angina pectoris: Secondary | ICD-10-CM | POA: Diagnosis not present

## 2019-11-06 DIAGNOSIS — Z23 Encounter for immunization: Secondary | ICD-10-CM | POA: Diagnosis not present

## 2020-01-20 ENCOUNTER — Encounter: Payer: Self-pay | Admitting: Internal Medicine

## 2020-01-20 ENCOUNTER — Other Ambulatory Visit: Payer: Self-pay

## 2020-01-20 ENCOUNTER — Ambulatory Visit (HOSPITAL_COMMUNITY)
Admission: RE | Admit: 2020-01-20 | Discharge: 2020-01-20 | Disposition: A | Discharge status: Home / Self Care | Payer: Medicare Other | Source: Ambulatory Visit | Attending: Internal Medicine | Admitting: Internal Medicine

## 2020-01-20 ENCOUNTER — Ambulatory Visit (HOSPITAL_BASED_OUTPATIENT_CLINIC_OR_DEPARTMENT_OTHER): Payer: Medicare Other | Admitting: Internal Medicine

## 2020-01-20 VITALS — BP 126/73 | HR 96 | Temp 97.7°F | Resp 16 | Wt 192.4 lb

## 2020-01-20 DIAGNOSIS — Z1211 Encounter for screening for malignant neoplasm of colon: Secondary | ICD-10-CM

## 2020-01-20 DIAGNOSIS — Z609 Problem related to social environment, unspecified: Secondary | ICD-10-CM | POA: Insufficient documentation

## 2020-01-20 DIAGNOSIS — R0600 Dyspnea, unspecified: Secondary | ICD-10-CM | POA: Diagnosis not present

## 2020-01-20 DIAGNOSIS — I255 Ischemic cardiomyopathy: Secondary | ICD-10-CM | POA: Insufficient documentation

## 2020-01-20 DIAGNOSIS — I480 Paroxysmal atrial fibrillation: Secondary | ICD-10-CM | POA: Diagnosis not present

## 2020-01-20 DIAGNOSIS — Z87891 Personal history of nicotine dependence: Secondary | ICD-10-CM | POA: Insufficient documentation

## 2020-01-20 DIAGNOSIS — Z7982 Long term (current) use of aspirin: Secondary | ICD-10-CM | POA: Insufficient documentation

## 2020-01-20 DIAGNOSIS — G4709 Other insomnia: Secondary | ICD-10-CM | POA: Diagnosis not present

## 2020-01-20 DIAGNOSIS — I252 Old myocardial infarction: Secondary | ICD-10-CM | POA: Insufficient documentation

## 2020-01-20 DIAGNOSIS — Z125 Encounter for screening for malignant neoplasm of prostate: Secondary | ICD-10-CM | POA: Diagnosis not present

## 2020-01-20 DIAGNOSIS — Z85118 Personal history of other malignant neoplasm of bronchus and lung: Secondary | ICD-10-CM

## 2020-01-20 DIAGNOSIS — I10 Essential (primary) hypertension: Secondary | ICD-10-CM | POA: Diagnosis not present

## 2020-01-20 DIAGNOSIS — Z8249 Family history of ischemic heart disease and other diseases of the circulatory system: Secondary | ICD-10-CM | POA: Insufficient documentation

## 2020-01-20 DIAGNOSIS — R0609 Other forms of dyspnea: Secondary | ICD-10-CM

## 2020-01-20 DIAGNOSIS — C3401 Malignant neoplasm of right main bronchus: Secondary | ICD-10-CM | POA: Insufficient documentation

## 2020-01-20 DIAGNOSIS — Z794 Long term (current) use of insulin: Secondary | ICD-10-CM

## 2020-01-20 DIAGNOSIS — E1159 Type 2 diabetes mellitus with other circulatory complications: Secondary | ICD-10-CM

## 2020-01-20 DIAGNOSIS — E785 Hyperlipidemia, unspecified: Secondary | ICD-10-CM | POA: Insufficient documentation

## 2020-01-20 DIAGNOSIS — R0602 Shortness of breath: Secondary | ICD-10-CM | POA: Diagnosis not present

## 2020-01-20 DIAGNOSIS — Z560 Unemployment, unspecified: Secondary | ICD-10-CM | POA: Insufficient documentation

## 2020-01-20 DIAGNOSIS — Z951 Presence of aortocoronary bypass graft: Secondary | ICD-10-CM | POA: Insufficient documentation

## 2020-01-20 DIAGNOSIS — Z833 Family history of diabetes mellitus: Secondary | ICD-10-CM | POA: Insufficient documentation

## 2020-01-20 DIAGNOSIS — I25118 Atherosclerotic heart disease of native coronary artery with other forms of angina pectoris: Secondary | ICD-10-CM | POA: Diagnosis not present

## 2020-01-20 DIAGNOSIS — E1140 Type 2 diabetes mellitus with diabetic neuropathy, unspecified: Secondary | ICD-10-CM | POA: Insufficient documentation

## 2020-01-20 DIAGNOSIS — K760 Fatty (change of) liver, not elsewhere classified: Secondary | ICD-10-CM | POA: Insufficient documentation

## 2020-01-20 DIAGNOSIS — Z79899 Other long term (current) drug therapy: Secondary | ICD-10-CM | POA: Insufficient documentation

## 2020-01-20 LAB — GLUCOSE, POCT (MANUAL RESULT ENTRY): POC Glucose: 220 mg/dl — AB (ref 70–99)

## 2020-01-20 LAB — POCT GLYCOSYLATED HEMOGLOBIN (HGB A1C): HbA1c, POC (controlled diabetic range): 9.1 % — AB (ref 0.0–7.0)

## 2020-01-20 MED ORDER — ATORVASTATIN CALCIUM 80 MG PO TABS: 80.0000 mg | ORAL_TABLET | Freq: Every day | ORAL | 2 refills | Status: DC

## 2020-01-20 MED ORDER — INSULIN ASPART 100 UNIT/ML ~~LOC~~ SOLN: SUBCUTANEOUS | 6 refills | Status: DC

## 2020-01-20 MED ORDER — INSULIN GLARGINE 100 UNIT/ML ~~LOC~~ SOLN: SUBCUTANEOUS | 6 refills | Status: DC

## 2020-01-20 MED ORDER — ATORVASTATIN CALCIUM 80 MG PO TABS: 80.0000 mg | ORAL_TABLET | Freq: Every day | ORAL | 2 refills | Status: AC

## 2020-01-20 MED ORDER — EMPAGLIFLOZIN 10 MG PO TABS: 10.0000 mg | ORAL_TABLET | Freq: Every day | ORAL | 1 refills | Status: AC

## 2020-01-20 MED ORDER — FUROSEMIDE 20 MG PO TABS: ORAL_TABLET | ORAL | 1 refills | Status: AC

## 2020-01-20 NOTE — Progress Notes (Signed)
Patient ID: George Griffin, male    DOB: [birth year]  MRN: [id number]  CC: Diabetes and Hypertension   Subjective: Ramzy Elamin is a 59 y.o. male who presents for chronic ds management His concerns today include:  Hx of DM with neuropathy, lung CA s/p chem/XRT HL, HTN, CAD/ICM s/p CABG x 4 with EF 45-50%, PAF.    HYPERTENSION/CAD/PAF Currently taking: see medication list Med Adherence: [x]  Yes -Lipitor increased to 80 mg daily. Reports legs get weak when he takes Lisinopril.  He stopped taking 1 mth ago Medication side effects: [x]  Yes -see above   []  No Adherence with salt restriction: [x]  Yes    []  No Home Monitoring BP?: []  Yes    [x]  No Monitoring Frequency: []  Yes    []  No Home BP results range: []  Yes    []  No SOB? [x]  Yes when lifting heavy objects and bending over    []  No Chest Pain?: [x]  Yes - heavy activities like lifting heavy objects and bending over.  Had a [place] study 09/2019.  This revealed LVEF of 48% with no regional wall motion abnormalities with mild inferior wall ischemia.  2D echo revealed mildly reduced left ventricular function of 45 to 50% with trivial valvular insufficiencies.  Last saw his cardiologist in March.  Lipitor increased to 80 mg.  Patient encouraged to exercise as tolerated and lose weight.  He has a follow-up appointment again in June Leg swelling?: [x]  Yes -sometimes.  Out of Furosemide    []  No Headaches?: []  Yes    [x]  No Dizziness? []  Yes    [x]  No Comments: no palpitations.  Request handicap sticker form be completed  DIABETES TYPE 2 Last A1C:   Results for orders placed or performed in visit on 01/20/20  POCT glucose (manual entry)  Result Value Ref Range   POC Glucose 220 (A) 70 - 99 mg/dl  POCT glycosylated hemoglobin (Hb A1C)  Result Value Ref Range   Hemoglobin A1C     HbA1c POC (<> result, manual entry)     HbA1c, POC (prediabetic range)     HbA1c, POC (controlled diabetic range) 9.1 (A) 0.0 - 7.0 %    Med Adherence:  [x]  Yes  but does not have Jardiance with him.  He has been out of the medication.  Compliant with Lantus 62 BID, Novolog, 32 units 4 x a day with meals and Metformin.  Metformin bottle that he has with him was filled 06/10/2019 with a 3 mth supply which means he should have been out of it in January 2021 if he was taking consistently     Medication side effects:  []  Yes    [x]  No Home Monitoring?  [x]  Yes  BID before BF and at bedtime.  Does not have log book with him. Home glucose results range:  99-160 before BF; bedtime 150-160 Diet Adherence: [x]  Yes    []  No Exercise:     [x]  No -tire easily Hypoglycemic episodes?: []  Yes    [x]  No Numbness of the feet? [x]  Yes    []  No Retinopathy hx? []  Yes    []  No Last eye exam: over due Comments:  Hx of lung cancer:  No cough but some SOB with exertion as mentioned above. . Patient Active Problem List   Diagnosis Date Noted  . Malignant neoplasm of hilus of right lung ([place]) 01/20/2020  . Paroxysmal atrial fibrillation ([place]) 09/06/2018  . Microalbuminuria 09/05/2017  . S/P CABG  x 4 03/23/2017  . Coronary artery disease involving native coronary artery of native heart with angina pectoris ([place]) 03/17/2017  . Ischemic cardiomyopathy 03/17/2017  . Non-STEMI (non-ST elevated myocardial infarction) ([place]) 03/16/2017  . Poor social situation 10/05/2015  . History of lung cancer 08/08/2011  . DM2 (diabetes mellitus, type 2) ([place]) 08/03/2011  . Hyperlipidemia with target low density lipoprotein (LDL) cholesterol less than 70 mg/dL   . Diabetic neuropathy, type II diabetes mellitus ([place])   . Hepatic steatosis      Current Outpatient Medications on File Prior to Visit  Medication Sig Dispense Refill  . Accu-Chek Softclix Lancets lancets Use as instructed to test blood sugar three times daily. 100 each 12  . aspirin 325 MG EC tablet Take 1 tablet (325 mg total) by mouth daily. 100 tablet 1  . carvedilol (COREG) 12.5 MG tablet Take 1 tablet (12.5 mg total) by  mouth 2 (two) times daily with a meal. 180 tablet 1  . gabapentin (NEURONTIN) 300 MG capsule Take 1 capsule (300 mg total) by mouth 3 (three) times daily. 270 capsule 3  . glucose blood (ACCU-CHEK AVIVA PLUS) test strip Use as instructed to test blood sugar three times daily. 100 each 12  . metFORMIN (GLUCOPHAGE) 1000 MG tablet Take 1 tablet (1,000 mg total) by mouth 2 (two) times daily with a meal. 180 tablet 1   No current facility-administered medications on file prior to visit.    Allergies  Allergen Reactions  . Lisinopril     Leg weakness  . No Known Allergies     Social History   Socioeconomic History  . Marital status: Married    Spouse name: Not on file  . Number of children: Not on file  . Years of education: Not on file  . Highest education level: Not on file  Occupational History    Employer: UNEMPLOYED  Tobacco Use  . Smoking status: Former Smoker    Quit date: 05/26/2008    Years since quitting: 11.6  . Smokeless tobacco: Never Used  Substance and Sexual Activity  . Alcohol use: Yes    Comment: 1 can of beer occasionally  . Drug use: No  . Sexual activity: Not on file  Other Topics Concern  . Not on file  Social History Narrative  . Not on file   Social Determinants of Health   Financial Resource Strain:   . Difficulty of Paying Living Expenses:   Food Insecurity:   . Worried About Charity fundraiser in the Last Year:   . Arboriculturist in the Last Year:   Transportation Needs:   . Film/video editor (Medical):   Marland Kitchen Lack of Transportation (Non-Medical):   Physical Activity:   . Days of Exercise per Week:   . Minutes of Exercise per Session:   Stress:   . Feeling of Stress :   Social Connections:   . Frequency of Communication with Friends and Family:   . Frequency of Social Gatherings with Friends and Family:   . Attends Religious Services:   . Active Member of Clubs or Organizations:   . Attends Archivist Meetings:   Marland Kitchen Marital  Status:   Intimate Partner Violence:   . Fear of Current or Ex-Partner:   . Emotionally Abused:   Marland Kitchen Physically Abused:   . Sexually Abused:     Family History  Problem Relation Age of Onset  . CAD Neg Hx   . Hypertension Neg Hx   .  Diabetes Neg Hx     Past Surgical History:  Procedure Laterality Date  . CORONARY ARTERY BYPASS GRAFT N/A 03/23/2017   Procedure: CORONARY ARTERY BYPASS GRAFTING (CABG) x , four using left internal mammary artery and right greater saphenous vein harvested endoscopically;  Surgeon: Ivin Poot, MD;  Location: [place];  Service: Open Heart Surgery;  Laterality: N/A;  . ENDOVEIN HARVEST OF GREATER SAPHENOUS VEIN Right 03/23/2017   Procedure: ENDOVEIN HARVEST OF GREATER SAPHENOUS VEIN;  Surgeon: Ivin Poot, MD;  Location: [place];  Service: Open Heart Surgery;  Laterality: Right;  . LEFT HEART CATH AND CORONARY ANGIOGRAPHY Left 03/16/2017   Procedure: Left Heart Cath and Coronary Angiography;  Surgeon: Isaias Cowman, MD;  Location: [place] [facility];  Service: Cardiovascular;  Laterality: Left;  . none    . RIGHT HEART CATH N/A 03/21/2017   Procedure: Right Heart Cath;  Surgeon: Larey Dresser, MD;  Location: [place] [facility];  Service: Cardiovascular;  Laterality: N/A;  . TEE WITHOUT CARDIOVERSION N/A 03/23/2017   Procedure: TRANSESOPHAGEAL ECHOCARDIOGRAM (TEE);  Surgeon: Prescott Gum, Collier Salina, MD;  Location: [place];  Service: Open Heart Surgery;  Laterality: N/A;    ROS: Review of Systems Negative except as stated above  PHYSICAL EXAM: BP 126/73   Pulse 96   Temp 97.7 F (36.5 C)   Resp 16   Wt 192 lb 6.4 oz (87.3 kg)   SpO2 99%   BMI 31.05 kg/m   Physical Exam  General appearance - alert, well appearing, and in no distress Mental status - normal mood, behavior, speech, dress, motor activity, and thought processes Mouth - mucous membranes moist, pharynx normal without lesions Neck - supple, no significant adenopathy Chest - clear  to auscultation, no wheezes, rales or rhonchi, symmetric air entry Heart - normal rate, regular rhythm, normal S1, S2, no murmurs, rubs, clicks or gallops Extremities - peripheral pulses normal, no pedal edema, no clubbing or cyanosis  Diabetic Foot Exam - Simple   Simple Foot Form Visual Inspection No deformities, no ulcerations, no other skin breakdown bilaterally: Yes Sensation Testing Intact to touch and monofilament testing bilaterally: Yes Pulse Check Posterior Tibialis and Dorsalis pulse intact bilaterally: Yes Comments     CMP Latest Ref Rng & Units 06/05/2019 04/04/2018 06/07/2017  Glucose 65 - 99 mg/dL 137(H) 343(H) 276(H)  BUN 6 - 24 mg/dL 21 26(H) 19  Creatinine 0.76 - 1.27 mg/dL 0.95 0.97 0.97  Sodium 134 - 144 mmol/L 142 138 138  Potassium 3.5 - 5.2 mmol/L 3.7 4.8 4.3  Chloride 96 - 106 mmol/L 101 99 99  CO2 20 - 29 mmol/L 23 21 24   Calcium 8.7 - 10.2 mg/dL 9.3 9.6 10.2  Total Protein 6.0 - 8.5 g/dL 7.5 8.0 7.5  Total Bilirubin 0.0 - 1.2 mg/dL 0.3 0.4 0.3  Alkaline Phos 39 - 117 IU/L 46 66 63  AST 0 - 40 IU/L 25 23 10   ALT 0 - 44 IU/L 50(H) 65(H) 23   Lipid Panel     Component Value Date/Time   CHOL 192 04/04/2018 0910   TRIG 152 (H) 04/04/2018 0910   HDL 38 (L) 04/04/2018 0910   CHOLHDL 5.1 (H) 04/04/2018 0910   CHOLHDL 4.4 03/18/2017 0428   VLDL 37 03/18/2017 0428   LDLCALC 124 (H) 04/04/2018 0910    CBC    Component Value Date/Time   WBC 10.8 04/04/2018 0910   WBC 8.6 03/30/2017 0437   RBC 5.56 04/04/2018 0910  RBC 2.91 (L) 03/30/2017 0437   HGB 16.2 04/04/2018 0910   HGB 14.5 04/18/2016 1013   HCT 49.8 04/04/2018 0910   HCT 44.8 04/18/2016 1013   PLT 170 04/04/2018 0910   MCV 90 04/04/2018 0910   MCV 90.6 04/18/2016 1013   MCH 29.1 04/04/2018 0910   MCH 28.2 03/30/2017 0437   MCHC 32.5 04/04/2018 0910   MCHC 32.3 03/30/2017 0437   RDW 14.8 04/04/2018 0910   RDW 13.8 04/18/2016 1013   LYMPHSABS 1.8 04/18/2016 1013   MONOABS 0.5 04/18/2016  1013   EOSABS 1.3 (H) 04/18/2016 1013   BASOSABS 0.1 04/18/2016 1013    ASSESSMENT AND PLAN: 1. Type 2 diabetes mellitus with other circulatory complication, with long-term current use of insulin (HCC) Not at goal.  He has been out of Jardiance but does not know for how long.  It appears he is also not been as consistent in taking Metformin as he say he use.  Discussed and encourage compliance with medications.  Continue Lantus and NovoLog.  Encourage him to walk as tolerated Submit referral for eye exam. - POCT glucose (manual entry) - POCT glycosylated hemoglobin (Hb A1C) - CBC - Comprehensive metabolic panel - Lipid panel - empagliflozin (JARDIANCE) 10 MG TABS tablet; Take 1 tablet (10 mg total) by mouth daily.  Dispense: 90 tablet; Refill: 1 - insulin glargine (LANTUS) 100 UNIT/ML injection; 63 units subcut in a.m and 63 units in p.m.  Dispense: 30 mL; Refill: 6 - insulin aspart (NOVOLOG) 100 UNIT/ML injection; Inject 32 units into the skin 3 times daily with meals.  Dispense: 70 mL; Refill: 6  2. Essential hypertension At goal.  Lisinopril taken off med list as he has not been taking it due to it causing leg discomfort for him  3. Coronary artery disease of native artery of native heart with stable angina pectoris ([place]) Followed by cardiology.  Continue aspirin, atorvastatin and carvedilol - atorvastatin (LIPITOR) 80 MG tablet; Take 1 tablet (80 mg total) by mouth daily at 6 PM.  Dispense: 30 tablet; Refill: 2  4. History of lung cancer Recently had cardiac testing as part of work-up for progressive dyspnea.  Given his history of lung cancer, I will do a chest x-ray - DG Chest 2 View; Future  5. DOE (dyspnea on exertion) See #4 above - DG Chest 2 View; Future  6. Other insomnia Patient complained of insomnia.  We will try him with low-dose melatonin.  Good sleep hygiene discussed and encouraged  7. Paroxysmal atrial fibrillation (HCC) This has not been an issue and he has  not had any recurrence.  Currently not on blood thinner  8. Screening for colon cancer - Ambulatory referral to Gastroenterology  9. Prostate cancer screening Wanted to check a PSA level but this is not covered by his insurance  Form completed for handicap sticker.  Patient was given the opportunity to ask questions.  Patient verbalized understanding of the plan and was able to repeat key elements of the plan.  [employer] interpreter used during this encounter. #[id number]  Orders Placed This Encounter  Procedures  . DG Chest 2 View  . CBC  . Comprehensive metabolic panel  . Lipid panel  . Ambulatory referral to Gastroenterology  . POCT glucose (manual entry)  . POCT glycosylated hemoglobin (Hb A1C)     Requested Prescriptions   Signed Prescriptions Disp Refills  . furosemide (LASIX) 20 MG tablet 90 tablet 1    Sig: 1 tab PO daily as  needed for swelling in legs  . empagliflozin (JARDIANCE) 10 MG TABS tablet 90 tablet 1    Sig: Take 1 tablet (10 mg total) by mouth daily.  . insulin glargine (LANTUS) 100 UNIT/ML injection 30 mL 6    Sig: 63 units subcut in a.m and 63 units in p.m.  Marland Kitchen atorvastatin (LIPITOR) 80 MG tablet 30 tablet 2    Sig: Take 1 tablet (80 mg total) by mouth daily at 6 PM.  . insulin aspart (NOVOLOG) 100 UNIT/ML injection 70 mL 6    Sig: Inject 32 units into the skin 3 times daily with meals.    Return in about 4 months (around 05/22/2020).  Karle Plumber, MD, FACP

## 2020-01-20 NOTE — Progress Notes (Signed)
.  1Pt states he is having issues with sleeping  Pt states he was unable to sleep last night

## 2020-01-21 ENCOUNTER — Other Ambulatory Visit: Payer: Self-pay | Admitting: Internal Medicine

## 2020-01-21 DIAGNOSIS — R7989 Other specified abnormal findings of blood chemistry: Secondary | ICD-10-CM

## 2020-01-21 LAB — COMPREHENSIVE METABOLIC PANEL
ALT: 111 IU/L — ABNORMAL HIGH (ref 0–44)
AST: 66 IU/L — ABNORMAL HIGH (ref 0–40)
Albumin/Globulin Ratio: 1.5 (ref 1.2–2.2)
Albumin: 4.8 g/dL (ref 3.8–4.9)
Alkaline Phosphatase: 60 IU/L (ref 48–121)
BUN/Creatinine Ratio: 22 — ABNORMAL HIGH (ref 9–20)
BUN: 23 mg/dL (ref 6–24)
Bilirubin Total: 0.5 mg/dL (ref 0.0–1.2)
CO2: 19 mmol/L — ABNORMAL LOW (ref 20–29)
Calcium: 9.8 mg/dL (ref 8.7–10.2)
Chloride: 103 mmol/L (ref 96–106)
Creatinine, Ser: 1.05 mg/dL (ref 0.76–1.27)
GFR calc Af Amer: 89 mL/min/{1.73_m2} (ref >59)
GFR calc non Af Amer: 77 mL/min/{1.73_m2} (ref >59)
Globulin, Total: 3.3 g/dL (ref 1.5–4.5)
Glucose: 250 mg/dL — ABNORMAL HIGH (ref 65–99)
Potassium: 4.9 mmol/L (ref 3.5–5.2)
Sodium: 141 mmol/L (ref 134–144)
Total Protein: 8.1 g/dL (ref 6.0–8.5)

## 2020-01-21 LAB — LIPID PANEL
Chol/HDL Ratio: 4.2 ratio (ref 0.0–5.0)
Cholesterol, Total: 157 mg/dL (ref 100–199)
HDL: 37 mg/dL — ABNORMAL LOW (ref >39)
LDL Chol Calc (NIH): 89 mg/dL (ref 0–99)
Triglycerides: 179 mg/dL — ABNORMAL HIGH (ref 0–149)
VLDL Cholesterol Cal: 31 mg/dL (ref 5–40)

## 2020-01-21 LAB — CBC
Hematocrit: 44.3 % (ref 37.5–51.0)
Hemoglobin: 14 g/dL (ref 13.0–17.7)
MCH: 28.7 pg (ref 26.6–33.0)
MCHC: 31.6 g/dL (ref 31.5–35.7)
MCV: 91 fL (ref 79–97)
Platelets: 161 10*3/uL (ref 150–450)
RBC: 4.88 x10E6/uL (ref 4.14–5.80)
RDW: 14.5 % (ref 11.6–15.4)
WBC: 7.9 10*3/uL (ref 3.4–10.8)

## 2020-01-21 NOTE — Progress Notes (Signed)
Let patient know that his liver enzymes are abnormal.  This is likely due to the recent increase in the dose of atorvastatin his cholesterol medication from 40 mg daily to 80 mg daily.  Tell him to stop the atorvastatin then return to the laboratory in 2 weeks for repeat check of his liver enzymes.  I have entered the lab order.  If the liver enzymes have normalized on the repeat test in 2 weeks, then he can restart the atorvastatin but take only half a pill daily. His chest x-ray revealed no new changes. Please make sure that patient gets these lab results.

## 2020-01-27 DIAGNOSIS — I255 Ischemic cardiomyopathy: Secondary | ICD-10-CM | POA: Diagnosis not present

## 2020-01-27 DIAGNOSIS — I10 Essential (primary) hypertension: Secondary | ICD-10-CM | POA: Diagnosis not present

## 2020-01-27 DIAGNOSIS — I2510 Atherosclerotic heart disease of native coronary artery without angina pectoris: Secondary | ICD-10-CM | POA: Diagnosis not present

## 2020-01-27 DIAGNOSIS — E1169 Type 2 diabetes mellitus with other specified complication: Secondary | ICD-10-CM | POA: Diagnosis not present

## 2020-01-27 DIAGNOSIS — Z794 Long term (current) use of insulin: Secondary | ICD-10-CM | POA: Diagnosis not present

## 2020-01-27 DIAGNOSIS — R0600 Dyspnea, unspecified: Secondary | ICD-10-CM | POA: Diagnosis not present

## 2020-01-27 DIAGNOSIS — Z951 Presence of aortocoronary bypass graft: Secondary | ICD-10-CM | POA: Diagnosis not present

## 2020-01-28 ENCOUNTER — Other Ambulatory Visit: Payer: Self-pay

## 2020-01-28 ENCOUNTER — Other Ambulatory Visit: Payer: Self-pay | Admitting: Internal Medicine

## 2020-01-28 DIAGNOSIS — E1159 Type 2 diabetes mellitus with other circulatory complications: Secondary | ICD-10-CM

## 2020-01-28 DIAGNOSIS — Z794 Long term (current) use of insulin: Secondary | ICD-10-CM

## 2020-01-28 MED ORDER — INSULIN GLARGINE 100 UNIT/ML ~~LOC~~ SOLN: SUBCUTANEOUS | 0 refills | Status: DC

## 2020-01-28 MED ORDER — INSULIN ASPART 100 UNIT/ML ~~LOC~~ SOLN: SUBCUTANEOUS | 0 refills | Status: AC

## 2020-01-28 MED FILL — LANTUS 100 UNITS/ML VIAL: 100 | 23 days supply | Qty: 30 | Fill #0

## 2020-01-28 MED FILL — NovoLOG 100 UNIT/ML SOLN: 100 | 30 days supply | Qty: 30 | Fill #0

## 2020-01-28 NOTE — Telephone Encounter (Signed)
Pt came into the office today to request a refill on his lantus and novolog. Pt states he seen his pcp on 5/25 and refills were sent for medications to [facility] in [place]. Pt states he has a lot of appointments here in [place] and would like his insulin sent to [facility]. Sent his novolog and lantus to [facility] pharmacy with no refills.

## 2020-01-30 ENCOUNTER — Other Ambulatory Visit: Payer: Self-pay

## 2020-01-30 ENCOUNTER — Ambulatory Visit (AMBULATORY_SURGERY_CENTER): Payer: Self-pay

## 2020-01-30 VITALS — Ht 67.0 in | Wt 190.0 lb

## 2020-01-30 DIAGNOSIS — Z1211 Encounter for screening for malignant neoplasm of colon: Secondary | ICD-10-CM

## 2020-01-30 NOTE — Progress Notes (Signed)
No egg or soy allergy known to patient  No issues with past sedation with any surgeries  or procedures, no intubation problems  No diet pills per patient No home 02 use per patient  No blood thinners per patient  Pt has issues with constipation, pt to begin 2 scoops No A fib or A flutter  EMMI video sent to pt's e mail   Interpreter with Pre-Visit  Pt is vaccinated for Covid.  Due to the COVID-19 pandemic we are asking patients to follow these guidelines. Please only bring one care partner. Please be aware that your care partner may wait in the car in the parking lot or if they feel like they will be too hot to wait in the car, they may wait in the lobby on the 4th floor. All care partners are required to wear a mask the entire time (we do not have any that we can provide them), they need to practice social distancing, and we will do a Covid check for all patient's and care partners when you arrive. Also we will check their temperature and your temperature. If the care partner waits in their car they need to stay in the parking lot the entire time and we will call them on their cell phone when the patient is ready for discharge so they can bring the car to the front of the building. Also all patient's will need to wear a mask into building.

## 2020-02-06 ENCOUNTER — Ambulatory Visit: Payer: Medicare Other | Attending: Internal Medicine

## 2020-02-06 ENCOUNTER — Other Ambulatory Visit: Payer: Self-pay

## 2020-02-06 DIAGNOSIS — R7989 Other specified abnormal findings of blood chemistry: Secondary | ICD-10-CM

## 2020-02-06 DIAGNOSIS — R945 Abnormal results of liver function studies: Secondary | ICD-10-CM | POA: Diagnosis not present

## 2020-02-07 LAB — HEPATITIS PANEL, ACUTE
Hep A IgM: NEGATIVE
Hep B C IgM: NEGATIVE
Hep C Virus Ab: <0.1 s/co ratio (ref 0.0–0.9)
Hepatitis B Surface Ag: NEGATIVE

## 2020-02-07 LAB — HEPATIC FUNCTION PANEL
ALT: 117 IU/L — ABNORMAL HIGH (ref 0–44)
AST: 59 IU/L — ABNORMAL HIGH (ref 0–40)
Albumin: 4.8 g/dL (ref 3.8–4.9)
Alkaline Phosphatase: 59 IU/L (ref 48–121)
Bilirubin Total: 0.4 mg/dL (ref 0.0–1.2)
Bilirubin, Direct: 0.14 mg/dL (ref 0.00–0.40)
Total Protein: 7.8 g/dL (ref 6.0–8.5)

## 2020-02-11 ENCOUNTER — Ambulatory Visit (AMBULATORY_SURGERY_CENTER): Payer: Medicare Other | Admitting: Internal Medicine

## 2020-02-11 ENCOUNTER — Other Ambulatory Visit: Payer: Self-pay

## 2020-02-11 ENCOUNTER — Encounter: Payer: Self-pay | Admitting: Internal Medicine

## 2020-02-11 VITALS — BP 141/83 | HR 96 | Temp 98.4°F | Resp 18 | Ht 67.0 in | Wt 190.0 lb

## 2020-02-11 DIAGNOSIS — D123 Benign neoplasm of transverse colon: Secondary | ICD-10-CM

## 2020-02-11 DIAGNOSIS — Z1211 Encounter for screening for malignant neoplasm of colon: Secondary | ICD-10-CM | POA: Diagnosis not present

## 2020-02-11 DIAGNOSIS — D124 Benign neoplasm of descending colon: Secondary | ICD-10-CM

## 2020-02-11 DIAGNOSIS — D120 Benign neoplasm of cecum: Secondary | ICD-10-CM

## 2020-02-11 MED ORDER — SODIUM CHLORIDE 0.9 % IV SOLN
500.0000 mL | Freq: Once | INTRAVENOUS | Status: DC
Start: 2020-02-11 — End: 2020-02-11

## 2020-02-11 NOTE — Progress Notes (Signed)
Vitals-CW ?

## 2020-02-11 NOTE — Op Note (Signed)
[facility] Patient Name: George Griffin Procedure Date: 02/11/2020 1:13 PM MRN: [id number] Endoscopist: Gatha Mayer , MD Age: 59 Referring MD:  Date of Birth: May 21, 1961 Gender: Male Account #: [id number] Procedure:                Colonoscopy Indications:              Screening for colorectal malignant neoplasm Medicines:                Propofol per Anesthesia, Monitored Anesthesia Care Procedure:                Pre-Anesthesia Assessment:                           - Prior to the procedure, a History and Physical                            was performed, and patient medications and                            allergies were reviewed. The patient's tolerance of                            previous anesthesia was also reviewed. The risks                            and benefits of the procedure and the sedation                            options and risks were discussed with the patient.                            All questions were answered, and informed consent                            was obtained. Prior Anticoagulants: The patient has                            taken no previous anticoagulant or antiplatelet                            agents. ASA Grade Assessment: III - A patient with                            severe systemic disease. After reviewing the risks                            and benefits, the patient was deemed in                            satisfactory condition to undergo the procedure.                           After obtaining informed consent, the colonoscope  was passed under direct vision. Throughout the                            procedure, the patient's blood pressure, pulse, and                            oxygen saturations were monitored continuously. The                            Colonoscope was introduced through the anus and                            advanced to the the cecum, identified by                             appendiceal orifice and ileocecal valve. The                            colonoscopy was performed without difficulty. The                            patient tolerated the procedure well. The quality                            of the bowel preparation was good. The ileocecal                            valve, appendiceal orifice, and rectum were                            photographed. The bowel preparation used was                            Miralax via split dose instruction. Scope In: 1:28:53 PM Scope Out: 1:45:46 PM Scope Withdrawal Time: 0 hours 11 minutes 59 seconds  Total Procedure Duration: 0 hours 16 minutes 53 seconds  Findings:                 The perianal and digital rectal examinations were                            normal.                           Three sessile polyps were found in the transverse                            colon, ascending colon and cecum. The polyps were 1                            to 2 mm in size. These polyps were removed with a                            cold biopsy forceps. Resection and retrieval were  complete. Verification of patient identification                            for the specimen was done. Estimated blood loss was                            minimal.                           The exam was otherwise without abnormality on                            direct and retroflexion views. Complications:            No immediate complications. Estimated Blood Loss:     Estimated blood loss was minimal. Impression:               - Three 1 to 2 mm polyps in the transverse colon,                            in the ascending colon and in the cecum, removed                            with a cold biopsy forceps. Resected and retrieved.                           - The examination was otherwise normal on direct                            and retroflexion views. Recommendation:           - Patient has a contact number available for                             emergencies. The signs and symptoms of potential                            delayed complications were discussed with the                            patient. Return to normal activities tomorrow.                            Written discharge instructions were provided to the                            patient.                           - Resume previous diet.                           - Continue present medications.                           - Repeat colonoscopy is recommended. The  colonoscopy date will be determined after pathology                            results from today's exam become available for                            review. Gatha Mayer, MD 02/11/2020 1:51:14 PM This report has been signed electronically.

## 2020-02-11 NOTE — Progress Notes (Signed)
Pt.  Showed this morning in admitting with unfinished 1/2 of prep,made DR. Aware and pt. Given option of going home completing prep and returning for afternoon procedure.

## 2020-02-11 NOTE — Patient Instructions (Addendum)
I found and removed 3 tiny polyps.  I will let you know pathology results and when to have another routine colonoscopy by mail and/or My Chart.  I appreciate the opportunity to care for you. Gatha Mayer, MD, [facility]  Handouts given:  Polyps Resume previous diet Continue present medications await pathlogy YOU HAD AN ENDOSCOPIC PROCEDURE TODAY AT [facility]:   Refer to the procedure report that was given to you for any specific questions about what was found during the examination.  If the procedure report does not answer your questions, please call your gastroenterologist to clarify.  If you requested that your care partner not be given the details of your procedure findings, then the procedure report has been included in a sealed envelope for you to review at your convenience later.  YOU SHOULD EXPECT: Some feelings of bloating in the abdomen. Passage of more gas than usual.  Walking can help get rid of the air that was put into your GI tract during the procedure and reduce the bloating. If you had a lower endoscopy (such as a colonoscopy or flexible sigmoidoscopy) you may notice spotting of blood in your stool or on the toilet paper. If you underwent a bowel prep for your procedure, you may not have a normal bowel movement for a few days.  Please Note:  You might notice some irritation and congestion in your nose or some drainage.  This is from the oxygen used during your procedure.  There is no need for concern and it should clear up in a day or so.  SYMPTOMS TO REPORT IMMEDIATELY:   Following lower endoscopy (colonoscopy or flexible sigmoidoscopy):  Excessive amounts of blood in the stool  Significant tenderness or worsening of abdominal pains  Swelling of the abdomen that is new, acute  Fever of 100F or higher  For urgent or emergent issues, a gastroenterologist can be reached at any hour by calling [phone]. Do not use MyChart messaging for urgent concerns.     DIET:  We do recommend a small meal at first, but then you may proceed to your regular diet.  Drink plenty of fluids but you should avoid alcoholic beverages for 24 hours.  ACTIVITY:  You should plan to take it easy for the rest of today and you should NOT DRIVE or use heavy machinery until tomorrow (because of the sedation medicines used during the test).    FOLLOW UP: Our staff will call the number listed on your records 48-72 hours following your procedure to check on you and address any questions or concerns that you may have regarding the information given to you following your procedure. If we do not reach you, we will leave a message.  We will attempt to reach you two times.  During this call, we will ask if you have developed any symptoms of COVID 19. If you develop any symptoms (ie: fever, flu-like symptoms, shortness of breath, cough etc.) before then, please call [phone].  If you test positive for Covid 19 in the 2 weeks post procedure, please call and report this information to [place].    If any biopsies were taken you will be contacted by phone or by letter within the next 1-3 weeks.  Please call us at [phone] if you have not heard about the biopsies in 3 weeks.    SIGNATURES/CONFIDENTIALITY: You and/or your care partner have signed paperwork which will be entered into your electronic medical record.  These signatures attest  to the fact that that the information above on your After Visit Summary has been reviewed and is understood.  Full responsibility of the confidentiality of this discharge information lies with you and/or your care-partner.

## 2020-02-11 NOTE — Progress Notes (Signed)
Report to PACU, RN, vss, BBS= Clear.  

## 2020-02-11 NOTE — Progress Notes (Signed)
Called to room to assist during endoscopic procedure.  Patient ID and intended procedure confirmed with present staff. Received instructions for my participation in the procedure from the performing physician.  

## 2020-02-13 ENCOUNTER — Telehealth: Payer: Self-pay | Admitting: *Deleted

## 2020-02-13 ENCOUNTER — Telehealth: Payer: Self-pay

## 2020-02-13 NOTE — Telephone Encounter (Signed)
2nd follow up call made.  NALM 

## 2020-02-13 NOTE — Telephone Encounter (Signed)
First attempt, left VM.  

## 2020-02-17 ENCOUNTER — Encounter: Payer: Self-pay | Admitting: Internal Medicine

## 2020-02-17 DIAGNOSIS — Z860101 Personal history of adenomatous and serrated colon polyps: Secondary | ICD-10-CM | POA: Insufficient documentation

## 2020-02-17 DIAGNOSIS — Z86010 Personal history of colonic polyps: Secondary | ICD-10-CM

## 2020-02-17 HISTORY — DX: Personal history of colonic polyps: Z86.010

## 2020-02-17 HISTORY — DX: Personal history of adenomatous and serrated colon polyps: Z86.0101

## 2020-02-23 ENCOUNTER — Ambulatory Visit: Payer: Medicare Other | Admitting: Internal Medicine

## 2020-03-11 ENCOUNTER — Encounter: Payer: Self-pay | Admitting: Internal Medicine

## 2020-03-11 ENCOUNTER — Ambulatory Visit: Payer: Medicare Other | Attending: Internal Medicine | Admitting: Internal Medicine

## 2020-03-11 ENCOUNTER — Other Ambulatory Visit: Payer: Self-pay

## 2020-03-11 VITALS — BP 127/77 | HR 98 | Resp 16 | Wt 186.2 lb

## 2020-03-11 DIAGNOSIS — Z87891 Personal history of nicotine dependence: Secondary | ICD-10-CM | POA: Diagnosis not present

## 2020-03-11 DIAGNOSIS — Z951 Presence of aortocoronary bypass graft: Secondary | ICD-10-CM | POA: Insufficient documentation

## 2020-03-11 DIAGNOSIS — E1140 Type 2 diabetes mellitus with diabetic neuropathy, unspecified: Secondary | ICD-10-CM | POA: Insufficient documentation

## 2020-03-11 DIAGNOSIS — I10 Essential (primary) hypertension: Secondary | ICD-10-CM | POA: Diagnosis not present

## 2020-03-11 DIAGNOSIS — I480 Paroxysmal atrial fibrillation: Secondary | ICD-10-CM | POA: Diagnosis not present

## 2020-03-11 DIAGNOSIS — R7989 Other specified abnormal findings of blood chemistry: Secondary | ICD-10-CM

## 2020-03-11 DIAGNOSIS — E1159 Type 2 diabetes mellitus with other circulatory complications: Secondary | ICD-10-CM | POA: Diagnosis not present

## 2020-03-11 DIAGNOSIS — Z794 Long term (current) use of insulin: Secondary | ICD-10-CM | POA: Insufficient documentation

## 2020-03-11 DIAGNOSIS — K760 Fatty (change of) liver, not elsewhere classified: Secondary | ICD-10-CM | POA: Diagnosis not present

## 2020-03-11 DIAGNOSIS — Z7982 Long term (current) use of aspirin: Secondary | ICD-10-CM | POA: Insufficient documentation

## 2020-03-11 DIAGNOSIS — I25119 Atherosclerotic heart disease of native coronary artery with unspecified angina pectoris: Secondary | ICD-10-CM | POA: Diagnosis not present

## 2020-03-11 DIAGNOSIS — I255 Ischemic cardiomyopathy: Secondary | ICD-10-CM | POA: Diagnosis not present

## 2020-03-11 DIAGNOSIS — Z888 Allergy status to other drugs, medicaments and biological substances status: Secondary | ICD-10-CM | POA: Diagnosis not present

## 2020-03-11 DIAGNOSIS — R945 Abnormal results of liver function studies: Secondary | ICD-10-CM

## 2020-03-11 DIAGNOSIS — Z79899 Other long term (current) drug therapy: Secondary | ICD-10-CM | POA: Insufficient documentation

## 2020-03-11 DIAGNOSIS — E785 Hyperlipidemia, unspecified: Secondary | ICD-10-CM | POA: Insufficient documentation

## 2020-03-11 LAB — GLUCOSE, POCT (MANUAL RESULT ENTRY)
POC Glucose: 69 mg/dl — AB (ref 70–99)
POC Glucose: 133 mg/dl — AB (ref 70–99)

## 2020-03-11 NOTE — Progress Notes (Signed)
Patient ID: George Griffin, male    DOB: 04-May-1961  MRN: [id number]  CC: Diabetes and Hypertension   Subjective: George Griffin is a 59 y.o. male who presents for chronic ds management and abn LFTs ment.  His concerns today include:  Hx of DM with neuropathy, lung CA s/p chem/XRT HL, HTN, CAD/ICM s/p CABG x 4 with EF 45-50%, PAF.  Patient has his medication bottles with him.  Pt asked to f/u due to elev LFT. Ast/ALT elev on blood test done 12/2019.  About 1 mth before, Lipitor was increase from 40 mg daily to 80 mg daily by his cardiologist. Advised to hold Atorvastatin which he has done.  He has the atorvastatin bottle with him but states he has not been taking any of it since he received a phone call from [place] telling him to discontinue taking.  No abdominal pain or jaundice.  Acute hepatitis panel was negative.  I received note from his dentist wanting approval for him to have teeth extractions.  However, they want his A1C to be less than 8 to proceed. Checking BS in a.m and in evening after lunch.  He has his glucometer with him.  7 day avg 136, 14 day avg 142, and 30 day avg 149.  Range 83-234.  -has Jardiance with him today which he did not have on his last visit.  Reports compliance with Metformin, Lantus and NovoLog.    Patient Active Problem List   Diagnosis Date Noted  . Hx of adenomatous polyp of colon 02/17/2020  . Malignant neoplasm of hilus of right lung ([place]) 01/20/2020  . Essential hypertension 01/20/2020  . Other insomnia 01/20/2020  . Paroxysmal atrial fibrillation ([place]) 09/06/2018  . Microalbuminuria 09/05/2017  . S/P CABG x 4 03/23/2017  . Coronary artery disease involving native coronary artery of native heart with angina pectoris ([place]) 03/17/2017  . Ischemic cardiomyopathy 03/17/2017  . Non-STEMI (non-ST elevated myocardial infarction) ([place]) 03/16/2017  . Poor social situation 10/05/2015  . History of lung cancer 08/08/2011  . DM2 (diabetes mellitus, type 2) ([place])  08/03/2011  . Hyperlipidemia with target low density lipoprotein (LDL) cholesterol less than 70 mg/dL   . Diabetic neuropathy, type II diabetes mellitus ([place])   . Hepatic steatosis      Current Outpatient Medications on File Prior to Visit  Medication Sig Dispense Refill  . Accu-Chek Softclix Lancets lancets Use as instructed to test blood sugar three times daily. 100 each 12  . acetaminophen (TYLENOL) 325 MG tablet Take 650 mg by mouth every 6 (six) hours as needed.  (Patient not taking: Reported on 02/11/2020)    . albuterol (VENTOLIN HFA) 108 (90 Base) MCG/ACT inhaler Inhale into the lungs.    Marland Kitchen aspirin 325 MG EC tablet Take 1 tablet (325 mg total) by mouth daily. (Patient not taking: Reported on 01/30/2020) 100 tablet 1  . atorvastatin (LIPITOR) 80 MG tablet Take 1 tablet (80 mg total) by mouth daily at 6 PM. 30 tablet 2  . carvedilol (COREG) 12.5 MG tablet Take 1 tablet (12.5 mg total) by mouth 2 (two) times daily with a meal. 180 tablet 1  . empagliflozin (JARDIANCE) 10 MG TABS tablet Take 1 tablet (10 mg total) by mouth daily. 90 tablet 1  . furosemide (LASIX) 20 MG tablet 1 tab PO daily as needed for swelling in legs (Patient not taking: Reported on 01/30/2020) 90 tablet 1  . gabapentin (NEURONTIN) 300 MG capsule Take 1 capsule (300 mg total) by  mouth 3 (three) times daily. 270 capsule 3  . glucose blood (ACCU-CHEK AVIVA PLUS) test strip Use as instructed to test blood sugar three times daily. 100 each 12  . insulin aspart (NOVOLOG) 100 UNIT/ML injection Inject 32 units into the skin 3 times daily with meals. 70 mL 0  . insulin glargine (LANTUS) 100 UNIT/ML injection 63 units subcut in a.m and 63 units in p.m. 30 mL 0  . losartan (COZAAR) 25 MG tablet Take 25 mg by mouth daily.    . metFORMIN (GLUCOPHAGE) 1000 MG tablet Take 1 tablet (1,000 mg total) by mouth 2 (two) times daily with a meal. 180 tablet 1  . polyethylene glycol (MIRALAX / GLYCOLAX) 17 g packet Take 17 g by mouth 2 (two) times  daily.     No current facility-administered medications on file prior to visit.    Allergies  Allergen Reactions  . Lisinopril     Leg weakness    Social History   Socioeconomic History  . Marital status: Married    Spouse name: Not on file  . Number of children: Not on file  . Years of education: Not on file  . Highest education level: Not on file  Occupational History    Employer: UNEMPLOYED  Tobacco Use  . Smoking status: Former Smoker    Years: 28.00    Types: Cigarettes    Quit date: 05/26/2008    Years since quitting: 11.8  . Smokeless tobacco: Never Used  Vaping Use  . Vaping Use: Never used  Substance and Sexual Activity  . Alcohol use: Yes    Comment: 1 can of beer occasionally  . Drug use: No  . Sexual activity: Not on file  Other Topics Concern  . Not on file  Social History Narrative  . Not on file   Social Determinants of Health   Financial Resource Strain:   . Difficulty of Paying Living Expenses:   Food Insecurity:   . Worried About Charity fundraiser in the Last Year:   . Arboriculturist in the Last Year:   Transportation Needs:   . Film/video editor (Medical):   Marland Kitchen Lack of Transportation (Non-Medical):   Physical Activity:   . Days of Exercise per Week:   . Minutes of Exercise per Session:   Stress:   . Feeling of Stress :   Social Connections:   . Frequency of Communication with Friends and Family:   . Frequency of Social Gatherings with Friends and Family:   . Attends Religious Services:   . Active Member of Clubs or Organizations:   . Attends Archivist Meetings:   Marland Kitchen Marital Status:   Intimate Partner Violence:   . Fear of Current or Ex-Partner:   . Emotionally Abused:   Marland Kitchen Physically Abused:   . Sexually Abused:     Family History  Problem Relation Age of Onset  . CAD Neg Hx   . Hypertension Neg Hx   . Diabetes Neg Hx   . Colon cancer Neg Hx   . Colon polyps Neg Hx   . Esophageal cancer Neg Hx   . Rectal  cancer Neg Hx   . Stomach cancer Neg Hx     Past Surgical History:  Procedure Laterality Date  . CORONARY ARTERY BYPASS GRAFT N/A 03/23/2017   Procedure: CORONARY ARTERY BYPASS GRAFTING (CABG) x , four using left internal mammary artery and right greater saphenous vein harvested endoscopically;  Surgeon: Ivin Poot,  MD;  Location: [facility] OR;  Service: Open Heart Surgery;  Laterality: N/A;  . ENDOVEIN HARVEST OF GREATER SAPHENOUS VEIN Right 03/23/2017   Procedure: ENDOVEIN HARVEST OF GREATER SAPHENOUS VEIN;  Surgeon: Ivin Poot, MD;  Location: [facility];  Service: Open Heart Surgery;  Laterality: Right;  . LEFT HEART CATH AND CORONARY ANGIOGRAPHY Left 03/16/2017   Procedure: Left Heart Cath and Coronary Angiography;  Surgeon: Isaias Cowman, MD;  Location: [facility];  Service: Cardiovascular;  Laterality: Left;  . none    . RIGHT HEART CATH N/A 03/21/2017   Procedure: Right Heart Cath;  Surgeon: Larey Dresser, MD;  Location: [facility];  Service: Cardiovascular;  Laterality: N/A;  . TEE WITHOUT CARDIOVERSION N/A 03/23/2017   Procedure: TRANSESOPHAGEAL ECHOCARDIOGRAM (TEE);  Surgeon: Prescott Gum, Collier Salina, MD;  Location: [facility];  Service: Open Heart Surgery;  Laterality: N/A;    ROS: Review of Systems Negative except as stated above  PHYSICAL EXAM: BP 127/77   Pulse 98   Resp 16   Wt 186 lb 3.2 oz (84.5 kg)   SpO2 98%   BMI 29.16 kg/m   Physical Exam   General appearance - alert, well appearing, and in no distress Mental status - normal mood, behavior, speech, dress, motor activity, and thought processes Neck - supple, no significant adenopathy Chest - clear to auscultation, no wheezes, rales or rhonchi, symmetric air entry Heart - normal rate, regular rhythm, normal S1, S2, no murmurs, rubs, clicks or gallops Extremities - peripheral pulses normal, no pedal edema, no clubbing or cyanosis  CMP Latest Ref Rng & Units 02/06/2020 01/20/2020 06/05/2019  Glucose  65 - 99 mg/dL - 250(H) 137(H)  BUN 6 - 24 mg/dL - 23 21  Creatinine 0.76 - 1.27 mg/dL - 1.05 0.95  Sodium 134 - 144 mmol/L - 141 142  Potassium 3.5 - 5.2 mmol/L - 4.9 3.7  Chloride 96 - 106 mmol/L - 103 101  CO2 20 - 29 mmol/L - 19(L) 23  Calcium 8.7 - 10.2 mg/dL - 9.8 9.3  Total Protein 6.0 - 8.5 g/dL 7.8 8.1 7.5  Total Bilirubin 0.0 - 1.2 mg/dL 0.4 0.5 0.3  Alkaline Phos 48 - 121 IU/L 59 60 46  AST 0 - 40 IU/L 59(H) 66(H) 25  ALT 0 - 44 IU/L 117(H) 111(H) 50(H)   Lipid Panel     Component Value Date/Time   CHOL 157 01/20/2020 1105   TRIG 179 (H) 01/20/2020 1105   HDL 37 (L) 01/20/2020 1105   CHOLHDL 4.2 01/20/2020 1105   CHOLHDL 4.4 03/18/2017 0428   VLDL 37 03/18/2017 0428   LDLCALC 89 01/20/2020 1105    CBC    Component Value Date/Time   WBC 7.9 01/20/2020 1105   WBC 8.6 03/30/2017 0437   RBC 4.88 01/20/2020 1105   RBC 2.91 (L) 03/30/2017 0437   HGB 14.0 01/20/2020 1105   HGB 14.5 04/18/2016 1013   HCT 44.3 01/20/2020 1105   HCT 44.8 04/18/2016 1013   PLT 161 01/20/2020 1105   MCV 91 01/20/2020 1105   MCV 90.6 04/18/2016 1013   MCH 28.7 01/20/2020 1105   MCH 28.2 03/30/2017 0437   MCHC 31.6 01/20/2020 1105   MCHC 32.3 03/30/2017 0437   RDW 14.5 01/20/2020 1105   RDW 13.8 04/18/2016 1013   LYMPHSABS 1.8 04/18/2016 1013   MONOABS 0.5 04/18/2016 1013   EOSABS 1.3 (H) 04/18/2016 1013   BASOSABS 0.1 04/18/2016 1013    ASSESSMENT AND  PLAN: 1. Type 2 diabetes mellitus with other circulatory complication, with long-term current use of insulin (HCC) Blood sugars looking better.  We will check an A1c today.  If less than 8, I will go ahead and complete the form to allow him to have his dental procedure done - POCT glucose (manual entry) - POCT glucose (manual entry) - Hemoglobin A1c  2. Abnormal LFTs I suspect the elevation was due to the increase in atorvastatin.  We will recheck LFTs today.  If returned to baseline, I will have him restart atorvastatin at 80 mg  half tablet daily - Hepatic Function Panel     Patient was given the opportunity to ask questions.  Patient verbalized understanding of the plan and was able to repeat key elements of the plan.  [employer] interpreter used during this encounter. #[id number].     Orders Placed This Encounter  Procedures  . POCT glucose (manual entry)  . POCT glucose (manual entry)     Requested Prescriptions    No prescriptions requested or ordered in this encounter    No follow-ups on file.  Karle Plumber, MD, FACP

## 2020-03-12 LAB — HEPATIC FUNCTION PANEL
ALT: 84 IU/L — ABNORMAL HIGH (ref 0–44)
AST: 55 IU/L — ABNORMAL HIGH (ref 0–40)
Albumin: 4.8 g/dL (ref 3.8–4.9)
Alkaline Phosphatase: 56 IU/L (ref 48–121)
Bilirubin Total: 0.4 mg/dL (ref 0.0–1.2)
Bilirubin, Direct: 0.14 mg/dL (ref 0.00–0.40)
Total Protein: 8.1 g/dL (ref 6.0–8.5)

## 2020-03-12 LAB — HEMOGLOBIN A1C
Est. average glucose Bld gHb Est-mCnc: 194 mg/dL
Hgb A1c MFr Bld: 8.4 % — ABNORMAL HIGH (ref 4.8–5.6)

## 2020-03-14 NOTE — Progress Notes (Signed)
Let patient know that his liver function tests have decreased but are still mildly elevated.  He should continue to hold the medication called rosuvastatin.  I have submitted a referral for him to see a liver specialist.  His A1c is 8.4 which has improved.  I will go ahead and send the form into his dentist letting them know it is okay to proceed with dental procedure.  Continue to monitor blood sugars

## 2020-03-22 ENCOUNTER — Telehealth: Payer: Self-pay | Admitting: Internal Medicine

## 2020-03-22 NOTE — Telephone Encounter (Signed)
Mein from [employer] office receive note from [employer]- states handwritten and they are not sure if he can or cannot have extractions. States A1C below 8.1 but is 8.4 was around 12. States pt has been waiting a year and really worked hard, pls fu at [phone] for a clearer understanding of approval or not

## 2020-03-23 NOTE — Telephone Encounter (Signed)
Returned call to [employer] office and spoke to [place] and confirmed that Dr. Wynetta Emery states pt can have extractions and hold aspirin for 3 days prior

## 2020-03-28 ENCOUNTER — Other Ambulatory Visit: Payer: Self-pay | Admitting: Family Medicine

## 2020-03-28 DIAGNOSIS — E1159 Type 2 diabetes mellitus with other circulatory complications: Secondary | ICD-10-CM

## 2020-03-28 NOTE — Telephone Encounter (Signed)
Requested Prescriptions  Pending Prescriptions Disp Refills   insulin glargine (LANTUS) 100 UNIT/ML injection 30 mL 0    Sig: INJECT 63 UNITS SUBCUTANEOUSLY IN AM AND 63 UNITS IN PM     Endocrinology:  Diabetes - Insulins Failed - 03/28/2020 11:23 AM      Failed - HBA1C is between 0 and 7.9 and within 180 days    HbA1c, POC (controlled diabetic range)  Date Value Ref Range Status  01/20/2020 9.1 (A) 0.0 - 7.0 % Final   Hgb A1c MFr Bld  Date Value Ref Range Status  03/11/2020 8.4 (H) 4.8 - 5.6 % Final    Comment:             Prediabetes: 5.7 - 6.4          Diabetes: >6.4          Glycemic control for adults with diabetes: <7.0          Passed - Valid encounter within last 6 months    Recent Outpatient Visits          2 weeks ago Type 2 diabetes mellitus with other circulatory complication, with long-term current use of insulin ([place])   [place], Neoma Laming B, MD   2 months ago Type 2 diabetes mellitus with other circulatory complication, with long-term current use of insulin ([place])   [place], Neoma Laming B, MD   9 months ago Type 2 diabetes mellitus with other circulatory complication, with long-term current use of insulin ([place])   [place], Charlane Ferretti, MD   1 year ago Diabetes mellitus type 2, uncontrolled, with complications [facility])   [place], Deborah B, MD   1 year ago Diabetes mellitus type 2, uncontrolled, with complications [facility])   [place], [place]      Future Appointments            In 2 months Wynetta Emery Dalbert Batman, MD [place]

## 2020-04-24 ENCOUNTER — Other Ambulatory Visit: Payer: Self-pay | Admitting: Internal Medicine

## 2020-04-24 DIAGNOSIS — E1159 Type 2 diabetes mellitus with other circulatory complications: Secondary | ICD-10-CM

## 2020-05-31 ENCOUNTER — Ambulatory Visit: Payer: Medicare Other | Admitting: Internal Medicine

## 2020-06-26 DIAGNOSIS — Z23 Encounter for immunization: Secondary | ICD-10-CM | POA: Diagnosis not present

## 2020-09-08 ENCOUNTER — Other Ambulatory Visit: Payer: Self-pay | Admitting: Internal Medicine

## 2020-09-08 DIAGNOSIS — E1159 Type 2 diabetes mellitus with other circulatory complications: Secondary | ICD-10-CM

## 2021-05-13 ENCOUNTER — Other Ambulatory Visit (HOSPITAL_COMMUNITY): Payer: Self-pay
